# Patient Record
Sex: Male | Born: 1950 | Race: Black or African American | Hispanic: No | Marital: Married | Smoking: Never smoker
Health system: Southern US, Community
[De-identification: ages and names within clinical notes are randomized; demographics above are authoritative.]

## PROBLEM LIST (undated history)

## (undated) DIAGNOSIS — I10 Essential (primary) hypertension: Secondary | ICD-10-CM

## (undated) HISTORY — DX: Essential (primary) hypertension: I10

---

## 2000-07-05 ENCOUNTER — Emergency Department (HOSPITAL_COMMUNITY): Admission: EM | Admit: 2000-07-05 | Discharge: 2000-07-05 | Payer: Self-pay | Admitting: Emergency Medicine

## 2002-09-17 ENCOUNTER — Emergency Department (HOSPITAL_COMMUNITY): Admission: EM | Admit: 2002-09-17 | Discharge: 2002-09-17 | Payer: Self-pay | Admitting: Emergency Medicine

## 2002-09-26 ENCOUNTER — Emergency Department (HOSPITAL_COMMUNITY): Admission: EM | Admit: 2002-09-26 | Discharge: 2002-09-26 | Payer: Self-pay | Admitting: Emergency Medicine

## 2003-11-24 ENCOUNTER — Emergency Department (HOSPITAL_COMMUNITY): Admission: EM | Admit: 2003-11-24 | Discharge: 2003-11-24 | Payer: Self-pay | Admitting: Emergency Medicine

## 2004-09-20 ENCOUNTER — Emergency Department (HOSPITAL_COMMUNITY): Admission: EM | Admit: 2004-09-20 | Discharge: 2004-09-20 | Payer: Self-pay | Admitting: Emergency Medicine

## 2005-05-11 ENCOUNTER — Emergency Department (HOSPITAL_COMMUNITY): Admission: EM | Admit: 2005-05-11 | Discharge: 2005-05-11 | Payer: Self-pay | Admitting: Emergency Medicine

## 2005-07-30 ENCOUNTER — Emergency Department (HOSPITAL_COMMUNITY): Admission: EM | Admit: 2005-07-30 | Discharge: 2005-07-30 | Payer: Self-pay | Admitting: *Deleted

## 2014-07-17 ENCOUNTER — Ambulatory Visit (INDEPENDENT_AMBULATORY_CARE_PROVIDER_SITE_OTHER): Payer: Medicare PPO

## 2014-07-17 ENCOUNTER — Ambulatory Visit (INDEPENDENT_AMBULATORY_CARE_PROVIDER_SITE_OTHER): Payer: Medicare PPO | Admitting: Family Medicine

## 2014-07-17 DIAGNOSIS — S39012A Strain of muscle, fascia and tendon of lower back, initial encounter: Secondary | ICD-10-CM

## 2014-07-17 MED ORDER — METHOCARBAMOL 500 MG PO TABS: 500.0000 mg | ORAL_TABLET | Freq: Three times a day (TID) | ORAL | Status: DC | PRN
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
## 2014-07-17 NOTE — Patient Instructions (Signed)
I hope that you feel better soon!  Use the robaxin as needed for your back pain Get gentle exercise, and try some easy stretches Remember the robaxin may make you feel sleepy- avoid driving if you feel sleepy Let me know if you are not feeling better in the next 2-3 days- Sooner if worse.

## 2014-07-19 ENCOUNTER — Telehealth: Payer: Self-pay

## 2014-07-19 DIAGNOSIS — M62838 Other muscle spasm: Secondary | ICD-10-CM

## 2014-07-19 MED ORDER — TIZANIDINE HCL 2 MG PO CAPS: 2.0000 mg | ORAL_CAPSULE | Freq: Three times a day (TID) | ORAL | Status: DC | PRN
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
## 2014-07-19 NOTE — Telephone Encounter (Signed)
Called pt- counseled that his insurance does not cover robaxin.  The cash price may be quite inexpensive, so he could consider just paying for the drug.  Alternatively he can try zanaflex which is covered- whichever is cheaper.  Will send in the zanaflex as well and he can compare prices

## 2014-07-20 NOTE — Telephone Encounter (Signed)
PA for methocarbamol ended up getting approved by [facility]. Notified pharm and asked them to fill this instead of zanaflex (which also needed a PA) and let pt know when ready.

## 2018-05-29 ENCOUNTER — Inpatient Hospital Stay (HOSPITAL_COMMUNITY): Payer: Medicare Other

## 2018-05-29 ENCOUNTER — Inpatient Hospital Stay (HOSPITAL_COMMUNITY)
Admission: EM | Admit: 2018-05-29 | Discharge: 2018-06-05 | DRG: 062 | Disposition: A | Discharge status: Rehabilitation Facility | Payer: Medicare Other | Attending: Neurology | Admitting: Neurology

## 2018-05-29 ENCOUNTER — Emergency Department (HOSPITAL_COMMUNITY): Payer: Medicare Other

## 2018-05-29 ENCOUNTER — Other Ambulatory Visit: Payer: Self-pay

## 2018-05-29 ENCOUNTER — Encounter (HOSPITAL_COMMUNITY): Payer: Self-pay | Admitting: Emergency Medicine

## 2018-05-29 DIAGNOSIS — I63512 Cerebral infarction due to unspecified occlusion or stenosis of left middle cerebral artery: Secondary | ICD-10-CM | POA: Diagnosis present

## 2018-05-29 DIAGNOSIS — E1165 Type 2 diabetes mellitus with hyperglycemia: Secondary | ICD-10-CM | POA: Diagnosis present

## 2018-05-29 DIAGNOSIS — I161 Hypertensive emergency: Secondary | ICD-10-CM | POA: Diagnosis present

## 2018-05-29 DIAGNOSIS — Z79899 Other long term (current) drug therapy: Secondary | ICD-10-CM | POA: Diagnosis not present

## 2018-05-29 DIAGNOSIS — R471 Dysarthria and anarthria: Secondary | ICD-10-CM | POA: Diagnosis present

## 2018-05-29 DIAGNOSIS — I63413 Cerebral infarction due to embolism of bilateral middle cerebral arteries: Secondary | ICD-10-CM | POA: Diagnosis not present

## 2018-05-29 DIAGNOSIS — I651 Occlusion and stenosis of basilar artery: Secondary | ICD-10-CM | POA: Diagnosis present

## 2018-05-29 DIAGNOSIS — R1310 Dysphagia, unspecified: Secondary | ICD-10-CM | POA: Diagnosis present

## 2018-05-29 DIAGNOSIS — I679 Cerebrovascular disease, unspecified: Secondary | ICD-10-CM

## 2018-05-29 DIAGNOSIS — Z23 Encounter for immunization: Secondary | ICD-10-CM | POA: Diagnosis present

## 2018-05-29 DIAGNOSIS — G479 Sleep disorder, unspecified: Secondary | ICD-10-CM | POA: Diagnosis not present

## 2018-05-29 DIAGNOSIS — I63313 Cerebral infarction due to thrombosis of bilateral middle cerebral arteries: Secondary | ICD-10-CM | POA: Diagnosis not present

## 2018-05-29 DIAGNOSIS — R4702 Dysphasia: Secondary | ICD-10-CM | POA: Diagnosis present

## 2018-05-29 DIAGNOSIS — R269 Unspecified abnormalities of gait and mobility: Secondary | ICD-10-CM | POA: Diagnosis not present

## 2018-05-29 DIAGNOSIS — R9082 White matter disease, unspecified: Secondary | ICD-10-CM | POA: Diagnosis present

## 2018-05-29 DIAGNOSIS — Z1159 Encounter for screening for other viral diseases: Secondary | ICD-10-CM | POA: Diagnosis not present

## 2018-05-29 DIAGNOSIS — I10 Essential (primary) hypertension: Secondary | ICD-10-CM | POA: Diagnosis present

## 2018-05-29 DIAGNOSIS — E871 Hypo-osmolality and hyponatremia: Secondary | ICD-10-CM | POA: Diagnosis not present

## 2018-05-29 DIAGNOSIS — I6381 Other cerebral infarction due to occlusion or stenosis of small artery: Secondary | ICD-10-CM

## 2018-05-29 DIAGNOSIS — I69351 Hemiplegia and hemiparesis following cerebral infarction affecting right dominant side: Secondary | ICD-10-CM | POA: Diagnosis not present

## 2018-05-29 DIAGNOSIS — F1010 Alcohol abuse, uncomplicated: Secondary | ICD-10-CM | POA: Diagnosis present

## 2018-05-29 DIAGNOSIS — I674 Hypertensive encephalopathy: Secondary | ICD-10-CM | POA: Diagnosis present

## 2018-05-29 DIAGNOSIS — I959 Hypotension, unspecified: Secondary | ICD-10-CM | POA: Diagnosis present

## 2018-05-29 DIAGNOSIS — R739 Hyperglycemia, unspecified: Secondary | ICD-10-CM | POA: Diagnosis not present

## 2018-05-29 DIAGNOSIS — F1011 Alcohol abuse, in remission: Secondary | ICD-10-CM | POA: Diagnosis not present

## 2018-05-29 DIAGNOSIS — K5901 Slow transit constipation: Secondary | ICD-10-CM | POA: Diagnosis not present

## 2018-05-29 DIAGNOSIS — R29810 Facial weakness: Secondary | ICD-10-CM | POA: Diagnosis present

## 2018-05-29 DIAGNOSIS — I672 Cerebral atherosclerosis: Secondary | ICD-10-CM | POA: Diagnosis present

## 2018-05-29 DIAGNOSIS — I639 Cerebral infarction, unspecified: Secondary | ICD-10-CM | POA: Diagnosis present

## 2018-05-29 DIAGNOSIS — E119 Type 2 diabetes mellitus without complications: Secondary | ICD-10-CM

## 2018-05-29 DIAGNOSIS — I69398 Other sequelae of cerebral infarction: Secondary | ICD-10-CM | POA: Diagnosis not present

## 2018-05-29 DIAGNOSIS — R0989 Other specified symptoms and signs involving the circulatory and respiratory systems: Secondary | ICD-10-CM | POA: Diagnosis not present

## 2018-05-29 DIAGNOSIS — E785 Hyperlipidemia, unspecified: Secondary | ICD-10-CM | POA: Diagnosis not present

## 2018-05-29 DIAGNOSIS — G8191 Hemiplegia, unspecified affecting right dominant side: Secondary | ICD-10-CM | POA: Diagnosis present

## 2018-05-29 DIAGNOSIS — R7309 Other abnormal glucose: Secondary | ICD-10-CM | POA: Diagnosis not present

## 2018-05-29 DIAGNOSIS — E1159 Type 2 diabetes mellitus with other circulatory complications: Secondary | ICD-10-CM | POA: Diagnosis not present

## 2018-05-29 DIAGNOSIS — Z7982 Long term (current) use of aspirin: Secondary | ICD-10-CM | POA: Diagnosis not present

## 2018-05-29 LAB — URINALYSIS, ROUTINE W REFLEX MICROSCOPIC
Bilirubin Urine: NEGATIVE
Glucose, UA: 50 mg/dL — AB
Ketones, ur: NEGATIVE mg/dL
Leukocytes,Ua: NEGATIVE
Nitrite: NEGATIVE
Protein, ur: 100 mg/dL — AB
Specific Gravity, Urine: 1.015 (ref 1.005–1.030)
pH: 5 (ref 5.0–8.0)

## 2018-05-29 LAB — CBG MONITORING, ED: Glucose-Capillary: 172 mg/dL — ABNORMAL HIGH (ref 70–99)

## 2018-05-29 LAB — COMPREHENSIVE METABOLIC PANEL
ALT: 23 U/L (ref 0–44)
AST: 20 U/L (ref 15–41)
Albumin: 4.2 g/dL (ref 3.5–5.0)
Alkaline Phosphatase: 61 U/L (ref 38–126)
Anion gap: 14 (ref 5–15)
BUN: 12 mg/dL (ref 8–23)
CO2: 25 mmol/L (ref 22–32)
Calcium: 10.1 mg/dL (ref 8.9–10.3)
Chloride: 96 mmol/L — ABNORMAL LOW (ref 98–111)
Creatinine, Ser: 1.36 mg/dL — ABNORMAL HIGH (ref 0.61–1.24)
GFR calc Af Amer: >60 mL/min (ref >60)
GFR calc non Af Amer: 53 mL/min — ABNORMAL LOW (ref >60)
Glucose, Bld: 190 mg/dL — ABNORMAL HIGH (ref 70–99)
Potassium: 3.5 mmol/L (ref 3.5–5.1)
Sodium: 135 mmol/L (ref 135–145)
Total Bilirubin: 0.9 mg/dL (ref 0.3–1.2)
Total Protein: 7.2 g/dL (ref 6.5–8.1)

## 2018-05-29 LAB — RAPID URINE DRUG SCREEN, HOSP PERFORMED
Amphetamines: NOT DETECTED
Barbiturates: NOT DETECTED
Benzodiazepines: NOT DETECTED
Cocaine: NOT DETECTED
Opiates: NOT DETECTED
Tetrahydrocannabinol: NOT DETECTED

## 2018-05-29 LAB — HIV ANTIBODY (ROUTINE TESTING W REFLEX): HIV Screen 4th Generation wRfx: NONREACTIVE

## 2018-05-29 LAB — CBC
HCT: 43.8 % (ref 39.0–52.0)
Hemoglobin: 14.9 g/dL (ref 13.0–17.0)
MCH: 28.2 pg (ref 26.0–34.0)
MCHC: 34 g/dL (ref 30.0–36.0)
MCV: 83 fL (ref 80.0–100.0)
Platelets: 278 10*3/uL (ref 150–400)
RBC: 5.28 MIL/uL (ref 4.22–5.81)
RDW: 12.8 % (ref 11.5–15.5)
WBC: 5.9 10*3/uL (ref 4.0–10.5)
nRBC: 0 % (ref 0.0–0.2)

## 2018-05-29 LAB — DIFFERENTIAL
Abs Immature Granulocytes: 0.02 10*3/uL (ref 0.00–0.07)
Basophils Absolute: 0 10*3/uL (ref 0.0–0.1)
Basophils Relative: 1 %
Eosinophils Absolute: 0.2 10*3/uL (ref 0.0–0.5)
Eosinophils Relative: 4 %
Immature Granulocytes: 0 %
Lymphocytes Relative: 40 %
Lymphs Abs: 2.4 10*3/uL (ref 0.7–4.0)
Monocytes Absolute: 0.6 10*3/uL (ref 0.1–1.0)
Monocytes Relative: 10 %
Neutro Abs: 2.7 10*3/uL (ref 1.7–7.7)
Neutrophils Relative %: 45 %

## 2018-05-29 LAB — GLUCOSE, CAPILLARY
Glucose-Capillary: 161 mg/dL — ABNORMAL HIGH (ref 70–99)
Glucose-Capillary: 147 mg/dL — ABNORMAL HIGH (ref 70–99)

## 2018-05-29 LAB — I-STAT TROPONIN, ED: Troponin i, poc: 0 ng/mL (ref 0.00–0.08)

## 2018-05-29 LAB — ETHANOL: Alcohol, Ethyl (B): <10 mg/dL (ref <10)

## 2018-05-29 LAB — I-STAT CREATININE, ED: Creatinine, Ser: 1.2 mg/dL (ref 0.61–1.24)

## 2018-05-29 LAB — PROTIME-INR
INR: 1 (ref 0.8–1.2)
Prothrombin Time: 13.5 seconds (ref 11.4–15.2)

## 2018-05-29 LAB — APTT: aPTT: 32 seconds (ref 24–36)

## 2018-05-29 LAB — SARS CORONAVIRUS 2 BY RT PCR (HOSPITAL ORDER, PERFORMED IN ~~LOC~~ HOSPITAL LAB): SARS Coronavirus 2: NEGATIVE

## 2018-05-29 MED ORDER — LABETALOL HCL 5 MG/ML IV SOLN
20.0000 mg | Freq: Once | INTRAVENOUS | Status: AC
  Administered 2018-05-29: 20 mg via INTRAVENOUS

## 2018-05-29 MED ORDER — ACETAMINOPHEN 160 MG/5ML PO SOLN: 650.0000 mg | ORAL | Status: DC | PRN

## 2018-05-29 MED ORDER — METOPROLOL SUCCINATE ER 50 MG PO TB24: 100.0000 mg | ORAL_TABLET | Freq: Every day | ORAL | Status: DC

## 2018-05-29 MED ORDER — CLEVIDIPINE BUTYRATE 0.5 MG/ML IV EMUL
0.0000 mg/h | INTRAVENOUS | Status: DC
  Administered 2018-05-29 (×4): 6 mg/h via INTRAVENOUS
  Administered 2018-05-29: 2 mg/h via INTRAVENOUS
  Administered 2018-05-30 (×4): 7 mg/h via INTRAVENOUS
  Filled 2018-05-29 (×2): qty 50
  Filled 2018-05-29 (×3): qty 100

## 2018-05-29 MED ORDER — ALTEPLASE (STROKE) FULL DOSE INFUSION
0.9000 mg/kg | Freq: Once | INTRAVENOUS | Status: AC
  Administered 2018-05-29: 76.9 mg via INTRAVENOUS
  Filled 2018-05-29: qty 100

## 2018-05-29 MED ORDER — ACETAMINOPHEN 325 MG PO TABS: 650.0000 mg | ORAL_TABLET | ORAL | Status: DC | PRN

## 2018-05-29 MED ORDER — SENNOSIDES-DOCUSATE SODIUM 8.6-50 MG PO TABS: 1.0000 | ORAL_TABLET | Freq: Every evening | ORAL | Status: DC | PRN

## 2018-05-29 MED ORDER — PANTOPRAZOLE SODIUM 40 MG IV SOLR
40.0000 mg | Freq: Every day | INTRAVENOUS | Status: DC
  Administered 2018-05-29: 40 mg via INTRAVENOUS
  Filled 2018-05-29: qty 40

## 2018-05-29 MED ORDER — ACETAMINOPHEN 650 MG RE SUPP: 650.0000 mg | RECTAL | Status: DC | PRN

## 2018-05-29 MED ORDER — INSULIN ASPART 100 UNIT/ML ~~LOC~~ SOLN
0.0000 [IU] | Freq: Three times a day (TID) | SUBCUTANEOUS | Status: DC
  Administered 2018-05-29 – 2018-05-30 (×2): 2 [IU] via SUBCUTANEOUS
  Administered 2018-05-30: 3 [IU] via SUBCUTANEOUS
  Administered 2018-05-30 – 2018-05-31 (×2): 2 [IU] via SUBCUTANEOUS
  Administered 2018-05-31: 1 [IU] via SUBCUTANEOUS
  Administered 2018-05-31 – 2018-06-01 (×3): 2 [IU] via SUBCUTANEOUS
  Administered 2018-06-01 – 2018-06-02 (×2): 1 [IU] via SUBCUTANEOUS
  Administered 2018-06-02 (×2): 2 [IU] via SUBCUTANEOUS

## 2018-05-29 MED ORDER — IOHEXOL 350 MG/ML SOLN
75.0000 mL | Freq: Once | INTRAVENOUS | Status: AC | PRN
  Administered 2018-05-29: 75 mL via INTRAVENOUS

## 2018-05-29 MED ORDER — STROKE: EARLY STAGES OF RECOVERY BOOK
Freq: Once | Status: DC
  Filled 2018-05-29: qty 1

## 2018-05-29 MED ORDER — SODIUM CHLORIDE 0.9 % IV SOLN
INTRAVENOUS | Status: DC
  Administered 2018-05-29 – 2018-05-30 (×2): via INTRAVENOUS

## 2018-05-29 NOTE — ED Notes (Signed)
ED TO INPATIENT HANDOFF REPORT  ED Nurse Name and Phone #: [phone], Tray [place]  S Name/Age/Gender Joe Wood 68 y.o. male Room/Bed: 025C/025C  Code Status   Code Status: Full Code  Home/SNF/Other Home Patient oriented to: self, place, time and situation Is this baseline? Yes   Triage Complete: Triage complete  Chief Complaint code stroke   Triage Note Pt arrived via [facility]; pt from [facility]; Pt woke up between 5-510a and attempted to walk to bathroom, wife heard collapse. Pt presented with slurred speech, R sided wkness, and facial droop. Pt only takes ASA 81mg ; 240/140 initially and currently 232/142; 97% on RA; CBG 115, 106 HR   Allergies No Known Allergies  Level of Care/Admitting Diagnosis ED Disposition    ED Disposition Condition Comment   Admit  Hospital Area: [facility] [[id number]]  Level of Care: ICU [6]  Covid Evaluation: Screening Protocol (No Symptoms)  Diagnosis: Acute ischemic stroke [facility]) [[id number]]  Admitting Physician: Milon Dikes [[id number]]  Attending Physician: Milon Dikes [[id number]]  Estimated length of stay: 3 - 4 days  Certification:: I certify this patient will need inpatient services for at least 2 midnights  PT Class (Do Not Modify): Inpatient [101]  PT Acc Code (Do Not Modify): Private [1]       B Medical/Surgery History Past Medical History:  Diagnosis Date  . Hypertension    History reviewed. No pertinent surgical history.   A IV Location/Drains/Wounds Patient Lines/Drains/Airways Status   Active Line/Drains/Airways    Name:   Placement date:   Placement time:   Site:   Days:   Peripheral IV 05/29/18 Right Forearm   05/29/18    0617    Forearm   less than 1   Peripheral IV 05/29/18 Right Hand   05/29/18    0624    Hand   less than 1   Peripheral IV 05/29/18 Left Antecubital   05/29/18    3428    Antecubital   less than 1          Intake/Output Last 24 hours No intake or output data in the 24 hours ending  05/29/18 7681  Labs/Imaging Results for orders placed or performed during the hospital encounter of 05/29/18 (from the past 48 hour(s))  CBG monitoring, ED     Status: Abnormal   Collection Time: 05/29/18  6:02 AM  Result Value Ref Range   Glucose-Capillary 172 (H) 70 - 99 mg/dL  Protime-INR     Status: None   Collection Time: 05/29/18  6:06 AM  Result Value Ref Range   Prothrombin Time 13.5 11.4 - 15.2 seconds   INR 1.0 0.8 - 1.2    Comment: (NOTE) INR goal varies based on device and disease states. Performed at [facility], [address], [place], [place] [address]   APTT     Status: None   Collection Time: 05/29/18  6:06 AM  Result Value Ref Range   aPTT 32 24 - 36 seconds    Comment: Performed at [facility], [address], [place], [place] [address]  CBC     Status: None   Collection Time: 05/29/18  6:06 AM  Result Value Ref Range   WBC 5.9 4.0 - 10.5 K/uL   RBC 5.28 4.22 - 5.81 MIL/uL   Hemoglobin 14.9 13.0 - 17.0 g/dL   HCT 97.4 16.3 - 84.5 %   MCV 83.0 80.0 - 100.0 fL   MCH 28.2 26.0 - 34.0  pg   MCHC 34.0 30.0 - 36.0 g/dL   RDW 16.112.8 09.611.5 - 04.515.5 %   Platelets 278 150 - 400 K/uL   nRBC 0.0 0.0 - 0.2 %    Comment: Performed at [facility], [address] [place], [place] [address]  Differential     Status: None   Collection Time: 05/29/18  6:06 AM  Result Value Ref Range   Neutrophils Relative % 45 %   Neutro Abs 2.7 1.7 - 7.7 K/uL   Lymphocytes Relative 40 %   Lymphs Abs 2.4 0.7 - 4.0 K/uL   Monocytes Relative 10 %   Monocytes Absolute 0.6 0.1 - 1.0 K/uL   Eosinophils Relative 4 %   Eosinophils Absolute 0.2 0.0 - 0.5 K/uL   Basophils Relative 1 %   Basophils Absolute 0.0 0.0 - 0.1 K/uL   Immature Granulocytes 0 %   Abs Immature Granulocytes 0.02 0.00 - 0.07 K/uL    Comment: Performed at [facility], [address] [place], [place] [address]  I-Stat Troponin, ED (not at [facility])     Status: None   Collection Time: 05/29/18  6:15  AM  Result Value Ref Range   Troponin i, poc 0.00 0.00 - 0.08 ng/mL   Comment 3            Comment: Due to the release kinetics of cTnI, a negative result within the first hours of the onset of symptoms does not rule out myocardial infarction with certainty. If myocardial infarction is still suspected, repeat the test at appropriate intervals.   I-stat Creatinine, ED     Status: None   Collection Time: 05/29/18  6:17 AM  Result Value Ref Range   Creatinine, Ser 1.20 0.61 - 1.24 mg/dL   Ct Head Code Stroke Wo Contrast  Result Date: 05/29/2018 CLINICAL DATA:  Code stroke. Right-sided deficits. Elevated blood pressure EXAM: CT HEAD WITHOUT CONTRAST TECHNIQUE: Contiguous axial images were obtained from the base of the skull through the vertex without intravenous contrast. COMPARISON:  None. FINDINGS: Brain: Lacunar infarct in the left thalamus, age-indeterminate based on density. Small remote right cerebellar infarct. Chronic small vessel ischemic type change in the cerebral white matter. Symmetric atrophy. No hydrocephalus or masslike finding. Vascular: No hyperdense vessel. Skull: Negative Sinuses/Orbits: Negative Other: Motion degraded. These results were communicated to Dr. Wilford CornerArora at [facility]6:13 amon 5/7/2020by text page via the [facility] messaging system. ASPECTS (Alberta Stroke Program Early CT Score) - Ganglionic level infarction (caudate, lentiform nuclei, internal capsule, insula, M1-M3 cortex): 7 - Supraganglionic infarction (M4-M6 cortex): 3 Total score (0-10 with 10 being normal): 10 IMPRESSION: 1. Age-indeterminate lacunar infarct in the left thalamus. 2. Negative for acute hemorrhage. 3. Motion degraded. Electronically Signed   By: Marnee SpringJonathon  Watts M.D.   On: 05/29/2018 06:15    Pending Labs Unresulted Labs (From admission, onward)    Start     Ordered   05/30/18 0500  Hemoglobin A1c  Tomorrow morning,   R     05/29/18 0616   05/30/18 0500  Lipid panel  Tomorrow morning,   R    Comments:   Fasting    05/29/18 0616   05/29/18 0631  SARS Coronavirus 2 (CEPHEID - Performed in [facility] lab), Hosp Order  (Asymptomatic Patients Labs)  Once,   R    Question:  Rule Out  Answer:  Yes   05/29/18 0630   05/29/18 0615  HIV antibody (Routine Testing)  Once,  R     05/29/18 0616   05/29/18 0602  Ethanol  ONCE - STAT,   STAT     05/29/18 0602   05/29/18 0602  Comprehensive metabolic panel  (Stroke Panel (PNL))  ONCE - STAT,   STAT     05/29/18 0602   05/29/18 0602  Urine rapid drug screen (hosp performed)  ONCE - STAT,   STAT     05/29/18 0602   05/29/18 0602  Urinalysis, Routine w reflex microscopic  ONCE - STAT,   STAT     05/29/18 0602          Vitals/Pain Today's Vitals   05/29/18 0626 05/29/18 0626 05/29/18 0633 05/29/18 0634  BP:  (!) 169/93  (!) 153/84  Pulse:      Resp:      Temp: 99.8 F (37.7 C)     TempSrc:      SpO2:      Weight:      PainSc:   0-No pain     Isolation Precautions No active isolations  Medications Medications  alteplase (ACTIVASE) 1 mg/mL infusion 76.9 mg (76.9 mg Intravenous New Bag/Given 05/29/18 1610)   stroke: mapping our early stages of recovery book (has no administration in time range)  0.9 %  sodium chloride infusion (has no administration in time range)  acetaminophen (TYLENOL) tablet 650 mg (has no administration in time range)    Or  acetaminophen (TYLENOL) solution 650 mg (has no administration in time range)    Or  acetaminophen (TYLENOL) suppository 650 mg (has no administration in time range)  senna-docusate (Senokot-S) tablet 1 tablet (has no administration in time range)  pantoprazole (PROTONIX) injection 40 mg (has no administration in time range)  labetalol (NORMODYNE) injection 20 mg (20 mg Intravenous Given 05/29/18 0613)    And  clevidipine (CLEVIPREX) infusion 0.5 mg/mL (16 mg/hr Intravenous Rate/Dose Change 05/29/18 0634)  iohexol (OMNIPAQUE) 350 MG/ML injection 75 mL (75 mLs Intravenous Contrast Given  05/29/18 0617)    Mobility walks Low fall risk   Focused Assessments Neuro Assessment Handoff:  Swallow screen pass? No          Neuro Assessment:   Neuro Checks:      Last Documented NIHSS Modified Score:   Has TPA been given? Yes Temp: 99.8 F (37.7 C) (05/07 0626) Temp Source: Oral (05/07 0617) BP: 153/84 (05/07 9604) Pulse Rate: 90 (05/07 0619) If patient is a Neuro Trauma and patient is going to OR before floor call report to 4N Charge nurse: [phone] or [phone]     R Recommendations: See Admitting Provider Note  Report given to:   Additional Notes:

## 2018-05-29 NOTE — ED Provider Notes (Signed)
[facility] NEURO/TRAUMA/SURGICAL ICU Provider Note  CSN: [id number] Arrival date & time: 05/29/18 0600  Chief Complaint(s) Code Stroke  HPI Joe Wood is a 68 y.o. male with a history of hypertension who presents to the emergency department with sudden onset right-sided weakness that began at approximately 5:25 AM.  Patient reports waking around 5:10 this morning in his normal state of health.  Denies any recent fevers or infections.  No chest pain or shortness of breath.  No abdominal pain.  No other reported symptoms.  EMS called who initiated code stroke in route.  Patient noted to be hypertensive with systolics of 240.    HPI  Past Medical History Past Medical History:  Diagnosis Date   Hypertension    Patient Active Problem List   Diagnosis Date Noted   Acute ischemic stroke (HCC) 05/29/2018   Home Medication(s) Prior to Admission medications   Medication Sig Start Date End Date Taking? Authorizing Provider  aspirin 81 MG tablet Take 81 mg by mouth daily.    [provider]  atorvastatin (LIPITOR) 40 MG tablet Take 40 mg by mouth daily.    [provider]  methocarbamol (ROBAXIN) 500 MG tablet Take 1 tablet (500 mg total) by mouth every 8 (eight) hours as needed. 07/17/14   Copland, Gwenlyn Found, MD  Olmesartan-Amlodipine-HCTZ (TRIBENZOR PO) Take by mouth.    [provider]  tizanidine (ZANAFLEX) 2 MG capsule Take 1 capsule (2 mg total) by mouth 3 (three) times daily as needed for muscle spasms. 07/19/14   Copland, Gwenlyn Found, MD                                                                                                                                    Past Surgical History ** The histories are not reviewed yet. Please review them in the "History" navigator section and refresh this SmartLink. Family History Family History  Problem Relation Age of Onset   Cancer Father     Social History Social History   Tobacco Use   Smoking  status: Never Smoker  Substance Use Topics   Alcohol use: Yes    Alcohol/week: 0.0 standard drinks   Drug use: No   Allergies Patient has no known allergies.  Review of Systems Review of Systems All other systems are reviewed and are negative for acute change except as noted in the HPI  Physical Exam Vital Signs  I have reviewed the triage vital signs BP (!) 244/123 (BP Location: Right Arm)    Pulse (!) 105    Temp 99.8 F (37.7 C) (Oral)    Resp 20    Wt 85.4 kg    SpO2 97%    BMI 32.32 kg/m   Physical Exam Vitals signs reviewed.  Constitutional:      General: He is not in acute distress.    Appearance: He is well-developed. He is not diaphoretic.  HENT:  Head: Normocephalic and atraumatic.     Nose: Nose normal.  Eyes:     General: No scleral icterus.       Right eye: No discharge.        Left eye: No discharge.     Conjunctiva/sclera: Conjunctivae normal.     Pupils: Pupils are equal, round, and reactive to light.  Neck:     Musculoskeletal: Normal range of motion and neck supple.  Cardiovascular:     Rate and Rhythm: Normal rate and regular rhythm.     Heart sounds: No murmur. No friction rub. No gallop.   Pulmonary:     Effort: Pulmonary effort is normal. No respiratory distress.     Breath sounds: Normal breath sounds. No stridor. No rales.  Abdominal:     General: There is no distension.     Palpations: Abdomen is soft.     Tenderness: There is no abdominal tenderness.  Musculoskeletal:        General: No tenderness.  Skin:    General: Skin is warm and dry.     Findings: No erythema or rash.  Neurological:     Mental Status: He is alert and oriented to person, place, and time.     Comments: Right-sided deficits.  Detailed neuro exam by neurology.     ED Results and Treatments Labs (all labs ordered are listed, but only abnormal results are displayed) Labs Reviewed  COMPREHENSIVE METABOLIC PANEL - Abnormal; Notable for the following components:        Result Value   Chloride 96 (*)    Glucose, Bld 190 (*)    Creatinine, Ser 1.36 (*)    GFR calc non Af Amer 53 (*)    All other components within normal limits  CBG MONITORING, ED - Abnormal; Notable for the following components:   Glucose-Capillary 172 (*)    All other components within normal limits  SARS CORONAVIRUS 2 (HOSPITAL ORDER, PERFORMED IN [facility])  ETHANOL  PROTIME-INR  APTT  CBC  DIFFERENTIAL  RAPID URINE DRUG SCREEN, HOSP PERFORMED  URINALYSIS, ROUTINE W REFLEX MICROSCOPIC  HIV ANTIBODY (ROUTINE TESTING W REFLEX)  I-STAT CREATININE, ED  I-STAT TROPONIN, ED                                                                                                                         EKG  EKG Interpretation  Date/Time:    Ventricular Rate:    PR Interval:    QRS Duration:   QT Interval:    QTC Calculation:   R Axis:     Text Interpretation:        Radiology Ct Angio Head W Or Wo Contrast  Result Date: 05/29/2018 CLINICAL DATA:  Right-sided weakness EXAM: CT ANGIOGRAPHY HEAD AND NECK TECHNIQUE: Multidetector CT imaging of the head and neck was performed using the standard protocol during bolus administration of intravenous contrast. Multiplanar CT image reconstructions and MIPs were obtained to evaluate the  vascular anatomy. Carotid stenosis measurements (when applicable) are obtained utilizing NASCET criteria, using the distal internal carotid diameter as the denominator. CONTRAST:  75mL OMNIPAQUE IOHEXOL 350 MG/ML SOLN COMPARISON:  Head CT earlier today FINDINGS: CTA NECK FINDINGS Aortic arch: Atheromatous plaque.  No acute finding Right carotid system: Moderate calcified plaque at the bifurcation without flow limiting stenosis or ulceration. Left carotid system: Calcified plaque at the bifurcation without flow limiting stenosis or ulceration. Vertebral arteries: No proximal subclavian stenosis. No flow is seen throughout the left vertebral artery. The  right vertebral artery is smooth and widely patent to the dura. Skeleton: No acute or aggressive finding Other neck: No acute finding Upper chest: Negative Review of the MIP images confirms the above findings CTA HEAD FINDINGS Anterior circulation: Due to motion requiring repeat imaging there is suboptimal bolus density. Atherosclerotic plaque on the carotid siphons. No proximal flow limiting stenosis or major branch occlusion is seen. Probable medium vessel irregularity from atherosclerosis, but limited by bolus quality. Posterior circulation: No flow is seen within the left vertebral artery until the intradural segment where faint flow is seen. Small basilar in the setting of bilateral fetal type PCA. There is a mid basilar severe stenosis with apparent flow gap. Advanced narrowing of the right PCA at the P2 segment. Atheromatous irregularity of the left P2/3 segment. Venous sinuses: Patent when accounting for arachnoid granulations. Anatomic variants: As above Delayed phase: Not obtained Review of the MIP images confirms the above findings IMPRESSION: 1. No emergent large vessel occlusion. 2. Small basilar in the setting of bilateral fetal type PCA with severe stenosis of the mid basilar giving an apparent flow gap. No flow is seen within the left vertebral artery until the distal V4 segment. 3. Cervical and intracranial carotid atherosclerosis without flow limiting stenosis. Electronically Signed   By: Marnee Spring M.D.   On: 05/29/2018 06:51   Ct Angio Neck W Or Wo Contrast  Result Date: 05/29/2018 CLINICAL DATA:  Right-sided weakness EXAM: CT ANGIOGRAPHY HEAD AND NECK TECHNIQUE: Multidetector CT imaging of the head and neck was performed using the standard protocol during bolus administration of intravenous contrast. Multiplanar CT image reconstructions and MIPs were obtained to evaluate the vascular anatomy. Carotid stenosis measurements (when applicable) are obtained utilizing NASCET criteria, using  the distal internal carotid diameter as the denominator. CONTRAST:  75mL OMNIPAQUE IOHEXOL 350 MG/ML SOLN COMPARISON:  Head CT earlier today FINDINGS: CTA NECK FINDINGS Aortic arch: Atheromatous plaque.  No acute finding Right carotid system: Moderate calcified plaque at the bifurcation without flow limiting stenosis or ulceration. Left carotid system: Calcified plaque at the bifurcation without flow limiting stenosis or ulceration. Vertebral arteries: No proximal subclavian stenosis. No flow is seen throughout the left vertebral artery. The right vertebral artery is smooth and widely patent to the dura. Skeleton: No acute or aggressive finding Other neck: No acute finding Upper chest: Negative Review of the MIP images confirms the above findings CTA HEAD FINDINGS Anterior circulation: Due to motion requiring repeat imaging there is suboptimal bolus density. Atherosclerotic plaque on the carotid siphons. No proximal flow limiting stenosis or major branch occlusion is seen. Probable medium vessel irregularity from atherosclerosis, but limited by bolus quality. Posterior circulation: No flow is seen within the left vertebral artery until the intradural segment where faint flow is seen. Small basilar in the setting of bilateral fetal type PCA. There is a mid basilar severe stenosis with apparent flow gap. Advanced narrowing of the right PCA at the P2 segment.  Atheromatous irregularity of the left P2/3 segment. Venous sinuses: Patent when accounting for arachnoid granulations. Anatomic variants: As above Delayed phase: Not obtained Review of the MIP images confirms the above findings IMPRESSION: 1. No emergent large vessel occlusion. 2. Small basilar in the setting of bilateral fetal type PCA with severe stenosis of the mid basilar giving an apparent flow gap. No flow is seen within the left vertebral artery until the distal V4 segment. 3. Cervical and intracranial carotid atherosclerosis without flow limiting stenosis.  Electronically Signed   By: Marnee Spring M.D.   On: 05/29/2018 06:51   Ct Head Code Stroke Wo Contrast  Result Date: 05/29/2018 CLINICAL DATA:  Code stroke. Right-sided deficits. Elevated blood pressure EXAM: CT HEAD WITHOUT CONTRAST TECHNIQUE: Contiguous axial images were obtained from the base of the skull through the vertex without intravenous contrast. COMPARISON:  None. FINDINGS: Brain: Lacunar infarct in the left thalamus, age-indeterminate based on density. Small remote right cerebellar infarct. Chronic small vessel ischemic type change in the cerebral white matter. Symmetric atrophy. No hydrocephalus or masslike finding. Vascular: No hyperdense vessel. Skull: Negative Sinuses/Orbits: Negative Other: Motion degraded. These results were communicated to Dr. Wilford Corner at [facility] amon 5/7/2020by text page via the [facility] messaging system. ASPECTS ([place] Stroke Program Early CT Score) - Ganglionic level infarction (caudate, lentiform nuclei, internal capsule, insula, M1-M3 cortex): 7 - Supraganglionic infarction (M4-M6 cortex): 3 Total score (0-10 with 10 being normal): 10 IMPRESSION: 1. Age-indeterminate lacunar infarct in the left thalamus. 2. Negative for acute hemorrhage. 3. Motion degraded. Electronically Signed   By: Marnee Spring M.D.   On: 05/29/2018 06:15   Pertinent labs & imaging results that were available during my care of the patient were reviewed by me and considered in my medical decision making (see chart for details).  Medications Ordered in ED Medications  alteplase (ACTIVASE) 1 mg/mL infusion 76.9 mg (76.9 mg Intravenous New Bag/Given 05/29/18 1308)   stroke: mapping our early stages of recovery book (has no administration in time range)  0.9 %  sodium chloride infusion (has no administration in time range)  acetaminophen (TYLENOL) tablet 650 mg (has no administration in time range)    Or  acetaminophen (TYLENOL) solution 650 mg (has no administration in time range)    Or    acetaminophen (TYLENOL) suppository 650 mg (has no administration in time range)  senna-docusate (Senokot-S) tablet 1 tablet (has no administration in time range)  pantoprazole (PROTONIX) injection 40 mg (has no administration in time range)  labetalol (NORMODYNE) injection 20 mg (20 mg Intravenous Given 05/29/18 0613)    And  clevidipine (CLEVIPREX) infusion 0.5 mg/mL (6 mg/hr Intravenous Rate/Dose Change 05/29/18 0656)  iohexol (OMNIPAQUE) 350 MG/ML injection 75 mL (75 mLs Intravenous Contrast Given 05/29/18 0617)  Procedures Procedures CRITICAL CARE Performed by: Amadeo Garnet Oran Dillenburg Total critical care time: 30 minutes Critical care time was exclusive of separately billable procedures and treating other patients. Critical care was necessary to treat or prevent imminent or life-threatening deterioration. Critical care was time spent personally by me on the following activities: development of treatment plan with patient and/or surrogate as well as nursing, discussions with consultants, evaluation of patient's response to treatment, examination of patient, obtaining history from patient or surrogate, ordering and performing treatments and interventions, ordering and review of laboratory studies, ordering and review of radiographic studies, pulse oximetry and re-evaluation of patient's condition.   (including critical care time)  Medical Decision Making / ED Course I have reviewed the nursing notes for this encounter and the patient's prior records (if available in EHR or on provided paperwork).    Work-up concerning for acute ischemic stroke.  Patient is significantly hypertensive requiring antihypertensives.  Candidate for TPA.  Hypertension controlled and patient given TPA.  Admitted to neuro ICU for continued management.  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  Despite best efforts to proofread,  errors can occur which can change the documentation meaning.   Nira Conn, MD 05/29/18 [id number]

## 2018-05-29 NOTE — Progress Notes (Signed)
STROKE TEAM PROGRESS NOTE   INTERVAL HISTORY Patient lying in bed, awake alert, orientated, just finished 2D echo.  Still has right facial droop and right hemiparesis.  Vitals:   05/29/18 0800 05/29/18 0815 05/29/18 0830 05/29/18 0845  BP: (!) 172/104 (!) 163/97 (!) 151/93 (!) 154/96  Pulse: (!) 101 99 (!) 101 (!) 103  Resp: 12 17 15 11   Temp:      TempSrc:      SpO2: 95% 95% 96% 96%  Weight: 85.4 kg     Height: 5\' 8"  (1.727 m)       CBC:  Recent Labs  Lab 05/29/18 0606  WBC 5.9  NEUTROABS 2.7  HGB 14.9  HCT 43.8  MCV 83.0  PLT 278    Basic Metabolic Panel:  Recent Labs  Lab 05/29/18 0606 05/29/18 0617  NA 135  --   K 3.5  --   CL 96*  --   CO2 25  --   GLUCOSE 190*  --   BUN 12  --   CREATININE 1.36* 1.20  CALCIUM 10.1  --    Lipid Panel: No results found for: CHOL, TRIG, HDL, CHOLHDL, VLDL, LDLCALC HgbA1c: No results found for: HGBA1C Urine Drug Screen: No results found for: LABOPIA, COCAINSCRNUR, LABBENZ, AMPHETMU, THCU, LABBARB  Alcohol Level     Component Value Date/Time   ETH <10 05/29/2018 0606    IMAGING Ct Angio Head W Or Wo Contrast  Result Date: 05/29/2018 CLINICAL DATA:  Right-sided weakness EXAM: CT ANGIOGRAPHY HEAD AND NECK TECHNIQUE: Multidetector CT imaging of the head and neck was performed using the standard protocol during bolus administration of intravenous contrast. Multiplanar CT image reconstructions and MIPs were obtained to evaluate the vascular anatomy. Carotid stenosis measurements (when applicable) are obtained utilizing NASCET criteria, using the distal internal carotid diameter as the denominator. CONTRAST:  75mL OMNIPAQUE IOHEXOL 350 MG/ML SOLN COMPARISON:  Head CT earlier today FINDINGS: CTA NECK FINDINGS Aortic arch: Atheromatous plaque.  No acute finding Right carotid system: Moderate calcified plaque at the bifurcation without flow limiting stenosis or ulceration. Left carotid system: Calcified plaque at the bifurcation without  flow limiting stenosis or ulceration. Vertebral arteries: No proximal subclavian stenosis. No flow is seen throughout the left vertebral artery. The right vertebral artery is smooth and widely patent to the dura. Skeleton: No acute or aggressive finding Other neck: No acute finding Upper chest: Negative Review of the MIP images confirms the above findings CTA HEAD FINDINGS Anterior circulation: Due to motion requiring repeat imaging there is suboptimal bolus density. Atherosclerotic plaque on the carotid siphons. No proximal flow limiting stenosis or major branch occlusion is seen. Probable medium vessel irregularity from atherosclerosis, but limited by bolus quality. Posterior circulation: No flow is seen within the left vertebral artery until the intradural segment where faint flow is seen. Small basilar in the setting of bilateral fetal type PCA. There is a mid basilar severe stenosis with apparent flow gap. Advanced narrowing of the right PCA at the P2 segment. Atheromatous irregularity of the left P2/3 segment. Venous sinuses: Patent when accounting for arachnoid granulations. Anatomic variants: As above Delayed phase: Not obtained Review of the MIP images confirms the above findings IMPRESSION: 1. No emergent large vessel occlusion. 2. Small basilar in the setting of bilateral fetal type PCA with severe stenosis of the mid basilar giving an apparent flow gap. No flow is seen within the left vertebral artery until the distal V4 segment. 3. Cervical and intracranial carotid atherosclerosis  without flow limiting stenosis. Electronically Signed   By: Marnee Spring M.D.   On: 05/29/2018 06:51   Ct Angio Neck W Or Wo Contrast  Result Date: 05/29/2018 CLINICAL DATA:  Right-sided weakness EXAM: CT ANGIOGRAPHY HEAD AND NECK TECHNIQUE: Multidetector CT imaging of the head and neck was performed using the standard protocol during bolus administration of intravenous contrast. Multiplanar CT image reconstructions and  MIPs were obtained to evaluate the vascular anatomy. Carotid stenosis measurements (when applicable) are obtained utilizing NASCET criteria, using the distal internal carotid diameter as the denominator. CONTRAST:  75mL OMNIPAQUE IOHEXOL 350 MG/ML SOLN COMPARISON:  Head CT earlier today FINDINGS: CTA NECK FINDINGS Aortic arch: Atheromatous plaque.  No acute finding Right carotid system: Moderate calcified plaque at the bifurcation without flow limiting stenosis or ulceration. Left carotid system: Calcified plaque at the bifurcation without flow limiting stenosis or ulceration. Vertebral arteries: No proximal subclavian stenosis. No flow is seen throughout the left vertebral artery. The right vertebral artery is smooth and widely patent to the dura. Skeleton: No acute or aggressive finding Other neck: No acute finding Upper chest: Negative Review of the MIP images confirms the above findings CTA HEAD FINDINGS Anterior circulation: Due to motion requiring repeat imaging there is suboptimal bolus density. Atherosclerotic plaque on the carotid siphons. No proximal flow limiting stenosis or major branch occlusion is seen. Probable medium vessel irregularity from atherosclerosis, but limited by bolus quality. Posterior circulation: No flow is seen within the left vertebral artery until the intradural segment where faint flow is seen. Small basilar in the setting of bilateral fetal type PCA. There is a mid basilar severe stenosis with apparent flow gap. Advanced narrowing of the right PCA at the P2 segment. Atheromatous irregularity of the left P2/3 segment. Venous sinuses: Patent when accounting for arachnoid granulations. Anatomic variants: As above Delayed phase: Not obtained Review of the MIP images confirms the above findings IMPRESSION: 1. No emergent large vessel occlusion. 2. Small basilar in the setting of bilateral fetal type PCA with severe stenosis of the mid basilar giving an apparent flow gap. No flow is seen  within the left vertebral artery until the distal V4 segment. 3. Cervical and intracranial carotid atherosclerosis without flow limiting stenosis. Electronically Signed   By: Marnee Spring M.D.   On: 05/29/2018 06:51   Ct Head Code Stroke Wo Contrast  Result Date: 05/29/2018 CLINICAL DATA:  Code stroke. Right-sided deficits. Elevated blood pressure EXAM: CT HEAD WITHOUT CONTRAST TECHNIQUE: Contiguous axial images were obtained from the base of the skull through the vertex without intravenous contrast. COMPARISON:  None. FINDINGS: Brain: Lacunar infarct in the left thalamus, age-indeterminate based on density. Small remote right cerebellar infarct. Chronic small vessel ischemic type change in the cerebral white matter. Symmetric atrophy. No hydrocephalus or masslike finding. Vascular: No hyperdense vessel. Skull: Negative Sinuses/Orbits: Negative Other: Motion degraded. These results were communicated to Dr. Wilford Corner at [facility] amon 5/7/2020by text page via the [facility] messaging system. ASPECTS (Alberta Stroke Program Early CT Score) - Ganglionic level infarction (caudate, lentiform nuclei, internal capsule, insula, M1-M3 cortex): 7 - Supraganglionic infarction (M4-M6 cortex): 3 Total score (0-10 with 10 being normal): 10 IMPRESSION: 1. Age-indeterminate lacunar infarct in the left thalamus. 2. Negative for acute hemorrhage. 3. Motion degraded. Electronically Signed   By: Marnee Spring M.D.   On: 05/29/2018 06:15    PHYSICAL EXAM  Temp:  [98 F (36.7 C)-99.8 F (37.7 C)] 98 F (36.7 C) (05/07 1158) Pulse Rate:  [90-111] 104 (  05/07 1300) Resp:  [11-21] 15 (05/07 1300) BP: (136-244)/(84-138) 166/104 (05/07 1300) SpO2:  [92 %-99 %] 95 % (05/07 1300) Weight:  [85.4 kg] 85.4 kg (05/07 0800)  General - Well nourished, well developed, in no apparent distress.  Ophthalmologic - fundi not visualized due to noncooperation.  Cardiovascular - Regular rate and rhythm.  Mental Status -  Level of arousal and  orientation to time, place, and person were intact. Language including expression, naming, repetition, comprehension was assessed and found intact.  Mild dysarthria Fund of Knowledge was assessed and was intact.  Cranial Nerves II - XII - II - Visual field intact OU. III, IV, VI - Extraocular movements intact. V - Facial sensation intact bilaterally. VII - right facial droop. VIII - Hearing & vestibular intact bilaterally. X - Palate elevates symmetrically, mild dysarthria. XI - Chin turning & shoulder shrug intact bilaterally. XII - Tongue protrusion intact.  Motor Strength - The patient's strength was normal in left upper and left lower extremities, however right upper extremity 0/5 deltoid and tricep, 2/5 bicep and finger movement, right lower extremity 2/5 proximal and 0/5 distally.  Bulk was normal and fasciculations were absent.   Motor Tone - Muscle tone was assessed at the neck and appendages and was normal.  Reflexes - The patient's reflexes were symmetrical in all extremities and he had no pathological reflexes.  Sensory - Light touch, temperature/pinprick were assessed and were symmetrical.    Coordination - The patient had normal movements in left hand with no ataxia or dysmetria.  Tremor was absent.  Gait and Station - deferred.   ASSESSMENT/PLAN [name] is a 68 y.o. male with history of Htn with possible noncompliance presenting with R sided weakness and slurred speech w/ SBP 240s. Received IV tPA 05/29/2018 at 0627.  Stroke: Brainstem versus left brain infarct s/p tPA. Workup underway  Code Stroke CT head age indeterminate L thalamic infarct. ny hmg. Motion degraded.  ASPECTS 10.     CTA head & neck no ELVO. Small basilar, B fetal PCAs w/ severe stenosis mid BA. No flow versus hypoplastic L VA.  MRI  pending   MRA pending  2D Echo pending   LDL pending   HgbA1c pending  HIV pending   SCDs for VTE prophylaxis  NPO  aspirin 81 mg daily prior  to admission, now on No antithrombotic as within 24h of tPA administration  Therapy recommendations:  pending   Disposition:  pending   Hypoplastic versus atherosclerotic posterior circulation  CTA head and neck showed hypoplastic versus occlusion of left VA  Also irregular small basilar artery with severe signal drop mid BA  Bilateral fetal PCAs  MRI pending  May consider cerebral angiogram  Hypertensive Emergency  SBP > 240s on arrival  Home meds:  olmesartan-amlodipine-HCTZ   BP control required prior to starting tPA  On cleviprex drip   Now Stable BP goal per post tPA protocol x 24h following tPA administration  Long-term BP goal normotensive  Hyperlipidemia  Home meds:  lipitor 40  Resume lipitor once able to swallow  LDL pending , goal < 70  Continue statin at discharge  Other Stroke Risk Factors  Advanced age  ETOH use, advised to drink no more than 2 drink(s) a day  Other Active Problems  Hyyperglycemia, A1c pending   Renal insufficiency, Cr 1.36->1.20. repeat in am  Hospital day # 0  This patient is critically ill due to acute stroke, status post TPA, posterior circulation atherosclerosis  versus hypoplastic and at significant risk of neurological worsening, death form recurrent stroke, hemorrhage conversion, seizure, renal failure. This patient's care requires constant monitoring of vital signs, hemodynamics, respiratory and cardiac monitoring, review of multiple databases, neurological assessment, discussion with family, other specialists and medical decision making of high complexity. I spent 35 minutes of neurocritical care time in the care of this patient.  Marvel Plan, MD PhD Stroke Neurology 05/29/2018 3:44 PM     To contact Stroke Continuity provider, please refer to [email or web address]. After hours, contact General Neurology

## 2018-05-29 NOTE — ED Triage Notes (Signed)
Pt arrived via [facility]; pt from [facility]; Pt woke up between 5-510a and attempted to walk to bathroom, wife heard collapse. Pt presented with slurred speech, R sided wkness, and facial droop. Pt only takes ASA 81mg ; 240/140 initially and currently 232/142; 97% on RA; CBG 115, 106 HR

## 2018-05-29 NOTE — Progress Notes (Signed)
PT Cancellation Note  Patient Details Name: Joe Wood MRN: [id number] DOB: 1950/04/30   Cancelled Treatment:    Reason Eval/Treat Not Completed: (P) Medical issues which prohibited therapy Pt is on strict bedrest orders. PT will follow back this afternoon to see if orders have been lifted.  Letecia Arps B. Beverely Risen PT, DPT Acute Rehabilitation Services Pager [phone] Office [phone]    [name] 05/29/2018, 8:19 AM

## 2018-05-29 NOTE — Code Documentation (Signed)
Responded to Code Stroke called at [phone]. Pt arrived at 0600 with R sided weakness. [id number]. NIH-10, CBG-190. CT negative for acute changes. SBP-240s. 20mg  labetolol and cleviprex gtt started and titrated for TPA administration.  TPA started at 0627. CTA done. Pt to be admitted to Neuro ICU.

## 2018-05-29 NOTE — Progress Notes (Signed)
  Echocardiogram 2D Echocardiogram has been performed.  Joe Wood 05/29/2018, 10:17 AM

## 2018-05-29 NOTE — Progress Notes (Signed)
PT Cancellation Note  Patient Details Name: Joe Wood MRN: [id number] DOB: [birth year]   Cancelled Treatment:    Reason Eval/Treat Not Completed: (P) Medical issues which prohibited therapy Pt still has strict bedrest orders and has not yet had MRI. PT will follow back tomorrow for Evaluation.  [name] B. Beverely Risen PT, DPT Acute Rehabilitation Services Pager [phone] Office [phone]    [name] 05/29/2018, 2:04 PM

## 2018-05-29 NOTE — Progress Notes (Signed)
SLP Cancellation Note  Patient Details Name: Joe Wood MRN: [id number] DOB: Sep 24, 1950   Cancelled treatment:       Reason Eval/Treat Not Completed: Patient at procedure or test/unavailable(Pt currently having echo. SLP will follow up. )  Norfleet Capers I. Vear Clock, MS, CCC-SLP Acute Rehabilitation Services Office number [phone] Pager [phone]  Scheryl Marten 05/29/2018, 9:51 AM

## 2018-05-29 NOTE — Evaluation (Signed)
Speech Language Pathology Evaluation Patient Details Name: Joe DavidsonDarryl E Tangonan MRN: [id number] DOB: 09/07/1950 Today's Date: 05/29/2018 Time: [id number] SLP Time Calculation (min) (ACUTE ONLY): 28 min  Problem List:  Patient Active Problem List   Diagnosis Date Noted  . Acute ischemic stroke (HCC) 05/29/2018   Past Medical History:  Past Medical History:  Diagnosis Date  . Hypertension    Past Surgical History: History reviewed. No pertinent surgical history. HPI:  Pt is a 68 y.o. male past medical history of hypertension with questionable compliance to antihypertensives, who woke up on 05/29/18 and suddently collapsed when attempting to reach his phone. He was not able to move his right arm or leg and also had right facial weakness and slurred speech. IV tPA was administered. CT of the head revealed age-indeterminate lacunar infarct in the left thalamus but was negative for acute hemorrhage. MRI has not been completed as yet.    Assessment / Plan / Recommendation Clinical Impression  Pt reported that he was living with his wife prior to admission and that she typically managed his medications. He denied any baseline speech or language deficits but stated that his memory had "not been too good" for the past few months. Per the pt his language skills still appear to be at baseline but his speech is now "slurred", and is less than 10% back to baseline. His language skills are currently within normal limits and his cognition appears to be at baseline based on his performance. However, he presents with moderate to severe dysarthria characterized by reduced articulatory precision, reduce vocal intensity and a reduced ability to adequately coordinate respiration with speech. Further skilled SLP services are clinically indicated at this time to improve dysarthria. Pt, and nursing were educated regarding results and recommendations; both parties verbalized understanding as well as agreement with plan of  care.    SLP Assessment  SLP Recommendation/Assessment: Patient needs continued Speech Lanaguage Pathology Services SLP Visit Diagnosis: Dysarthria and anarthria (R47.1)    Follow Up Recommendations  Other (comment)(Continued SLP services at level of care recommended by PT/OT)    Frequency and Duration min 2x/week  2 weeks      SLP Evaluation Cognition  Overall Cognitive Status: History of cognitive impairments - at baseline Arousal/Alertness: Awake/alert Orientation Level: Oriented to person;Oriented to place;Disoriented to time;Oriented to situation(Oriented to month and day; date: 9th; Year: 1921) Attention: Focused;Sustained Focused Attention: Appears intact Sustained Attention: Appears intact Memory: Impaired Memory Impairment: Retrieval deficit;Decreased recall of new information(Immediate: 3/3; Delayed 0/3 with cues: 1/3) Awareness: Appears intact Problem Solving: Appears intact(5/5)       Comprehension  Auditory Comprehension Overall Auditory Comprehension: Appears within functional limits for tasks assessed Yes/No Questions: Within Functional Limits Basic Biographical Questions: (5/5) Complex Questions: (4/.5) Paragraph Comprehension (via yes/no questions): (3/4) Commands: Within Functional Limits Two Step Basic Commands: (4/4) Multistep Basic Commands: (4/4) Conversation: Complex Reading Comprehension Reading Status: Not tested    Expression Expression Primary Mode of Expression: Verbal Verbal Expression Overall Verbal Expression: Appears within functional limits for tasks assessed Initiation: No impairment Automatic Speech: Counting;Day of week;Month of year(WNL) Level of Generative/Spontaneous Verbalization: Sentence;Conversation Repetition: No impairment(5/5) Naming: No impairment Responsive: (4/5) Confrontation: (10/10) Convergent: (5/5) Divergent: Not tested Verbal Errors: (None) Pragmatics: No impairment   Oral / Motor  Oral Motor/Sensory  Function Overall Oral Motor/Sensory Function: Moderate impairment Facial ROM: Reduced right;Suspected CN VII (facial) dysfunction Facial Symmetry: Abnormal symmetry left;Suspected CN VII (facial) dysfunction Facial Strength: Reduced left;Suspected CN VII (facial) dysfunction Facial Sensation: Within  Functional Limits Lingual ROM: Reduced left;Suspected CN XII (hypoglossal) dysfunction Lingual Symmetry: Within Functional Limits Lingual Strength: Reduced;Suspected CN XII (hypoglossal) dysfunction Velum: Within Functional Limits Motor Speech Overall Motor Speech: Impaired Respiration: Impaired Level of Impairment: Conversation Phonation: Low vocal intensity;Hoarse Resonance: Within functional limits Articulation: Impaired Level of Impairment: Sentence Intelligibility: Intelligibility reduced Word: 75-100% accurate Phrase: 75-100% accurate Sentence: 50-74% accurate Conversation: 50-74% accurate Motor Planning: Witnin functional limits Motor Speech Errors: Aware   Leslie Jester I. Vear Clock, MS, CCC-SLP Acute Rehabilitation Services Office number [phone] Pager [phone]                    Scheryl Marten 05/29/2018, 11:22 AM

## 2018-05-29 NOTE — H&P (Addendum)
STROKE ADMISSION H&P  CC: Rt sided weakness - code stroke  History is obtained from:   HPI: Joe Wood is a 68 y.o. male past medical history of hypertension with questionable compliance to antihypertensives, woke up this morning around 5 AM and went to the bathroom.  Came back to his bed and on attempting to reach his phone suddenly collapsed and when family checked on him he was not able to move the right side-right arm and leg.  He also had right facial weakness and slurred speech.  EMS was called.  They evaluated him and brought him in as an acute code stroke. He was evaluated in the emergency room and exhibited right-sided hemiplegia, right facial droop and dysarthria with no cortical signs suggestive of subcortical strokes. His blood pressures on arrival were systolic 240s. It took a dose of labetalol 20 mg and Cleviprex drip to get his pressures under control which delayed IV TPA. CT head was negative for bleed and IV TPA was started after bringing blood pressure under the parameters. Denied any chest pain shortness of breath nausea vomiting or sickness or illness preceding the symptoms.   LKW: 5:20 AM on 05/29/2018 tpa given?:  Yes Premorbid modified Rankin scale (mRS): 0  ROS:  ROS was performed and is negative except as noted in the HPI.   Past Medical History:  Diagnosis Date  . Hypertension     Family History  Problem Relation Age of Onset  . Cancer Father    Social History:   reports that he has never smoked. He does not have any smokeless tobacco history on file. He reports current alcohol use. He reports that he does not use drugs.  Medications  Current Facility-Administered Medications:  .   stroke: mapping our early stages of recovery book, , Does not apply, Once, Milon Dikes, MD .  0.9 %  sodium chloride infusion, , Intravenous, Continuous, Milon Dikes, MD .  acetaminophen (TYLENOL) tablet 650 mg, 650 mg, Oral, Q4H PRN **OR** acetaminophen (TYLENOL)  solution 650 mg, 650 mg, Per Tube, Q4H PRN **OR** acetaminophen (TYLENOL) suppository 650 mg, 650 mg, Rectal, Q4H PRN, Milon Dikes, MD .  alteplase (ACTIVASE) 1 mg/mL infusion 76.9 mg, 0.9 mg/kg, Intravenous, Once, Milon Dikes, MD .  [address] labetalol (NORMODYNE) injection 20 mg, 20 mg, Intravenous, Once, 20 mg at 05/29/18 0613 **AND** clevidipine (CLEVIPREX) infusion 0.5 mg/mL, 0-21 mg/hr, Intravenous, Continuous, Milon Dikes, MD, Last Rate: 32 mL/hr at 05/29/18 0625, 16 mg/hr at 05/29/18 0625 .  pantoprazole (PROTONIX) injection 40 mg, 40 mg, Intravenous, QHS, Milon Dikes, MD .  senna-docusate (Senokot-S) tablet 1 tablet, 1 tablet, Oral, QHS PRN, Milon Dikes, MD  Current Outpatient Medications:  .  aspirin 81 MG tablet, Take 81 mg by mouth daily., Disp: , Rfl:  .  atorvastatin (LIPITOR) 40 MG tablet, Take 40 mg by mouth daily., Disp: , Rfl:  .  methocarbamol (ROBAXIN) 500 MG tablet, Take 1 tablet (500 mg total) by mouth every 8 (eight) hours as needed., Disp: 30 tablet, Rfl: 0 .  Olmesartan-Amlodipine-HCTZ (TRIBENZOR PO), Take by mouth., Disp: , Rfl:  .  tizanidine (ZANAFLEX) 2 MG capsule, Take 1 capsule (2 mg total) by mouth 3 (three) times daily as needed for muscle spasms., Disp: 30 capsule, Rfl: 0  Exam: Current vital signs: BP (!) 208/109   Pulse 90   Temp 99.8 F (37.7 C) (Oral)   Resp 18   Wt 85.4 kg   SpO2 98%   BMI 32.32 kg/m  Vital signs in last 24 hours: Temp:  [99.8 F (37.7 C)] 99.8 F (37.7 C) (05/07 0617) Pulse Rate:  [90-105] 90 (05/07 0619) Resp:  [18-20] 18 (05/07 0619) BP: (201-244)/(109-138) 208/109 (05/07 0624) SpO2:  [97 %-98 %] 98 % (05/07 0619) Weight:  [85.4 kg] 85.4 kg (05/07 0607) GENERAL: Awake, alert in NAD HEENT: - Normocephalic and atraumatic, dry mm, no LN++, no Thyromegally LUNGS - Clear to auscultation bilaterally with no wheezes CV - S1S2 RRR, no m/r/g, equal pulses bilaterally. ABDOMEN - Soft, nontender, nondistended with  normoactive BS Ext: warm, well perfused, intact peripheral pulses, no edema  NEURO:  Mental Status: AA&Ox3  Language: speech is extremely dysarthric.  Naming, repetition, fluency, and comprehension intact. Cranial Nerves: PERRL EOMI, visual fields full, right lower facial weakness, facial sensation intact, hearing intact, tongue/uvula/soft palate midline, normal sternocleidomastoid and trapezius muscle strength. No evidence of tongue atrophy or fibrillations Motor: Right upper and lower extremity 1/5.  Left upper and lower extremity 5/5 Tone: is normal and bulk is normal Sensation-decreased sensation on the entire right hemibody. Coordination: No dysmetria on the left.  Unable to perform on the right Gait- deferred NIHSS- 10  Labs I have reviewed labs in epic and the results pertinent to this consultation are:  CBC    Component Value Date/Time   WBC 5.9 05/29/2018 0606   RBC 5.28 05/29/2018 0606   HGB 14.9 05/29/2018 0606   HCT 43.8 05/29/2018 0606   PLT 278 05/29/2018 0606   MCV 83.0 05/29/2018 0606   MCH 28.2 05/29/2018 0606   MCHC 34.0 05/29/2018 0606   RDW 12.8 05/29/2018 0606   LYMPHSABS 2.4 05/29/2018 0606   MONOABS 0.6 05/29/2018 0606   EOSABS 0.2 05/29/2018 0606   BASOSABS 0.0 05/29/2018 0606  basic metabolic panel-pending creatinine, 1.2 Normal PT/INR  Imaging I have reviewed the images obtained: CT-scan of the brain-no bleed, ASPECTS 10. Age indeterminate left thalamic lacune. Scattered chronic WM disease CTA head and neck -no emergent LVO.  Multiple critical stenosis in the posterior circulation-bilateral fetal PCAs, severe stenosis in both right and left PCAs as well as intracranial atherosclerosis in the MCAs.  Assessment: 68 year old man with uncontrolled hypertension with possible noncompliance to medication presenting with sudden onset of right-sided hemiplegia, right facial weakness and dysarthria. Noncontrast CT with chronic white matter disease, no bleed,  age-indeterminate left thalamic lacunar infarct Clinical exam consistent with a small vessel acute ischemic stroke. No contraindications for IV TPA, risks and benefits discussed with the patient and he agreed and IV TPA given after getting blood pressure down to the parameters. Delay in IV TPA due to severe hypotension and requirement of multiple medications including drips to control blood pressure. Will be admitted to the neurological ICU for post TPA care and stroke work-up.  Per hospital protocol, is being tested for COVID-19 although all screening questions were negative.  Impression: Acute ischemic stroke-likely small vessel etiology given multiple intracranial stenoses as well as intracerebral atherosclerosis  Acuity: Acute Current Suspected Etiology: Small vessel disease/iCAD Continue Evaluation:  -Admit to: Neurological ICU -Hold Aspirin until 24 hour post tPA neuroimaging is stable and without evidence of bleeding -Blood pressure control, goal of SYS <180/100 -MRI/ECHO/A1C/Lipid panel. -Hyperglycemia management per SSI to maintain glucose 140-180mg /dL. -PT/OT/ST therapies and recommendations when able -Urinary toxicology screen  CNS -Close neuro monitoring  Dysarthria Dysphagia following cerebral infarction  -NPO until cleared by speech -ST -Advance diet as tolerated  Hemiplegia and hemiparesis following cerebral infarction affecting right dominant side -PT/OT -  PM&R consult  Hypertensive encephalopathy -Blood pressure management as above  RESP No acute issues at this time Obtain chest x-ray due to concern for possible aspiration pneumonia because of dysarthria/dysphasia  CV Hypertensive Emergency Hypertensive Urgency -Aggressive BP control per TPA protocol as above -Given 20 mg of labetalol IV.  Started on Cleviprex drip.  Titrate drip to keep under post TPA goal. -Transthoracic echo -EKG  Hyperlipidemia, unspecified  - Statin for goal LDL <  70  HEME -Monitor -transfuse for hgb < 7  ENDO -goal HgbA1c < 7  GI/GU Gentle hydration  Fluid/Electrolyte Disorders -Repeat labs and replete electrolytes as necessary  ID Possible Aspiration PNA -CXR -NPO -Monitor   Prophylaxis DVT: SCD GI: Pantoprazole Bowel: Doc senna  Diet: NPO until cleared by speech/bedside swallow  Code Status: Full Code   Delays in IV TPA due to severe hypertension requiring multiple medications to control   -- Milon DikesAshish Shaneque Merkle, MD [facility] Pager: [phone] If 7pm to 7am, please call on call as listed on [facility]ION.   CRITICAL CARE ATTESTATION Performed by: Milon DikesAshish Caedyn Tassinari, MD Total critical care time: 45 minutes Critical care time was exclusive of separately billable procedures and treating other patients and/or supervising APPs/Residents/Students Critical care was necessary to treat or prevent imminent or life-threatening deterioration due to acute ischemic stroke. This patient is critically ill and at significant risk for neurological worsening and/or death and care requires constant monitoring. Critical care was time spent personally by me on the following activities: development of treatment plan with patient and/or surrogate as well as nursing, discussions with consultants, evaluation of patient's response to treatment, examination of patient, obtaining history from patient or surrogate, ordering and performing treatments and interventions, ordering and review of laboratory studies, ordering and review of radiographic studies, pulse oximetry, re-evaluation of patient's condition, participation in multidisciplinary rounds and medical decision making of high complexity in the care of this patient.

## 2018-05-29 NOTE — Progress Notes (Signed)
Pharmacist Code Stroke Response  Notified to mix tPA at 0609 by Dr. Wilford Corner Delivered tPA to RN at [phone]  tPA dose = 7.7mg  bolus over 1 minute followed by 69.2mg  for a total dose of 76.9mg  over 1 hour  Issues/delays encountered (if applicable): Delay in starting tPA due to persistently elevated SBP >200  Abran Duke 05/29/18 6:21 AM

## 2018-05-30 ENCOUNTER — Inpatient Hospital Stay (HOSPITAL_COMMUNITY): Payer: Medicare Other

## 2018-05-30 DIAGNOSIS — E785 Hyperlipidemia, unspecified: Secondary | ICD-10-CM

## 2018-05-30 DIAGNOSIS — I63313 Cerebral infarction due to thrombosis of bilateral middle cerebral arteries: Secondary | ICD-10-CM

## 2018-05-30 DIAGNOSIS — I161 Hypertensive emergency: Secondary | ICD-10-CM

## 2018-05-30 DIAGNOSIS — R739 Hyperglycemia, unspecified: Secondary | ICD-10-CM

## 2018-05-30 DIAGNOSIS — E1159 Type 2 diabetes mellitus with other circulatory complications: Secondary | ICD-10-CM

## 2018-05-30 LAB — BASIC METABOLIC PANEL
Anion gap: 15 (ref 5–15)
BUN: 10 mg/dL (ref 8–23)
CO2: 25 mmol/L (ref 22–32)
Calcium: 9.9 mg/dL (ref 8.9–10.3)
Chloride: 97 mmol/L — ABNORMAL LOW (ref 98–111)
Creatinine, Ser: 0.9 mg/dL (ref 0.61–1.24)
GFR calc Af Amer: >60 mL/min (ref >60)
GFR calc non Af Amer: >60 mL/min (ref >60)
Glucose, Bld: 209 mg/dL — ABNORMAL HIGH (ref 70–99)
Potassium: 3.6 mmol/L (ref 3.5–5.1)
Sodium: 137 mmol/L (ref 135–145)

## 2018-05-30 LAB — CBC
HCT: 47.6 % (ref 39.0–52.0)
Hemoglobin: 16.3 g/dL (ref 13.0–17.0)
MCH: 27.9 pg (ref 26.0–34.0)
MCHC: 34.2 g/dL (ref 30.0–36.0)
MCV: 81.4 fL (ref 80.0–100.0)
Platelets: 283 10*3/uL (ref 150–400)
RBC: 5.85 MIL/uL — ABNORMAL HIGH (ref 4.22–5.81)
RDW: 12.9 % (ref 11.5–15.5)
WBC: 7.8 10*3/uL (ref 4.0–10.5)
nRBC: 0 % (ref 0.0–0.2)

## 2018-05-30 LAB — GLUCOSE, CAPILLARY
Glucose-Capillary: 223 mg/dL — ABNORMAL HIGH (ref 70–99)
Glucose-Capillary: 200 mg/dL — ABNORMAL HIGH (ref 70–99)
Glucose-Capillary: 186 mg/dL — ABNORMAL HIGH (ref 70–99)
Glucose-Capillary: 196 mg/dL — ABNORMAL HIGH (ref 70–99)

## 2018-05-30 LAB — LIPID PANEL
Cholesterol: 233 mg/dL — ABNORMAL HIGH (ref 0–200)
HDL: 62 mg/dL (ref >40)
LDL Cholesterol: 119 mg/dL — ABNORMAL HIGH (ref 0–99)
Total CHOL/HDL Ratio: 3.8 RATIO
Triglycerides: 259 mg/dL — ABNORMAL HIGH (ref <150)
VLDL: 52 mg/dL — ABNORMAL HIGH (ref 0–40)

## 2018-05-30 LAB — TRIGLYCERIDES: Triglycerides: 251 mg/dL — ABNORMAL HIGH (ref <150)

## 2018-05-30 LAB — VITAMIN B12: Vitamin B-12: 236 pg/mL (ref 180–914)

## 2018-05-30 LAB — TSH: TSH: 0.653 u[IU]/mL (ref 0.350–4.500)

## 2018-05-30 LAB — HEMOGLOBIN A1C
Hgb A1c MFr Bld: 6.5 % — ABNORMAL HIGH (ref 4.8–5.6)
Mean Plasma Glucose: 139.85 mg/dL

## 2018-05-30 MED ORDER — OLMESARTAN-AMLODIPINE-HCTZ 40-10-12.5 MG PO TABS: 1.0000 | ORAL_TABLET | Freq: Every day | ORAL | Status: DC

## 2018-05-30 MED ORDER — METOPROLOL TARTRATE 50 MG PO TABS
50.0000 mg | ORAL_TABLET | Freq: Two times a day (BID) | ORAL | Status: DC
  Administered 2018-05-30: 50 mg via ORAL
  Filled 2018-05-30: qty 1

## 2018-05-30 MED ORDER — IRBESARTAN 150 MG PO TABS: 300.0000 mg | ORAL_TABLET | Freq: Every day | ORAL | Status: DC

## 2018-05-30 MED ORDER — ADULT MULTIVITAMIN W/MINERALS CH
1.0000 | ORAL_TABLET | Freq: Every day | ORAL | Status: DC
  Administered 2018-05-30 – 2018-06-05 (×7): 1 via ORAL
  Filled 2018-05-30 (×7): qty 1

## 2018-05-30 MED ORDER — ENOXAPARIN SODIUM 40 MG/0.4ML ~~LOC~~ SOLN
40.0000 mg | SUBCUTANEOUS | Status: AC
  Administered 2018-05-30 – 2018-06-01 (×3): 40 mg via SUBCUTANEOUS
  Filled 2018-05-30 (×3): qty 0.4

## 2018-05-30 MED ORDER — THIAMINE HCL 100 MG/ML IJ SOLN
100.0000 mg | Freq: Every day | INTRAMUSCULAR | Status: DC
  Filled 2018-05-30 (×2): qty 2

## 2018-05-30 MED ORDER — FOLIC ACID 1 MG PO TABS
1.0000 mg | ORAL_TABLET | Freq: Every day | ORAL | Status: DC
  Administered 2018-05-30 – 2018-06-05 (×7): 1 mg via ORAL
  Filled 2018-05-30 (×7): qty 1

## 2018-05-30 MED ORDER — VITAMIN B-1 100 MG PO TABS
100.0000 mg | ORAL_TABLET | Freq: Every day | ORAL | Status: DC
  Administered 2018-05-30 – 2018-06-05 (×7): 100 mg via ORAL
  Filled 2018-05-30 (×7): qty 1

## 2018-05-30 MED ORDER — HYDROCHLOROTHIAZIDE 12.5 MG PO CAPS: 12.5000 mg | ORAL_CAPSULE | Freq: Every day | ORAL | Status: DC

## 2018-05-30 MED ORDER — HYDROCHLOROTHIAZIDE 12.5 MG PO CAPS
12.5000 mg | ORAL_CAPSULE | Freq: Every day | ORAL | Status: DC
  Filled 2018-05-30: qty 1

## 2018-05-30 MED ORDER — CLEVIDIPINE BUTYRATE 0.5 MG/ML IV EMUL
0.0000 mg/h | INTRAVENOUS | Status: DC
  Administered 2018-05-30 – 2018-05-31 (×3): 4 mg/h via INTRAVENOUS
  Administered 2018-05-31 – 2018-06-01 (×2): 2 mg/h via INTRAVENOUS
  Administered 2018-06-01: 3 mg/h via INTRAVENOUS
  Administered 2018-06-01: 2 mg/h via INTRAVENOUS
  Filled 2018-05-30 (×2): qty 50
  Filled 2018-05-30: qty 100
  Filled 2018-05-30: qty 50

## 2018-05-30 MED ORDER — PANTOPRAZOLE SODIUM 40 MG PO TBEC
40.0000 mg | DELAYED_RELEASE_TABLET | Freq: Every day | ORAL | Status: DC
  Administered 2018-05-30 – 2018-06-05 (×7): 40 mg via ORAL
  Filled 2018-05-30 (×7): qty 1

## 2018-05-30 MED ORDER — IRBESARTAN 150 MG PO TABS
300.0000 mg | ORAL_TABLET | Freq: Every day | ORAL | Status: DC
  Filled 2018-05-30: qty 2

## 2018-05-30 MED ORDER — LORAZEPAM 2 MG/ML IJ SOLN
1.0000 mg | Freq: Four times a day (QID) | INTRAMUSCULAR | Status: AC | PRN
  Filled 2018-05-30 (×2): qty 1

## 2018-05-30 MED ORDER — LORAZEPAM 1 MG PO TABS: 1.0000 mg | ORAL_TABLET | Freq: Four times a day (QID) | ORAL | Status: AC | PRN

## 2018-05-30 MED ORDER — ASPIRIN EC 81 MG PO TBEC
81.0000 mg | DELAYED_RELEASE_TABLET | Freq: Every day | ORAL | Status: DC
  Administered 2018-05-30 – 2018-06-05 (×7): 81 mg via ORAL
  Filled 2018-05-30 (×7): qty 1

## 2018-05-30 MED ORDER — CLOPIDOGREL BISULFATE 75 MG PO TABS
75.0000 mg | ORAL_TABLET | Freq: Every day | ORAL | Status: DC
  Administered 2018-05-30 – 2018-06-05 (×7): 75 mg via ORAL
  Filled 2018-05-30 (×7): qty 1

## 2018-05-30 MED ORDER — AMLODIPINE BESYLATE 10 MG PO TABS
10.0000 mg | ORAL_TABLET | Freq: Every day | ORAL | Status: DC
  Administered 2018-05-31 – 2018-06-05 (×6): 10 mg via ORAL
  Filled 2018-05-30 (×6): qty 1

## 2018-05-30 MED ORDER — LABETALOL HCL 5 MG/ML IV SOLN
10.0000 mg | INTRAVENOUS | Status: DC | PRN
  Administered 2018-05-30 – 2018-06-02 (×9): 20 mg via INTRAVENOUS
  Administered 2018-06-04: 11:00:00 10 mg via INTRAVENOUS
  Filled 2018-05-30 (×11): qty 4

## 2018-05-30 MED ORDER — LOSARTAN POTASSIUM 50 MG PO TABS
50.0000 mg | ORAL_TABLET | Freq: Every day | ORAL | Status: DC
  Administered 2018-05-30: 50 mg via ORAL
  Filled 2018-05-30: qty 1

## 2018-05-30 MED ORDER — ATORVASTATIN CALCIUM 40 MG PO TABS
40.0000 mg | ORAL_TABLET | Freq: Every day | ORAL | Status: DC
  Administered 2018-05-30 – 2018-06-04 (×5): 40 mg via ORAL
  Filled 2018-05-30 (×4): qty 1

## 2018-05-30 MED ORDER — AMLODIPINE BESYLATE 10 MG PO TABS: 10.0000 mg | ORAL_TABLET | Freq: Every day | ORAL | Status: DC

## 2018-05-30 MED ORDER — METOPROLOL TARTRATE 25 MG PO TABS
25.0000 mg | ORAL_TABLET | Freq: Two times a day (BID) | ORAL | Status: DC
  Administered 2018-05-30 – 2018-06-05 (×10): 25 mg via ORAL
  Filled 2018-05-30 (×11): qty 1

## 2018-05-30 NOTE — Progress Notes (Signed)
  Speech Language Pathology Treatment: Cognitive-Linquistic(Dysarthria)  Patient Details Name: Joe Wood MRN: [id number] DOB: 1950/07/06 Today's Date: 05/30/2018 Time: 9191-6606 SLP Time Calculation (min) (ACUTE ONLY): 17 min  Assessment / Plan / Recommendation Clinical Impression  Pt was alert and cooperative throughout session with no complaint of pain. He was educated regarding his goals, compensatory strategies for speech intelligibility, and the plan for the session and verbalized understanding. He completed labial and lingual ROM and strengthening exercises with mod cues. He was able to use compensatory strategies at the word level with 50% accuracy increasing to 100% accuracy with mod cues for overarticulation. At the phrase level he demonstrated 60% accuracy increasing to 100% accuracy with mod cues for rate and overarticulation. SLP will continue to follow.    HPI HPI: Pt is a 68 y.o. male past medical history of hypertension with questionable compliance to antihypertensives, who woke up on 05/29/18 and suddently collapsed when attempting to reach his phone. He was not able to move his right arm or leg and also had right facial weakness and slurred speech. IV tPA was administered. CT of the head revealed age-indeterminate lacunar infarct in the left thalamus but was negative for acute hemorrhage. 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      SLP Plan  Continue with current plan of care       Recommendations                   Follow up Recommendations: Other (comment)(Continued SLP services at level of care recommended by PT/OT) SLP Visit Diagnosis: Dysarthria and anarthria (R47.1) Plan: Continue with current plan of care       Joe Wood I. Vear Clock, MS, CCC-SLP Acute Rehabilitation Services Office number [phone] Pager [phone]                 Joe Wood 05/30/2018,  12:28 PM

## 2018-05-30 NOTE — Progress Notes (Signed)
Rehab Admissions Coordinator Note:  Patient was screened by Clois Dupes for appropriateness for an Inpatient Acute Rehab Consult per PT and OT recs.  At this time, we are recommending Inpatient Rehab consult.  Clois Dupes 05/30/2018, 4:06 PM  I can be reached at [phone].

## 2018-05-30 NOTE — Progress Notes (Addendum)
STROKE TEAM PROGRESS NOTE   INTERVAL HISTORY Patient lying in bed, awake alert, orientated, right UE strength improved.  Still has right lower extremity weakness.  MRI showed left BG/CR and punctate right BG infarcts.  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  Discussed with Dr. Corliss Skains, will do cerebral angiogram on Monday.  Vitals:   05/30/18 1030 05/30/18 1100 05/30/18 1130 05/30/18 1200  BP: (!) 150/80 (!) 144/86 126/79 139/86  Pulse: 91 79 69 74  Resp: Temp:    98.1 F (36.7 C)  TempSrc:    Axillary  SpO2: 97% 95% (!) 88% 91%  Weight:      Height:        CBC:  Recent Labs  Lab 05/29/18 0606 05/30/18 0250  WBC 5.9 7.8  NEUTROABS 2.7  --   HGB 14.9 16.3  HCT 43.8 47.6  MCV 83.0 81.4  PLT 278 283    Basic Metabolic Panel:  Recent Labs  Lab 05/29/18 0606 05/29/18 0617 05/30/18 0250  NA 135  --  137  K 3.5  --  3.6  CL 96*  --  97*  CO2 25  --  25  GLUCOSE 190*  --  209*  BUN 12  --  10  CREATININE 1.36* 1.20 0.90  CALCIUM 10.1  --  9.9   Lipid Panel:     Component Value Date/Time   CHOL 233 (H) 05/30/2018 0250   TRIG 259 (H) 05/30/2018 0250   TRIG 251 (H) 05/30/2018 0250   HDL 62 05/30/2018 0250   CHOLHDL 3.8 05/30/2018 0250   VLDL 52 (H) 05/30/2018 0250   LDLCALC 119 (H) 05/30/2018 0250   HgbA1c:  Lab Results  Component Value Date   HGBA1C 6.5 (H) 05/30/2018   Urine Drug Screen:     Component Value Date/Time   LABOPIA NONE DETECTED 05/29/2018 1650   COCAINSCRNUR NONE DETECTED 05/29/2018 1650   LABBENZ NONE DETECTED 05/29/2018 1650   AMPHETMU NONE DETECTED 05/29/2018 1650   THCU NONE DETECTED 05/29/2018 1650   LABBARB NONE DETECTED 05/29/2018 1650    Alcohol Level     Component Value Date/Time   ETH <10 05/29/2018 0606    IMAGING Ct Angio Head W Or Wo Contrast  Result Date: 05/29/2018 CLINICAL DATA:  Right-sided weakness EXAM: CT ANGIOGRAPHY HEAD AND NECK TECHNIQUE: Multidetector CT imaging  of the head and neck was performed using the standard protocol during bolus administration of intravenous contrast. Multiplanar CT image reconstructions and MIPs were obtained to evaluate the vascular anatomy. Carotid stenosis measurements (when applicable) are obtained utilizing NASCET criteria, using the distal internal carotid diameter as the denominator. CONTRAST:  75mL OMNIPAQUE IOHEXOL 350 MG/ML SOLN COMPARISON:  Head CT earlier today FINDINGS: CTA NECK FINDINGS Aortic arch: Atheromatous plaque.  No acute finding Right carotid system: Moderate calcified plaque at the bifurcation without flow limiting stenosis or ulceration. Left carotid system: Calcified plaque at the bifurcation without flow limiting stenosis or ulceration. Vertebral arteries: No proximal subclavian stenosis. No flow is seen throughout the left vertebral artery. The right vertebral artery is smooth and widely patent to the dura. Skeleton: No acute or aggressive finding Other neck: No acute finding Upper chest: Negative Review of the MIP images confirms the above findings CTA HEAD FINDINGS Anterior circulation: Due to motion requiring repeat imaging there is suboptimal bolus density. Atherosclerotic plaque on the carotid siphons. No proximal flow limiting stenosis or major branch occlusion is seen. Probable medium  vessel irregularity from atherosclerosis, but limited by bolus quality. Posterior circulation: No flow is seen within the left vertebral artery until the intradural segment where faint flow is seen. Small basilar in the setting of bilateral fetal type PCA. There is a mid basilar severe stenosis with apparent flow gap. Advanced narrowing of the right PCA at the P2 segment. Atheromatous irregularity of the left P2/3 segment. Venous sinuses: Patent when accounting for arachnoid granulations. Anatomic variants: As above Delayed phase: Not obtained Review of the MIP images confirms the above findings IMPRESSION: 1. No emergent large  vessel occlusion. 2. Small basilar in the setting of bilateral fetal type PCA with severe stenosis of the mid basilar giving an apparent flow gap. No flow is seen within the left vertebral artery until the distal V4 segment. 3. Cervical and intracranial carotid atherosclerosis without flow limiting stenosis. Electronically Signed   By: Marnee Spring M.D.   On: 05/29/2018 06:51   Ct Angio Neck W Or Wo Contrast  Result Date: 05/29/2018 CLINICAL DATA:  Right-sided weakness EXAM: CT ANGIOGRAPHY HEAD AND NECK TECHNIQUE: Multidetector CT imaging of the head and neck was performed using the standard protocol during bolus administration of intravenous contrast. Multiplanar CT image reconstructions and MIPs were obtained to evaluate the vascular anatomy. Carotid stenosis measurements (when applicable) are obtained utilizing NASCET criteria, using the distal internal carotid diameter as the denominator. CONTRAST:  19mL OMNIPAQUE IOHEXOL 350 MG/ML SOLN COMPARISON:  Head CT earlier today FINDINGS: CTA NECK FINDINGS Aortic arch: Atheromatous plaque.  No acute finding Right carotid system: Moderate calcified plaque at the bifurcation without flow limiting stenosis or ulceration. Left carotid system: Calcified plaque at the bifurcation without flow limiting stenosis or ulceration. Vertebral arteries: No proximal subclavian stenosis. No flow is seen throughout the left vertebral artery. The right vertebral artery is smooth and widely patent to the dura. Skeleton: No acute or aggressive finding Other neck: No acute finding Upper chest: Negative Review of the MIP images confirms the above findings CTA HEAD FINDINGS Anterior circulation: Due to motion requiring repeat imaging there is suboptimal bolus density. Atherosclerotic plaque on the carotid siphons. No proximal flow limiting stenosis or major branch occlusion is seen. Probable medium vessel irregularity from atherosclerosis, but limited by bolus quality. Posterior  circulation: No flow is seen within the left vertebral artery until the intradural segment where faint flow is seen. Small basilar in the setting of bilateral fetal type PCA. There is a mid basilar severe stenosis with apparent flow gap. Advanced narrowing of the right PCA at the P2 segment. Atheromatous irregularity of the left P2/3 segment. Venous sinuses: Patent when accounting for arachnoid granulations. Anatomic variants: As above Delayed phase: Not obtained Review of the MIP images confirms the above findings IMPRESSION: 1. No emergent large vessel occlusion. 2. Small basilar in the setting of bilateral fetal type PCA with severe stenosis of the mid basilar giving an apparent flow gap. No flow is seen within the left vertebral artery until the distal V4 segment. 3. Cervical and intracranial carotid atherosclerosis without flow limiting stenosis. Electronically Signed   By: Marnee Spring M.D.   On: 05/29/2018 06:51   [name] Head Wo Contrast  Result Date: 05/30/2018 CLINICAL DATA:  Stroke follow-up EXAM: MRI HEAD WITHOUT CONTRAST MRA HEAD WITHOUT CONTRAST TECHNIQUE: Multiplanar, multiecho pulse sequences of the brain and surrounding structures were obtained without intravenous contrast. Angiographic images of the head were obtained using MRA technique without contrast. COMPARISON:  CTA head neck from yesterday FINDINGS:  MRI HEAD FINDINGS Brain: Wedge of restricted diffusion in the left corona radiata. Small acute infarct at the right putamen. Background of chronic small vessel ischemia with gliosis in the cerebral white matter and lacune at the frontal horn of the left lateral ventricle. Small remote right pontine and right cerebellar infarcts. Remote left thalamic infarct. No acute hemorrhage, hydrocephalus, or masslike finding. Asymmetric prominent CSF in the left parietal region but no clear vessel displacement to imply hygroma. Vascular: Arterial findings below. Skull and upper cervical spine: Negative  for marrow lesion Sinuses/Orbits: Negative MRA HEAD FINDINGS Dominant right vertebral artery. Minimal flow present in the left vertebral artery. Small basilar in the setting of fetal type bilateral PCA. There is a superimposed severe basilar stenosis with apparent flow gap. Bilateral PCA atherosclerosis with moderate P2/3 narrowings. Atherosclerotic irregularity of anterior circulation medium size vessels. No branch occlusion or aneurysm. IMPRESSION: 1. Acute perforator infarct in the left corona radiata. 2. 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 3. 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 4. Intracranial MRA is stable from CTA yesterday. 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 Electronically Signed   By: Marnee SpringJonathon  Watts M.D.   On: 05/30/2018 06:06   Mr Brain Wo Contrast  Result Date: 05/30/2018 CLINICAL DATA:  Stroke follow-up EXAM: MRI HEAD WITHOUT CONTRAST MRA HEAD WITHOUT CONTRAST TECHNIQUE: Multiplanar, multiecho pulse sequences of the brain and surrounding structures were obtained without intravenous contrast. Angiographic images of the head were obtained using MRA technique without contrast. COMPARISON:  CTA head neck from yesterday FINDINGS: MRI HEAD FINDINGS Brain: Wedge of restricted diffusion in the left corona radiata. Small acute infarct at the right putamen. Background of chronic small vessel ischemia with gliosis in the cerebral white matter and lacune at the frontal horn of the left lateral ventricle. Small remote right pontine and right cerebellar infarcts. Remote left thalamic infarct. No acute hemorrhage, hydrocephalus, or masslike finding. Asymmetric prominent CSF in the left parietal region but no clear vessel displacement to imply hygroma. Vascular: Arterial findings below. Skull and upper cervical spine: Negative for marrow lesion Sinuses/Orbits: Negative MRA HEAD FINDINGS Dominant right vertebral artery. Minimal flow present in  the left vertebral artery. Small basilar in the setting of fetal type bilateral PCA. There is a superimposed severe basilar stenosis with apparent flow gap. Bilateral PCA atherosclerosis with moderate P2/3 narrowings. Atherosclerotic irregularity of anterior circulation medium size vessels. No branch occlusion or aneurysm. IMPRESSION: 1. Acute perforator infarct in the left corona radiata. 2. 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 3. 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 4. Intracranial MRA is stable from CTA yesterday. 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 Electronically Signed   By: Marnee SpringJonathon  Watts M.D.   On: 05/30/2018 06:06   Ct Head Code Stroke Wo Contrast  Result Date: 05/29/2018 CLINICAL DATA:  Code stroke. Right-sided deficits. Elevated blood pressure EXAM: CT HEAD WITHOUT CONTRAST TECHNIQUE: Contiguous axial images were obtained from the base of the skull through the vertex without intravenous contrast. COMPARISON:  None. FINDINGS: Brain: Lacunar infarct in the left thalamus, age-indeterminate based on density. Small remote right cerebellar infarct. Chronic small vessel ischemic type change in the cerebral white matter. Symmetric atrophy. No hydrocephalus or masslike finding. Vascular: No hyperdense vessel. Skull: Negative Sinuses/Orbits: Negative Other: Motion degraded. These results were communicated to Dr. Wilford CornerArora at [facility]6:13 amon 5/7/2020by text page via the [facility] messaging system. ASPECTS [facility]([place]  Stroke Program Early CT Score) - Ganglionic level infarction (caudate, lentiform nuclei, internal capsule, insula, M1-M3 cortex): 7 - Supraganglionic infarction (M4-M6 cortex): 3 Total score (0-10 with 10 being normal): 10 IMPRESSION: 1. Age-indeterminate lacunar infarct in the left thalamus. 2. Negative for acute hemorrhage. 3. Motion degraded. Electronically Signed   By: Marnee Spring M.D.   On: 05/29/2018 06:15    PHYSICAL EXAM  Temp:   [98 F (36.7 C)-98.8 F (37.1 C)] 98.1 F (36.7 C) (05/08 1200) Pulse Rate:  [69-114] 74 (05/08 1200) Resp:  [11-20] 14 (05/08 1200) BP: (126-183)/(79-111) 139/86 (05/08 1200) SpO2:  [88 %-98 %] 91 % (05/08 1200)  General - Well nourished, well developed, in no apparent distress.  Ophthalmologic - fundi not visualized due to noncooperation.  Cardiovascular - Regular rate and rhythm.  Mental Status -  Level of arousal and orientation to time, place, and person were intact. Language including expression, naming, repetition, comprehension was assessed and found intact.  Mild to moderate dysarthria Fund of Knowledge was assessed and was intact.  Cranial Nerves II - XII - II - Visual field intact OU. III, IV, VI - Extraocular movements intact. V - Facial sensation intact bilaterally. VII - right facial droop. VIII - Hearing & vestibular intact bilaterally. X - Palate elevates symmetrically, mild to moderate dysarthria. XI - Chin turning & shoulder shrug intact bilaterally. XII - Tongue protrusion intact.  Motor Strength - The patient's strength was normal in left upper and left lower extremities, however right upper extremity 0/5 deltoid and tricep, 3/5 bicep and finger movement, right lower extremity 2/5 proximal and 0/5 distally.  Bulk was normal and fasciculations were absent.   Motor Tone - Muscle tone was assessed at the neck and appendages and was normal.  Reflexes - The patient's reflexes were symmetrical in all extremities and he had no pathological reflexes.  Sensory - Light touch, temperature/pinprick were assessed and were symmetrical.    Coordination - The patient had normal movements in left hand with no ataxia or dysmetria.  Tremor was absent.  Gait and Station - deferred.   ASSESSMENT/PLAN [name] is a 68 y.o. male with history of Htn with possible noncompliance presenting with R sided weakness and slurred speech w/ SBP 240s. Received IV tPA 05/29/2018  at 0627.  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  Code Stroke CT head age indeterminate L thalamic infarct. ny hmg. Motion degraded.  ASPECTS 10.     CTA head & neck no ELVO. Small basilar, B fetal PCAs w/ severe stenosis mid BA. No flow versus hypoplastic L VA.  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 fib given separate infarcts  LDL 119   HgbA1c 6.5  UDS negative  Lovenox for VTE prophylaxis  aspirin 81 mg daily prior to admission, now on aspirin and Plavix DAPT for 3 weeks and then Plavix alone.  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 Corliss Skains  N.p.o. Monday morning  Hypertensive Emergency  SBP > 240s on arrival  Home meds:  olmesartan-amlodipine-HCTZ and metoprolol  BP control required prior to starting tPA  Off cleviprex drip   Now Stable  On metoprolol 25 twice daily and losartan 50  BP goal130-150 given hypoplastic versus chronic atherosclerotic posterior circulation  Hyperlipidemia  Home meds:  lipitor 40, not compliant  Resume lipitor 40  LDL 119, goal < 70  Continue statin at discharge  Diabetes, new diagnosis  A1c 6.5  DM coordinator on board  Hyperglycemia  SSI  CBG monitoring  Close PCP follow-up and diabetes control  Alcohol abuse  Patient reported 3-4 beers per day  advised to drink no  more than 2 drink(s) a day  Will start CIWA protocol  FA/B1/MVI  Other Stroke Risk Factors  Advanced age  Other Active Problems  AKI, Cr 1.36->1.20->0.90  Hospital day # 1  This patient is critically ill due to acute stroke, status post TPA, posterior circulation atherosclerosis versus hypoplastic, hypertensive emergency, new diagnosed diabetes and at significant risk of neurological worsening, death form recurrent stroke, hemorrhage conversion, seizure, renal failure. This patient's care requires constant monitoring of vital signs, hemodynamics, respiratory and cardiac monitoring, review of multiple databases, neurological assessment, discussion with family, other specialists and medical decision making of high complexity. I spent 35 minutes of neurocritical care time in the care of this patient.  Marvel Plan, MD PhD Stroke Neurology 05/30/2018 2:25 PM     To contact Stroke Continuity provider, please refer to [email or web address]. After hours, contact General Neurology

## 2018-05-30 NOTE — Progress Notes (Signed)
Patient ID: Joe Wood, male   DOB: [birth year], 68 y.o.   MRN: [id number]   Scheduled for cerebral arteriogram 5/11 in IR  We will consent pt in IR See orders

## 2018-05-30 NOTE — Progress Notes (Signed)
Inpatient Diabetes Program Recommendations  AACE/ADA: New Consensus Statement on Inpatient Glycemic Control (2015)  Target Ranges:  Prepandial:   less than 140 mg/dL      Peak postprandial:   less than 180 mg/dL (1-2 hours)      Critically ill patients:  140 - 180 mg/dL   Lab Results  Component Value Date   GLUCAP 223 (H) 05/30/2018   HGBA1C 6.5 (H) 05/30/2018    Review of Glycemic Control  Diabetes history: none noted Outpatient Diabetes medications: none Current orders for Inpatient glycemic control: Novolog SENSITIVE correction scale TID  Inpatient Diabetes Program Recommendations:    Noted that patient's HgbA1C is 6.5% which is a diagnosis of diabetes according to the American Diabetes Association. Will need close follow up with a PCP to monitor blood glucose control after discharge.  Will continue to monitor blood sugars while in the hospital.  Smith Mince RN BSN CDE Diabetes Coordinator Pager: [phone]  8am-5pm

## 2018-05-30 NOTE — Evaluation (Signed)
Physical Therapy Evaluation Patient Details Name: Joe DavidsonDarryl E Leclere MRN: [id number] DOB: 08/27/1950 Today's Date: 05/30/2018   History of Present Illness  Pt is a 68 y.o. M with significant PMH of hypertension with questionable complaince to antihypertensives. Presents with right arm leg weakness, facial weakness and slurred speech. IV tPA was administered. 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  Clinical Impression  Pt admitted with above diagnosis. Pt currently with functional limitations due to the deficits listed below (see PT Problem List). Prior to admission, pt was independent and enjoyed doing yard work. On PT evaluation, pt demonstrating decreased functional mobility secondary to right hemiplegia, balance impairments, and cognitive executive functioning deficits. Pt requiring two person mod-maximal assistance for transferring from bed to chair. Displays right knee buckling but able to achieve glute activation with tactile cueing. Highly recommend CIR to maximize functional independence. Suspect excellent progress based on motivation, PLOF, and family support.      Follow Up Recommendations CIR    Equipment Recommendations  Other (comment)(tbd)    Recommendations for Other Services Rehab consult     Precautions / Restrictions Precautions Precautions: Fall Restrictions Weight Bearing Restrictions: No      Mobility  Bed Mobility Overal bed mobility: Needs Assistance Bed Mobility: Supine to Sit     Supine to sit: Mod assist     General bed mobility comments: Pt requires assist to initiate movement of Rt LE off bed and to lift shoulders.     Transfers Overall transfer level: Needs assistance Equipment used: 2 person hand held assist Transfers: Sit to/from [employer] CorporationStand;Stand Pivot Transfers Sit to Stand: Mod assist;Max assist;+2 physical assistance;+2 safety/equipment Stand pivot transfers: Mod assist;+2 physical  assistance;+2 safety/equipment       General transfer comment: Pt required max A +2 for initial standing due to Rt knee buckling.  He tends to overuse Lt side in attempts to activate the Rt.   On second attempt at standing, he demonstrated improved abillity to with mod  A +2 with less knee buckling and facilitation of Lt gluts.  he was able to pivot to the Lt with faciliation to maintain trunk and hip extension as well as assist to advance and pivot with Rt foot   Ambulation/Gait                Stairs            Wheelchair Mobility    Modified Rankin (Stroke Patients Only)       Balance Overall balance assessment: Needs assistance Sitting-balance support: Feet supported Sitting balance-Leahy Scale: Poor Sitting balance - Comments: Pt requires min A, progressing to close min guard assist  for static sitting  Postural control: Right lateral lean Standing balance support: Single extremity supported Standing balance-Leahy Scale: Poor Standing balance comment: requires max A, progressing to mod A +2 for static standing                              Pertinent Vitals/Pain Pain Assessment: No/denies pain    Home Living Family/patient expects to be discharged to:: Private residence Living Arrangements: Spouse/significant other Available Help at Discharge: Family;Available 24 hours/day Type of Home: House Home Access: Stairs to enter   Entergy CorporationEntrance Stairs-Number of Steps: 2 Home Layout: One level Home Equipment: None      Prior Function Level of Independence: Independent         Comments: 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 Enjoys yard work     Higher education careers adviser   Dominant Hand: Right    Extremity/Trunk Assessment   Upper Extremity Assessment Upper Extremity Assessment: RUE deficits/detail RUE Deficits / Details: Pt with Rt hemiparesis.  He initially did not attempt to move Rt UE, however, as session progressed, he demonstrated some spontaneous  movement.  He was able to perform mass grasp and mass extension of Rt hand as well as small excursion of shoulder flexion and elbow flexion/extension   RUE Coordination: decreased fine motor;decreased gross motor    Lower Extremity Assessment Lower Extremity Assessment: RLE deficits/detail RLE Deficits / Details: 1+/5 quad strength, 0/5 distally RLE Coordination: decreased gross motor    Cervical / Trunk Assessment Cervical / Trunk Assessment: Other exceptions Cervical / Trunk Exceptions: Rt passive lateral flexion of trunk with decreased isolated movement of lower and upper trunk   Communication   Communication: Expressive difficulties(slightly dysarthric)  Cognition Arousal/Alertness: Awake/alert Behavior During Therapy: WFL for tasks assessed/performed Overall Cognitive Status: No family/caregiver present to determine baseline cognitive functioning Area of Impairment: Attention;Safety/judgement;Awareness;Problem solving                   Current Attention Level: Selective     Safety/Judgement: Decreased awareness of safety;Decreased awareness of deficits Awareness: Intellectual;Emergent Problem Solving: Slow processing;Requires verbal cues;Requires tactile cues General Comments: Pt is a bit slow to respond.  He is aware of Rt sided weakness, but does not have good awareness of impact on function.  He requires min cues for sequencing and problem solving       General Comments General comments (skin integrity, edema, etc.): VSS     Exercises Other Exercises Other Exercises: Pt instructed to work on finger extension as well as lap slides    Assessment/Plan    PT Assessment Patient needs continued PT services  PT Problem List Decreased strength;Decreased activity tolerance;Decreased balance;Decreased mobility;Decreased coordination;Decreased cognition;Decreased safety awareness       PT Treatment Interventions Gait training;DME instruction;Functional mobility  training;Therapeutic activities;Therapeutic exercise;Balance training;Neuromuscular re-education;Cognitive remediation;Patient/family education    PT Goals (Current goals can be found in the Care Plan section)  Acute Rehab PT Goals Patient Stated Goal: to be able to mow lawns again  PT Goal Formulation: With patient Time For Goal Achievement: 06/13/18 Potential to Achieve Goals: Good    Frequency Min 4X/week   Barriers to discharge        Co-evaluation PT/OT/SLP Co-Evaluation/Treatment: Yes Reason for Co-Treatment: For patient/therapist safety;To address functional/ADL transfers PT goals addressed during session: Mobility/safety with mobility;Balance         AM-PAC PT "6 Clicks" Mobility  Outcome Measure Help needed turning from your back to your side while in a flat bed without using bedrails?: A Little Help needed moving from lying on your back to sitting on the side of a flat bed without using bedrails?: A Lot Help needed moving to and from a bed to a chair (including a wheelchair)?: A Lot Help needed standing up from a chair using your arms (e.g., wheelchair or bedside chair)?: Total Help needed to walk in hospital room?: Total Help needed climbing 3-5 steps with a railing? : Total 6 Click Score: 10    End of Session Equipment Utilized During Treatment: Gait belt Activity Tolerance: Patient tolerated treatment well Patient left: in chair;with call bell/phone within reach;with chair alarm set Nurse Communication: Mobility status PT Visit Diagnosis: Other symptoms and signs involving the nervous system (R29.898);Other abnormalities of gait and mobility (  R26.89);Hemiplegia and hemiparesis;Unsteadiness on feet (R26.81) Hemiplegia - Right/Left: Right Hemiplegia - dominant/non-dominant: Dominant Hemiplegia - caused by: Cerebral infarction    Time: 1235-1309 PT Time Calculation (min) (ACUTE ONLY): 34 min   Charges:   PT Evaluation $PT Eval Moderate Complexity: 1 Mod           Laurina Bustle, [place], DPT Acute Rehabilitation Services Pager [phone] Office [phone]   Vanetta Mulders 05/30/2018, 2:53 PM

## 2018-05-30 NOTE — Evaluation (Signed)
Occupational Therapy Evaluation Patient Details Name: Joe Wood MRN: [id number] DOB: [birth year] Today's Date: 05/30/2018    History of Present Illness Pt is a 68 y.o. M with significant PMH of hypertension with questionable complaince to antihypertensives. Presents with right arm leg weakness, facial weakness and slurred speech. IV tPA was administered. 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   Clinical Impression   Pt admitted with above. He demonstrates the below listed deficits and will benefit from continued OT to maximize safety and independence with BADLs.  Pt presents to OT with Rt hemiparesis, impaired balance, questionable higher level cognitive deficits.  He currently requires min - max A for ADLs, and mod A - max A +2 for functional transfers.  He is very motivated and demonstrates improved movement and function of Rt side during eval.   PTA, he lived with wife and was very active including mowing lawns.  He is a retired Administrator, [employer] at [employer], and is very motivated.  Recommend CIR as I anticipate he will make good progress with the intensity, structure of CIR which will reduce his risk of injury, falls, and allow him to safely return home with wife.        Follow Up Recommendations  CIR    Equipment Recommendations  None recommended by OT    Recommendations for Other Services Rehab consult     Precautions / Restrictions Precautions Precautions: Fall Restrictions Weight Bearing Restrictions: No      Mobility Bed Mobility Overal bed mobility: Needs Assistance Bed Mobility: Supine to Sit     Supine to sit: Mod assist     General bed mobility comments: Pt requires assist to initiate movement of Rt LE off bed and to lift shoulders.     Transfers Overall transfer level: Needs assistance Equipment used: 2 person hand held assist Transfers: Sit to/from [employer] Sit to Stand:  Mod assist;Max assist;+2 physical assistance;+2 safety/equipment Stand pivot transfers: Mod assist;+2 physical assistance;+2 safety/equipment       General transfer comment: Pt required max A +2 for initial standing due to Rt knee buckling.  He tends to overuse Lt side in attempts to activate the Rt.   On second attempt at standing, he demonstrated improved abillity to with mod  A +2 with less knee buckling and facilitation of Lt gluts.  he was able to pivot to the Lt with faciliation to maintain trunk and hip extension as well as assist to advance and pivot with Rt foot     Balance Overall balance assessment: Needs assistance Sitting-balance support: Feet supported Sitting balance-Leahy Scale: Poor Sitting balance - Comments: Pt requires min A, progressing to close min guard assist  for static sitting  Postural control: Right lateral lean Standing balance support: Single extremity supported Standing balance-Leahy Scale: Poor Standing balance comment: requires max A, progressing to mod A +2 for static standing                            ADL either performed or assessed with clinical judgement   ADL Overall ADL's : Needs assistance/impaired Eating/Feeding: Set up;Sitting   Grooming: Wash/dry hands;Wash/dry face;Oral care;Brushing hair;Minimal assistance;Sitting   Upper Body Bathing: Moderate assistance;Sitting   Lower Body Bathing: Sit to/from stand;Maximal assistance   Upper Body Dressing : Moderate assistance;Sitting   Lower Body Dressing: Maximal assistance;Sit to/from stand   Toilet Transfer: Maximal assistance;+2 for physical  assistance;+2 for safety/equipment;Stand-pivot;BSC   Toileting- Clothing Manipulation and Hygiene: Total assistance;Sit to/from stand       Functional mobility during ADLs: Moderate assistance;Maximal assistance;+2 for physical assistance;+2 for safety/equipment       Vision Baseline Vision/History: Wears glasses Wears Glasses: Reading  only Patient Visual Report: No change from baseline(reports he feels vision is brighter ) Vision Assessment?: Yes Eye Alignment: Within Functional Limits Ocular Range of Motion: Within Functional Limits Alignment/Gaze Preference: Within Defined Limits Additional Comments: Will require further assessment      Perception Perception Perception Tested?: Yes   Praxis Praxis Praxis tested?: Within functional limits    Pertinent Vitals/Pain Pain Assessment: No/denies pain     Hand Dominance Right   Extremity/Trunk Assessment Upper Extremity Assessment Upper Extremity Assessment: RUE deficits/detail RUE Deficits / Details: Pt with Rt hemiparesis.  He initially did not attempt to move Rt UE, however, as session progressed, he demonstrated some spontaneous movement.  He was able to perform mass grasp and mass extension of Rt hand as well as small excursion of shoulder flexion and elbow flexion/extension   RUE Coordination: decreased fine motor;decreased gross motor   Lower Extremity Assessment Lower Extremity Assessment: Defer to PT evaluation   Cervical / Trunk Assessment Cervical / Trunk Assessment: Other exceptions Cervical / Trunk Exceptions: Rt passive lateral flexion of trunk with decreased isolated movement of lower and upper trunk    Communication Communication Communication: Expressive difficulties(slightly dysarthric)   Cognition Arousal/Alertness: Awake/alert Behavior During Therapy: WFL for tasks assessed/performed Overall Cognitive Status: No family/caregiver present to determine baseline cognitive functioning Area of Impairment: Attention;Safety/judgement;Awareness;Problem solving                   Current Attention Level: Selective     Safety/Judgement: Decreased awareness of safety;Decreased awareness of deficits Awareness: Intellectual;Emergent Problem Solving: Slow processing;Requires verbal cues;Requires tactile cues General Comments: Pt is a bit slow  to respond.  He is aware of Rt sided weakness, but does not have good awareness of impact on function.  He requires min cues for sequencing and problem solving    General Comments  VSS     Exercises Exercises: Other exercises Other Exercises Other Exercises: Pt instructed to work on finger extension as well as lap slides    Shoulder Instructions      Home Living Family/patient expects to be discharged to:: Private residence Living Arrangements: Spouse/significant other Available Help at Discharge: Family;Available 24 hours/day Type of Home: House Home Access: Stairs to enter [employer] of Steps: 2   Home Layout: One level     Bathroom Shower/Tub: Chief Strategy Officer: Standard     Home Equipment: None          Prior Functioning/Environment Level of Independence: Independent        Comments: Retired Administrator, [employer] at [employer]. Enjoys yard work        OT Problem List: Decreased strength;Decreased range of motion;Decreased activity tolerance;Impaired balance (sitting and/or standing);Impaired vision/perception;Decreased coordination;Decreased safety awareness;Decreased cognition;Impaired tone;Impaired UE functional use      OT Treatment/Interventions: Self-care/ADL training;Neuromuscular education;DME and/or AE instruction;Therapeutic activities;Cognitive remediation/compensation;Visual/perceptual remediation/compensation;Patient/family education;Balance training;Manual therapy    OT Goals(Current goals can be found in the care plan section) Acute Rehab OT Goals Patient Stated Goal: to be able to mow lawns again  OT Goal Formulation: With patient Time For Goal Achievement: 06/13/18 Potential to Achieve Goals: Good ADL Goals Pt Will Perform Grooming: with min assist;standing Pt Will Perform Upper Body Bathing: with min  assist;sitting Pt Will Perform Lower Body Bathing: with min assist;sit to/from stand Pt Will Perform Upper Body Dressing:  with min assist;sitting Pt Will Perform Lower Body Dressing: with mod assist;sit to/from stand Pt Will Transfer to Toilet: stand pivot transfer;with min assist;bedside commode Pt Will Perform Toileting - Clothing Manipulation and hygiene: with min assist;sit to/from stand Pt/caregiver will Perform Home Exercise Program: Increased ROM;Increased strength;Right Upper extremity;With written HEP provided;With Supervision Additional ADL Goal #1: Pt will use Rt UE as a gross assist consistently during ADLs  OT Frequency: Min 2X/week   Barriers to D/C:            Co-evaluation              AM-PAC OT "6 Clicks" Daily Activity     Outcome Measure Help from another person eating meals?: A Little Help from another person taking care of personal grooming?: A Little Help from another person toileting, which includes using toliet, bedpan, or urinal?: A Lot Help from another person bathing (including washing, rinsing, drying)?: A Lot Help from another person to put on and taking off regular upper body clothing?: A Lot Help from another person to put on and taking off regular lower body clothing?: Total 6 Click Score: 13   End of Session Equipment Utilized During Treatment: Gait belt Nurse Communication: Mobility status  Activity Tolerance: Patient tolerated treatment well Patient left: in chair;with call bell/phone within reach;with chair alarm set  OT Visit Diagnosis: Hemiplegia and hemiparesis Hemiplegia - Right/Left: Right Hemiplegia - dominant/non-dominant: Dominant Hemiplegia - caused by: Cerebral infarction                Time: [id number] OT Time Calculation (min): 31 min Charges:  OT General Charges $OT Visit: 1 Visit OT Evaluation $OT Eval Moderate Complexity: 1 Mod  Jeani HawkingWendi Quantavia Frith, OTR/L Acute Rehabilitation Services Pager [phone] Office [phone]   Jeani HawkingConarpe, Ethel Veronica M 05/30/2018, 2:27 PM

## 2018-05-31 DIAGNOSIS — I63413 Cerebral infarction due to embolism of bilateral middle cerebral arteries: Secondary | ICD-10-CM

## 2018-05-31 LAB — BASIC METABOLIC PANEL
Anion gap: 10 (ref 5–15)
BUN: 12 mg/dL (ref 8–23)
CO2: 23 mmol/L (ref 22–32)
Calcium: 9.7 mg/dL (ref 8.9–10.3)
Chloride: 102 mmol/L (ref 98–111)
Creatinine, Ser: 0.92 mg/dL (ref 0.61–1.24)
GFR calc Af Amer: >60 mL/min (ref >60)
GFR calc non Af Amer: >60 mL/min (ref >60)
Glucose, Bld: 179 mg/dL — ABNORMAL HIGH (ref 70–99)
Potassium: 3.7 mmol/L (ref 3.5–5.1)
Sodium: 135 mmol/L (ref 135–145)

## 2018-05-31 LAB — CBC
HCT: 44.9 % (ref 39.0–52.0)
Hemoglobin: 15.4 g/dL (ref 13.0–17.0)
MCH: 28.2 pg (ref 26.0–34.0)
MCHC: 34.3 g/dL (ref 30.0–36.0)
MCV: 82.2 fL (ref 80.0–100.0)
Platelets: 303 10*3/uL (ref 150–400)
RBC: 5.46 MIL/uL (ref 4.22–5.81)
RDW: 13 % (ref 11.5–15.5)
WBC: 7.1 10*3/uL (ref 4.0–10.5)
nRBC: 0 % (ref 0.0–0.2)

## 2018-05-31 LAB — GLUCOSE, CAPILLARY
Glucose-Capillary: 129 mg/dL — ABNORMAL HIGH (ref 70–99)
Glucose-Capillary: 143 mg/dL — ABNORMAL HIGH (ref 70–99)
Glucose-Capillary: 161 mg/dL — ABNORMAL HIGH (ref 70–99)
Glucose-Capillary: 199 mg/dL — ABNORMAL HIGH (ref 70–99)

## 2018-05-31 NOTE — Progress Notes (Signed)
STROKE TEAM PROGRESS NOTE   INTERVAL HISTORY Patient lying in bed, awake alert.   MRI showed left BG/CR and punctate right BG infarcts.  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  Cerebral angiogram on Monday.  Vitals:   05/31/18 0645 05/31/18 0700 05/31/18 0715 05/31/18 0800  BP: 137/82 (!) 160/93 121/85 135/90  Pulse: 76 77 73 76  Resp: 20 15 17 18   Temp:    98.2 F (36.8 C)  TempSrc:    Oral  SpO2: 90% 91% 93% 93%  Weight:      Height:        CBC:  Recent Labs  Lab 05/29/18 0606 05/30/18 0250 05/31/18 0443  WBC 5.9 7.8 7.1  NEUTROABS 2.7  --   --   HGB 14.9 16.3 15.4  HCT 43.8 47.6 44.9  MCV 83.0 81.4 82.2  PLT 278 283 303    Basic Metabolic Panel:  Recent Labs  Lab 05/30/18 0250 05/31/18 0443  NA 137 135  K 3.6 3.7  CL 97* 102  CO2 25 23  GLUCOSE 209* 179*  BUN 10 12  CREATININE 0.90 0.92  CALCIUM 9.9 9.7   Lipid Panel:     Component Value Date/Time   CHOL 233 (H) 05/30/2018 0250   TRIG 259 (H) 05/30/2018 0250   TRIG 251 (H) 05/30/2018 0250   HDL 62 05/30/2018 0250   CHOLHDL 3.8 05/30/2018 0250   VLDL 52 (H) 05/30/2018 0250   LDLCALC 119 (H) 05/30/2018 0250   HgbA1c:  Lab Results  Component Value Date   HGBA1C 6.5 (H) 05/30/2018   Urine Drug Screen:     Component Value Date/Time   LABOPIA NONE DETECTED 05/29/2018 1650   COCAINSCRNUR NONE DETECTED 05/29/2018 1650   LABBENZ NONE DETECTED 05/29/2018 1650   AMPHETMU NONE DETECTED 05/29/2018 1650   THCU NONE DETECTED 05/29/2018 1650   LABBARB NONE DETECTED 05/29/2018 1650    Alcohol Level     Component Value Date/Time   ETH <10 05/29/2018 0606    IMAGING  Ct Angio Head W Or Wo Contrast Ct Angio Neck W Or Wo Contrast 05/29/2018 IMPRESSION:  1. No emergent large vessel occlusion.  2. Small basilar in the setting of bilateral fetal type PCA with severe stenosis of the mid basilar giving an apparent flow gap. No flow is seen within the left vertebral artery  until the distal V4 segment.  3. Cervical and intracranial carotid atherosclerosis without flow limiting stenosis.   Mr Maxine GlennMra Head Wo Contrast 05/30/2018 IMPRESSION:  1. Acute perforator infarct in the left corona radiata.  2. 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  3. 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  4. Intracranial MRA is stable from CTA yesterday. 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   Ct Head Code Stroke Wo Contrast 05/29/2018 IMPRESSION:  1. Age-indeterminate lacunar infarct in the left thalamus.  2. Negative for acute hemorrhage.  3. Motion degraded.    PHYSICAL EXAM  Temp:  [98.1 F (36.7 C)-99 F (37.2 C)] 98.2 F (36.8 C) (05/09 0800) Pulse Rate:  [62-96] 76 (05/09 0800) Resp:  [11-24] 18 (05/09 0800) BP: (112-193)/(69-124) 135/90 (05/09 0800) SpO2:  [88 %-99 %] 93 % (05/09 0800)  General - Well nourished, well developed, in no apparent distress.  Ophthalmologic - fundi not visualized due to noncooperation.  Cardiovascular - Regular rate and rhythm.  Mental Status -  Level of arousal and orientation to  time, place, and person were intact. Language including expression, naming, repetition, comprehension was assessed and found intact.  Mild to moderate dysarthria Fund of Knowledge was assessed and was intact.  Cranial Nerves II - XII - II - Visual field intact OU. III, IV, VI - Extraocular movements intact. V - Facial sensation intact bilaterally. VII - right facial droop. VIII - Hearing & vestibular intact bilaterally. X - Palate elevates symmetrically, mild to moderate dysarthria. XI - Chin turning & shoulder shrug intact bilaterally. XII - Tongue protrusion intact.  Motor Strength - RUE able to wiggle some in fingers and weak grip.  No movement in biceps, triceps, deltoid.  RLE 1/5.  Right babinksi.  Left side 5/5.     Reflexes - The patient's reflexes were symmetrical in all  extremities and he had no pathological reflexes.  Sensory - Light touch, temperature/pinprick were assessed and were symmetrical.    Coordination - The patient had normal movements in left hand with no ataxia or dysmetria.  Tremor was absent.  Gait and Station - deferred.   ASSESSMENT/PLAN Joe Wood is a 68 y.o. male with history of Htn with possible noncompliance presenting with R sided weakness and slurred speech w/ SBP 240s.  Received IV tPA 05/29/2018 at 0627.   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  Code Stroke CT head age indeterminate L thalamic infarct. ny hmg. Motion degraded.  ASPECTS 10.     CTA head & neck no ELVO. Small basilar, B fetal PCAs w/ severe stenosis mid BA. No flow versus hypoplastic L VA.  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 fib given separate infarcts  LDL 119   HgbA1c 6.5  UDS negative  Lovenox for VTE prophylaxis  aspirin 81 mg daily prior to admission, now on aspirin and Plavix DAPT for 3 weeks and then Plavix alone.  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 Corliss Skains  N.p.o. 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  Hyperlipidemia  Home meds:  lipitor 40, not compliant  Resume lipitor 40  LDL 119, goal < 70  Continue statin at discharge  Diabetes, new diagnosis  A1c 6.5  DM coordinator on board  Hyperglycemia  SSI  CBG monitoring  Close PCP follow-up and diabetes control  Alcohol abuse  Patient reported 3-4 beers per day  advised to drink no more than 2 drink(s) a day  Will start CIWA protocol  FA/B1/MVI  Other Stroke Risk Factors  Advanced age  Other Active Problems  AKI, Cr 1.36->1.20->0.90->0.92   PLAN  Cerebral angiogram Monday Dr Corliss Skains to evaluate basilar artery  BP control via Metoprolol and Norvasc orally and Cleviprex gtt prn.  Will try to wean off gtt over the weekend.   Weston Settle, MS, MD  Hospital day # 2  This patient is critically ill due to acute stroke, status post TPA, posterior circulation atherosclerosis versus hypoplastic, hypertensive emergency, new diagnosed diabetes and at significant risk of neurological worsening, death form recurrent stroke, hemorrhage conversion, seizure, renal failure. This patient's care requires constant monitoring of vital signs, hemodynamics, respiratory and cardiac monitoring, review of multiple databases, neurological assessment, discussion with family, other specialists and medical decision making of high complexity. I spent 35 minutes of neurocritical care time in the care of this patient.     To contact Stroke Continuity provider, please refer to [email or web address]. After hours, contact General Neurology

## 2018-05-31 NOTE — Progress Notes (Signed)
Assisted tele visit to patient with wife.  Kiet Geer P, RN  

## 2018-06-01 LAB — BASIC METABOLIC PANEL
Anion gap: 14 (ref 5–15)
BUN: 14 mg/dL (ref 8–23)
CO2: 25 mmol/L (ref 22–32)
Calcium: 9.8 mg/dL (ref 8.9–10.3)
Chloride: 97 mmol/L — ABNORMAL LOW (ref 98–111)
Creatinine, Ser: 1.03 mg/dL (ref 0.61–1.24)
GFR calc Af Amer: >60 mL/min (ref >60)
GFR calc non Af Amer: >60 mL/min (ref >60)
Glucose, Bld: 154 mg/dL — ABNORMAL HIGH (ref 70–99)
Potassium: 3.7 mmol/L (ref 3.5–5.1)
Sodium: 136 mmol/L (ref 135–145)

## 2018-06-01 LAB — CBC
HCT: 45.1 % (ref 39.0–52.0)
Hemoglobin: 15.3 g/dL (ref 13.0–17.0)
MCH: 28.1 pg (ref 26.0–34.0)
MCHC: 33.9 g/dL (ref 30.0–36.0)
MCV: 82.8 fL (ref 80.0–100.0)
Platelets: 282 10*3/uL (ref 150–400)
RBC: 5.45 MIL/uL (ref 4.22–5.81)
RDW: 13 % (ref 11.5–15.5)
WBC: 8.3 10*3/uL (ref 4.0–10.5)
nRBC: 0 % (ref 0.0–0.2)

## 2018-06-01 LAB — GLUCOSE, CAPILLARY
Glucose-Capillary: 165 mg/dL — ABNORMAL HIGH (ref 70–99)
Glucose-Capillary: 195 mg/dL — ABNORMAL HIGH (ref 70–99)
Glucose-Capillary: 148 mg/dL — ABNORMAL HIGH (ref 70–99)

## 2018-06-01 NOTE — Progress Notes (Signed)
Assisted tele visit to patient with wife.  Etherine Mackowiak P, RN  

## 2018-06-01 NOTE — Progress Notes (Signed)
STROKE TEAM PROGRESS NOTE   INTERVAL HISTORY Patient lying in bed, awake alert.   MRI showed left BG/CR and punctate right BG infarcts.  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  Cerebral angiogram on Monday.  Cleviprex at 2 mg/hour.  Vitals:   06/01/18 0400 06/01/18 0500 06/01/18 0600 06/01/18 0700  BP: 118/83 (!) 141/99 (!) 142/87 (!) 148/99  Pulse: 66 79 69 80  Resp: 14 20 17 14   Temp:      TempSrc:      SpO2: (!) 89% 92% 93% 96%  Weight:      Height:        CBC:  Recent Labs  Lab 05/29/18 0606  05/31/18 0443 06/01/18 0253  WBC 5.9   < > 7.1 8.3  NEUTROABS 2.7  --   --   --   HGB 14.9   < > 15.4 15.3  HCT 43.8   < > 44.9 45.1  MCV 83.0   < > 82.2 82.8  PLT 278   < > 303 282   < > = values in this interval not displayed.    Basic Metabolic Panel:  Recent Labs  Lab 05/31/18 0443 06/01/18 0253  NA 135 136  K 3.7 3.7  CL 102 97*  CO2 23 25  GLUCOSE 179* 154*  BUN 12 14  CREATININE 0.92 1.03  CALCIUM 9.7 9.8   Lipid Panel:     Component Value Date/Time   CHOL 233 (H) 05/30/2018 0250   TRIG 259 (H) 05/30/2018 0250   TRIG 251 (H) 05/30/2018 0250   HDL 62 05/30/2018 0250   CHOLHDL 3.8 05/30/2018 0250   VLDL 52 (H) 05/30/2018 0250   LDLCALC 119 (H) 05/30/2018 0250   HgbA1c:  Lab Results  Component Value Date   HGBA1C 6.5 (H) 05/30/2018   Urine Drug Screen:     Component Value Date/Time   LABOPIA NONE DETECTED 05/29/2018 1650   COCAINSCRNUR NONE DETECTED 05/29/2018 1650   LABBENZ NONE DETECTED 05/29/2018 1650   AMPHETMU NONE DETECTED 05/29/2018 1650   THCU NONE DETECTED 05/29/2018 1650   LABBARB NONE DETECTED 05/29/2018 1650    Alcohol Level     Component Value Date/Time   ETH <10 05/29/2018 0606    IMAGING  Ct Angio Head W Or Wo Contrast Ct Angio Neck W Or Wo Contrast 05/29/2018 IMPRESSION:  1. No emergent large vessel occlusion.  2. Small basilar in the setting of bilateral fetal type PCA with severe  stenosis of the mid basilar giving an apparent flow gap. No flow is seen within the left vertebral artery until the distal V4 segment.  3. Cervical and intracranial carotid atherosclerosis without flow limiting stenosis.   Mr Joe Wood Head Wo Contrast 05/30/2018 IMPRESSION:  1. Acute perforator infarct in the left corona radiata.  2. 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  3. 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  4. Intracranial MRA is stable from CTA yesterday. 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   Ct Head Code Stroke Wo Contrast 05/29/2018 IMPRESSION:  1. Age-indeterminate lacunar infarct in the left thalamus.  2. Negative for acute hemorrhage.  3. Motion degraded.    PHYSICAL EXAM  Temp:  [98.2 F (36.8 C)-98.5 F (36.9 C)] 98.5 F (36.9 C) (05/10 0353) Pulse Rate:  [61-97] 80 (05/10 0700) Resp:  [10-24] 14 (05/10 0700) BP: (118-187)/(76-130) 148/99 (05/10 0700) SpO2:  [89 %-100 %] 96 % (  05/10 0700)  General - Well nourished, well developed, in no apparent distress.  Ophthalmologic - fundi not visualized due to noncooperation.  Cardiovascular - Regular rate and rhythm.  Mental Status -  Level of arousal and orientation to time, place, and person were intact. Language including expression, naming, repetition, comprehension was assessed and found intact.  Mild to moderate dysarthria Fund of Knowledge was assessed and was intact.  Cranial Nerves II - XII - II - Visual field intact OU. III, IV, VI - Extraocular movements intact. V - Facial sensation intact bilaterally. VII - right facial droop. VIII - Hearing & vestibular intact bilaterally. X - Palate elevates symmetrically, mild to moderate dysarthria. XI - Chin turning & shoulder shrug intact bilaterally. XII - Tongue protrusion intact.  Motor Strength - RUE able to wiggle some in fingers and weak grip.  No movement in biceps, triceps, deltoid.  RLE  1/5.  Right babinksi.  Left side 5/5.     Reflexes - The patient's reflexes were symmetrical in all extremities and he had no pathological reflexes.  Sensory - Light touch, temperature/pinprick were assessed and were symmetrical.    Coordination - The patient had normal movements in left hand with no ataxia or dysmetria.  Tremor was absent.  Gait and Station - deferred.   ASSESSMENT/PLAN [name] is a 68 y.o. male with history of Htn with possible noncompliance presenting with R sided weakness and slurred speech w/ SBP 240s.  Received IV tPA 05/29/2018 at 0627.   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  Code Stroke CT head age indeterminate L thalamic infarct. ny hmg. Motion degraded.  ASPECTS 10.     CTA head & neck no ELVO. Small basilar, B fetal PCAs w/ severe stenosis mid BA. No flow versus hypoplastic L VA.  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 fib given separate infarcts  LDL 119   HgbA1c 6.5  UDS negative  Lovenox for VTE prophylaxis  aspirin 81 mg daily prior to admission, now on aspirin and Plavix DAPT for 3 weeks and then Plavix alone.  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 Corliss Skains  N.p.o. 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  Hyperlipidemia  Home meds:  lipitor 40, not compliant  Resume lipitor 40  LDL 119, goal < 70  Continue statin at discharge  Diabetes, new diagnosis  A1c 6.5  DM coordinator on board  Hyperglycemia  SSI  CBG monitoring  Close PCP follow-up and diabetes control  Alcohol abuse  Patient reported 3-4 beers per day  advised to drink no more than 2  drink(s) a day  Will start CIWA protocol  FA/B1/MVI  Other Stroke Risk Factors  Advanced age  Other Active Problems  AKI, Cr 1.36->1.20->0.90->0.92   PLAN  Cerebral angiogram Monday Dr Corliss Skainseveshwar to evaluate basilar artery   BP control via Metoprolol and Norvasc orally and Cleviprex gtt prn.  Will try to wean off gtt over the weekend.   Weston SettleShervin Mishon Blubaugh, MS, MD  Hospital day # 3  This patient is critically ill due to acute stroke, status post TPA, posterior circulation atherosclerosis versus hypoplastic, hypertensive emergency, new diagnosed diabetes and at significant risk of neurological worsening, death form recurrent stroke, hemorrhage conversion, seizure, renal failure. This patient's care requires constant monitoring of vital signs, hemodynamics, respiratory and cardiac monitoring, review of multiple databases, neurological assessment, discussion with family, other specialists and medical decision making of high complexity. I spent 35 minutes of neurocritical care time in the care of this patient.     To contact Stroke Continuity provider, please refer to [email or web address]. After hours, contact General Neurology

## 2018-06-02 ENCOUNTER — Inpatient Hospital Stay (HOSPITAL_COMMUNITY): Payer: Medicare Other

## 2018-06-02 ENCOUNTER — Other Ambulatory Visit: Payer: Self-pay | Admitting: Interventional Radiology

## 2018-06-02 HISTORY — PX: IR ANGIOGRAM EXTREMITY LEFT: IMG651

## 2018-06-02 HISTORY — PX: IR ANGIO INTRA EXTRACRAN SEL COM CAROTID INNOMINATE BILAT MOD SED: IMG5360

## 2018-06-02 HISTORY — PX: IR ANGIO VERTEBRAL SEL SUBCLAVIAN INNOMINATE UNI R MOD SED: IMG5365

## 2018-06-02 LAB — GLUCOSE, CAPILLARY
Glucose-Capillary: 134 mg/dL — ABNORMAL HIGH (ref 70–99)
Glucose-Capillary: 134 mg/dL — ABNORMAL HIGH (ref 70–99)
Glucose-Capillary: 149 mg/dL — ABNORMAL HIGH (ref 70–99)
Glucose-Capillary: 157 mg/dL — ABNORMAL HIGH (ref 70–99)
Glucose-Capillary: 158 mg/dL — ABNORMAL HIGH (ref 70–99)
Glucose-Capillary: 169 mg/dL — ABNORMAL HIGH (ref 70–99)

## 2018-06-02 LAB — BASIC METABOLIC PANEL
Anion gap: 14 (ref 5–15)
BUN: 18 mg/dL (ref 8–23)
CO2: 25 mmol/L (ref 22–32)
Calcium: 9.6 mg/dL (ref 8.9–10.3)
Chloride: 97 mmol/L — ABNORMAL LOW (ref 98–111)
Creatinine, Ser: 1.04 mg/dL (ref 0.61–1.24)
GFR calc Af Amer: >60 mL/min (ref >60)
GFR calc non Af Amer: >60 mL/min (ref >60)
Glucose, Bld: 177 mg/dL — ABNORMAL HIGH (ref 70–99)
Potassium: 4.2 mmol/L (ref 3.5–5.1)
Sodium: 136 mmol/L (ref 135–145)

## 2018-06-02 LAB — CBC
HCT: 43.8 % (ref 39.0–52.0)
Hemoglobin: 14.7 g/dL (ref 13.0–17.0)
MCH: 27.9 pg (ref 26.0–34.0)
MCHC: 33.6 g/dL (ref 30.0–36.0)
MCV: 83.1 fL (ref 80.0–100.0)
Platelets: 283 10*3/uL (ref 150–400)
RBC: 5.27 MIL/uL (ref 4.22–5.81)
RDW: 13 % (ref 11.5–15.5)
WBC: 7.3 10*3/uL (ref 4.0–10.5)
nRBC: 0 % (ref 0.0–0.2)

## 2018-06-02 MED ORDER — HYDRALAZINE HCL 20 MG/ML IJ SOLN
INTRAMUSCULAR | Status: AC
  Filled 2018-06-02: qty 1

## 2018-06-02 MED ORDER — FENTANYL CITRATE (PF) 100 MCG/2ML IJ SOLN
INTRAMUSCULAR | Status: AC | PRN
  Administered 2018-06-02: 25 ug via INTRAVENOUS

## 2018-06-02 MED ORDER — LIDOCAINE HCL 1 % IJ SOLN
INTRAMUSCULAR | Status: AC | PRN
  Administered 2018-06-02: 10 mL

## 2018-06-02 MED ORDER — MIDAZOLAM HCL 2 MG/2ML IJ SOLN
INTRAMUSCULAR | Status: AC | PRN
  Administered 2018-06-02: 1 mg via INTRAVENOUS

## 2018-06-02 MED ORDER — HEPARIN SODIUM (PORCINE) 1000 UNIT/ML IJ SOLN
INTRAMUSCULAR | Status: AC
  Filled 2018-06-02: qty 2

## 2018-06-02 MED ORDER — SODIUM CHLORIDE 0.9 % IV BOLUS
500.0000 mL | Freq: Once | INTRAVENOUS | Status: AC
  Administered 2018-06-02: 500 mL via INTRAVENOUS

## 2018-06-02 MED ORDER — IOHEXOL 300 MG/ML  SOLN
150.0000 mL | Freq: Once | INTRAMUSCULAR | Status: AC | PRN
  Administered 2018-06-02: 50 mL via INTRA_ARTERIAL

## 2018-06-02 MED ORDER — LIDOCAINE HCL 1 % IJ SOLN
INTRAMUSCULAR | Status: AC
  Filled 2018-06-02: qty 20

## 2018-06-02 MED ORDER — SODIUM CHLORIDE 0.9 % IV SOLN
INTRAVENOUS | Status: AC | PRN
  Administered 2018-06-02: 75 mL/h via INTRAVENOUS

## 2018-06-02 MED ORDER — FENTANYL CITRATE (PF) 100 MCG/2ML IJ SOLN
INTRAMUSCULAR | Status: AC
  Filled 2018-06-02: qty 2

## 2018-06-02 MED ORDER — INSULIN ASPART 100 UNIT/ML ~~LOC~~ SOLN
0.0000 [IU] | SUBCUTANEOUS | Status: DC
  Administered 2018-06-02 – 2018-06-03 (×5): 1 [IU] via SUBCUTANEOUS
  Administered 2018-06-03: 12:00:00 3 [IU] via SUBCUTANEOUS
  Administered 2018-06-03 – 2018-06-04 (×3): 1 [IU] via SUBCUTANEOUS
  Administered 2018-06-04: 22:00:00 2 [IU] via SUBCUTANEOUS
  Administered 2018-06-04: 1 [IU] via SUBCUTANEOUS
  Administered 2018-06-04: 3 [IU] via SUBCUTANEOUS
  Administered 2018-06-04: 17:00:00 2 [IU] via SUBCUTANEOUS
  Administered 2018-06-05 (×4): 1 [IU] via SUBCUTANEOUS

## 2018-06-02 MED ORDER — HEPARIN SODIUM (PORCINE) 1000 UNIT/ML IJ SOLN
INTRAMUSCULAR | Status: AC | PRN
  Administered 2018-06-02: 1000 [IU] via INTRAVENOUS

## 2018-06-02 MED ORDER — LORAZEPAM 2 MG/ML IJ SOLN
1.0000 mg | Freq: Once | INTRAMUSCULAR | Status: AC
  Administered 2018-06-02: 1 mg via INTRAVENOUS

## 2018-06-02 MED ORDER — SODIUM CHLORIDE 0.9 % IV SOLN
INTRAVENOUS | Status: DC
  Administered 2018-06-02 – 2018-06-04 (×3): via INTRAVENOUS

## 2018-06-02 MED ORDER — SODIUM CHLORIDE 0.9 % IV SOLN
INTRAVENOUS | Status: AC
  Administered 2018-06-02: 13:00:00 via INTRAVENOUS

## 2018-06-02 MED ORDER — MIDAZOLAM HCL 2 MG/2ML IJ SOLN
INTRAMUSCULAR | Status: AC
  Filled 2018-06-02: qty 2

## 2018-06-02 MED ORDER — HYDRALAZINE HCL 20 MG/ML IJ SOLN
INTRAMUSCULAR | Status: AC | PRN
  Administered 2018-06-02: 5 mg via INTRAVENOUS

## 2018-06-02 NOTE — Procedures (Signed)
S/P bilateral CCA ,RT VA and Lt subclavian arteriograms. RT CFA approach. Findings. 1.Severe pre occlusive midbasilar artery stenosis. 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 3.Approx 50 % stenosis of LT MCA M 1

## 2018-06-02 NOTE — Progress Notes (Signed)
STROKE TEAM PROGRESS NOTE   INTERVAL HISTORY Patient was examined earlier today during rounds and was sitting up in a bedside chair.  He was awake and alert with dysarthric speech with right hemiplegia with arm greater than leg weakness.  Subsequently went for diagnostic cerebral catheter angiogram performed by Dr.Deveshwar which showed near occlusive basilar artery stenosis.  However after returning to the neuro ICU patient was noted to have neurological change was found to have increased dysarthria, right hemiparesis as well as some involuntary movements of the left hand face and lower extremities.  I was called back to examine this patient and found him to have increased slurred speech and right hemiplegia as well as having minor choreiform movements involving his lips left hand and left toes which he could stop voluntarily to some extent.  Stat CT scan of the head was obtained which showed no acute abnormality hemorrhage and stable findings.  Patient was given bolus of normal saline and started on IV fluids and limited MRI of the brain was ordered after discussion with Dr. Corliss Skains  Vitals:   06/02/18 0600 06/02/18 0700 06/02/18 0800 06/02/18 0900  BP: (!) 155/99 (!) 156/82 (!) 145/97 (!) 168/110  Pulse: 65 63 72 76  Resp: Temp:   98.3 F (36.8 C)   TempSrc:   Oral   SpO2: 97% 98% 98% 97%  Weight:      Height:        CBC:  Recent Labs  Lab 05/29/18 0606  06/01/18 0253 06/02/18 0521  WBC 5.9   < > 8.3 7.3  NEUTROABS 2.7  --   --   --   HGB 14.9   < > 15.3 14.7  HCT 43.8   < > 45.1 43.8  MCV 83.0   < > 82.8 83.1  PLT 278   < > 282 283   < > = values in this interval not displayed.    Basic Metabolic Panel:  Recent Labs  Lab 06/01/18 0253 06/02/18 0521  NA 136 136  K 3.7 4.2  CL 97* 97*  CO2 25 25  GLUCOSE 154* 177*  BUN 14 18  CREATININE 1.03 1.04  CALCIUM 9.8 9.6    IMAGING past 24h No results found.    PHYSICAL EXAM : General - Well nourished,  well developed elderly african [place] male, in no apparent distress.  Ophthalmologic - fundi not visualized due to noncooperation.  Cardiovascular - Regular rate and rhythm.   Neurological Exam :  Awake alert oriented to time and place.  Moderate dysarthria but can be understood with some difficulty.  Follows commands well.  Extraocular movements are full range without nystagmus.  Blinks to threat bilaterally.  Moderate right lower facial weakness.  Tongue midline.  Right hemiplegia with right upper extremity 1/5 strength with only finger flexion movements present.  Right lower extremity 2/5 strength.  Normal anti-gravity purposeful movements on the left side.  Tone is diminished on the right compared to the left.  Sensation is diminished on the right hemibody compared to the left.  Gait and Station - deferred.   ASSESSMENT/PLAN Mr. YOBANY VROOM is a 68 y.o. male with history of HTN with possible noncompliance presenting with R sided weakness and slurred speech w/ SBP 240s.  Received IV tPA 05/29/2018 at 0627.  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 ny hmg. Motion degraded.  ASPECTS 10.     CTA head & neck no ELVO. Small basilar, B fetal PCAs w/ severe stenosis mid BA. No flow versus hypoplastic L VA.  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 Corliss Skains pending   Hypertensive Emergency  SBP > 240s on arrival  Home meds:  olmesartan-amlodipine-HCTZ and metoprolol  BP control required prior to starting tPA  Off cleviprex drip   Now Stable  On metoprolol 25 twice daily and amlodpine 10  BP goal130-150 given hypoplastic versus chronic atherosclerotic posterior circulation  Hyperlipidemia  Home meds:  lipitor 40, not compliant  Resume lipitor 40  LDL 119, goal < 70  Continue statin at discharge  Diabetes, new diagnosis  A1c 6.5  DM coordinator on board  Hyperglycemia  SSI  CBG monitoring  Close PCP follow-up and diabetes control  Alcohol abuse  Patient reported 3-4 beers per day  advised to drink no more than 2 drink(s) a day  Will start CIWA protocol  FA/B1/MVI  ETOH on adm < 10  Other Stroke Risk Factors  Advanced age  Other Active Problems  AKI, resolved   Hospital day # 4 I have personally obtained history,examined this patient, reviewed notes, independently viewed imaging studies, participated in medical decision making and plan of care.ROS completed by me personally and pertinent positives fully documented  I have made any additions or clarifications directly to the above note.  He presented with large left brain subcortical infarct due to likely symptomatic left MCA stenosis and was treated with IV TPA with only modest recovery.  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  He has had neurological decline following his diagnostic  cerebral angiogram today.  Recommend keep flat in bed today and IV hydration.  Repeat limited MRI scan of the brain.  Maintain strict blood pressure control as per post TPA protocol.  Discussed with Dr. Corliss Skains  from interventional radiology. This patient is critically ill and at significant risk of neurological worsening, death and care requires constant monitoring of vital signs, hemodynamics,respiratory and cardiac monitoring, extensive review of multiple databases, frequent neurological assessment, discussion with family, other specialists and medical decision making of high complexity.I have made any additions or clarifications directly to the above note.This critical care time does not reflect procedure time, or teaching time or supervisory time of PA/NP/Med Resident etc but could involve care discussion time.  I  Spent 60 minutes of neurocritical care time  in the care of  this patient.  Delia HeadyPramod Ever Halberg, MD Medical Director [facility] Pager: [phone] 06/02/2018 2:30 PM    To contact Stroke Continuity provider, please refer to [email or web address]. After hours, contact General Neurology

## 2018-06-02 NOTE — Consult Note (Signed)
Chief Complaint: Patient was seen in consultation today for CVA  Referring Physician(s): Dr. Roda Shutters  Supervising Physician: Julieanne Cotton  Patient Status: [facility] - In-pt  History of Present Illness: Joe Wood is a 68 y.o. male with past medical history of HTN and questionable compliance to medication at home who presented to [facility] ED with stroke-like symptoms. Patient found to have acute ischemic stroke with multiple intracranial stenoses and intracerebral atherosclerosis. NIR consulted for diagnostic angiogram at the request of Dr. Roda Shutters.  Case reviewed and approved by Dr. Corliss Skains.   Patient presents to [facility] for procedure today.  He is assessed at bedside.  Despite mild-moderate dysarthria, he is able to explain goals of procedure today in his own words.  He continues with right facial droop and right hemiparesis.    Past Medical History:  Diagnosis Date   Hypertension     History reviewed. No pertinent surgical history.  Allergies: Patient has no known allergies.  Medications: Prior to Admission medications   Medication Sig Start Date End Date Taking? Authorizing Provider  aspirin 81 MG tablet Take 81 mg by mouth daily.   Yes [provider]  atorvastatin (LIPITOR) 40 MG tablet Take 10 mg by mouth daily.    Yes [provider]  metoprolol succinate (TOPROL-XL) 100 MG 24 hr tablet Take 100 mg by mouth daily. Take with or immediately following a meal.   Yes [provider]  Olmesartan-amLODIPine-HCTZ (TRIBENZOR) 40-10-12.5 MG TABS Take 1 tablet by mouth daily.    Yes [provider]     Family History  Problem Relation Age of Onset   Cancer Father     Social History   Socioeconomic History   Marital status: Married    Spouse name: Not on file   Number of children: Not on file   Years of education: Not on file   Highest education level: Not on file  Occupational History   Not on file  Social Needs   Financial resource  strain: Not on file   Food insecurity:    Worry: Not on file    Inability: Not on file   Transportation needs:    Medical: Not on file    Non-medical: Not on file  Tobacco Use   Smoking status: Never Smoker  Substance and Sexual Activity   Alcohol use: Yes    Alcohol/week: 0.0 standard drinks   Drug use: No   Sexual activity: Not on file  Lifestyle   Physical activity:    Days per week: Not on file    Minutes per session: Not on file   Stress: Not on file  Relationships   Social connections:    Talks on phone: Not on file    Gets together: Not on file    Attends religious service: Not on file    Active member of club or organization: Not on file    Attends meetings of clubs or organizations: Not on file    Relationship status: Not on file  Other Topics Concern   Not on file  Social History Narrative   Not on file     Review of Systems: A 12 point ROS discussed and pertinent positives are indicated in the HPI above.  All other systems are negative.  Review of Systems  Constitutional: Negative for fatigue and fever.  Respiratory: Negative for cough and shortness of breath.   Gastrointestinal: Negative for abdominal pain, nausea and vomiting.  Neurological: Positive for facial asymmetry and weakness.  Psychiatric/Behavioral: Negative for behavioral problems and confusion.    Vital Signs: BP (!) 168/110    Pulse 76    Temp 98.3 F (36.8 C) (Oral)    Resp 13    Ht 5\' 8"  (1.727 m)    Wt 188 lb 4.4 oz (85.4 kg)    SpO2 97%    BMI 28.63 kg/m   Physical Exam Vitals signs and nursing note reviewed.  Constitutional:      Appearance: Normal appearance.  Cardiovascular:     Rate and Rhythm: Normal rate and regular rhythm.     Heart sounds: No murmur. No friction rub. No gallop.   Pulmonary:     Effort: Pulmonary effort is normal. No respiratory distress.     Breath sounds: Normal breath sounds.  Skin:    General: Skin is warm and dry.  Neurological:      Mental Status: He is alert and oriented to person, place, and time.     Comments: Dysarthria, facial droop, right-sided weakness. Follows commands.  Psychiatric:        Mood and Affect: Mood normal.        Behavior: Behavior normal.        Thought Content: Thought content normal.        Judgment: Judgment normal.          Imaging: Ct Angio Head W Or Wo Contrast  Result Date: 05/29/2018 CLINICAL DATA:  Right-sided weakness EXAM: CT ANGIOGRAPHY HEAD AND NECK TECHNIQUE: Multidetector CT imaging of the head and neck was performed using the standard protocol during bolus administration of intravenous contrast. Multiplanar CT image reconstructions and MIPs were obtained to evaluate the vascular anatomy. Carotid stenosis measurements (when applicable) are obtained utilizing NASCET criteria, using the distal internal carotid diameter as the denominator. CONTRAST:  59mL OMNIPAQUE IOHEXOL 350 MG/ML SOLN COMPARISON:  Head CT earlier today FINDINGS: CTA NECK FINDINGS Aortic arch: Atheromatous plaque.  No acute finding Right carotid system: Moderate calcified plaque at the bifurcation without flow limiting stenosis or ulceration. Left carotid system: Calcified plaque at the bifurcation without flow limiting stenosis or ulceration. Vertebral arteries: No proximal subclavian stenosis. No flow is seen throughout the left vertebral artery. The right vertebral artery is smooth and widely patent to the dura. Skeleton: No acute or aggressive finding Other neck: No acute finding Upper chest: Negative Review of the MIP images confirms the above findings CTA HEAD FINDINGS Anterior circulation: Due to motion requiring repeat imaging there is suboptimal bolus density. Atherosclerotic plaque on the carotid siphons. No proximal flow limiting stenosis or major branch occlusion is seen. Probable medium vessel irregularity from atherosclerosis, but limited by bolus quality. Posterior circulation: No flow is seen within the left  vertebral artery until the intradural segment where faint flow is seen. Small basilar in the setting of bilateral fetal type PCA. There is a mid basilar severe stenosis with apparent flow gap. Advanced narrowing of the right PCA at the P2 segment. Atheromatous irregularity of the left P2/3 segment. Venous sinuses: Patent when accounting for arachnoid granulations. Anatomic variants: As above Delayed phase: Not obtained Review of the MIP images confirms the above findings IMPRESSION: 1. No emergent large vessel occlusion. 2. Small basilar in the setting of bilateral fetal type PCA with severe stenosis of the mid basilar giving an apparent flow gap. No flow is seen within the left vertebral artery until the distal V4 segment. 3. Cervical and intracranial carotid atherosclerosis without flow limiting stenosis. Electronically Signed   By: Marja Kays  Watts M.D.   On: 05/29/2018 06:51   Ct Angio Neck W Or Wo Contrast  Result Date: 05/29/2018 CLINICAL DATA:  Right-sided weakness EXAM: CT ANGIOGRAPHY HEAD AND NECK TECHNIQUE: Multidetector CT imaging of the head and neck was performed using the standard protocol during bolus administration of intravenous contrast. Multiplanar CT image reconstructions and MIPs were obtained to evaluate the vascular anatomy. Carotid stenosis measurements (when applicable) are obtained utilizing NASCET criteria, using the distal internal carotid diameter as the denominator. CONTRAST:  75mL OMNIPAQUE IOHEXOL 350 MG/ML SOLN COMPARISON:  Head CT earlier today FINDINGS: CTA NECK FINDINGS Aortic arch: Atheromatous plaque.  No acute finding Right carotid system: Moderate calcified plaque at the bifurcation without flow limiting stenosis or ulceration. Left carotid system: Calcified plaque at the bifurcation without flow limiting stenosis or ulceration. Vertebral arteries: No proximal subclavian stenosis. No flow is seen throughout the left vertebral artery. The right vertebral artery is smooth and  widely patent to the dura. Skeleton: No acute or aggressive finding Other neck: No acute finding Upper chest: Negative Review of the MIP images confirms the above findings CTA HEAD FINDINGS Anterior circulation: Due to motion requiring repeat imaging there is suboptimal bolus density. Atherosclerotic plaque on the carotid siphons. No proximal flow limiting stenosis or major branch occlusion is seen. Probable medium vessel irregularity from atherosclerosis, but limited by bolus quality. Posterior circulation: No flow is seen within the left vertebral artery until the intradural segment where faint flow is seen. Small basilar in the setting of bilateral fetal type PCA. There is a mid basilar severe stenosis with apparent flow gap. Advanced narrowing of the right PCA at the P2 segment. Atheromatous irregularity of the left P2/3 segment. Venous sinuses: Patent when accounting for arachnoid granulations. Anatomic variants: As above Delayed phase: Not obtained Review of the MIP images confirms the above findings IMPRESSION: 1. No emergent large vessel occlusion. 2. Small basilar in the setting of bilateral fetal type PCA with severe stenosis of the mid basilar giving an apparent flow gap. No flow is seen within the left vertebral artery until the distal V4 segment. 3. Cervical and intracranial carotid atherosclerosis without flow limiting stenosis. Electronically Signed   By: Marnee Spring M.D.   On: 05/29/2018 06:51   [name] Head Wo Contrast  Result Date: 05/30/2018 CLINICAL DATA:  Stroke follow-up EXAM: MRI HEAD WITHOUT CONTRAST MRA HEAD WITHOUT CONTRAST TECHNIQUE: Multiplanar, multiecho pulse sequences of the brain and surrounding structures were obtained without intravenous contrast. Angiographic images of the head were obtained using MRA technique without contrast. COMPARISON:  CTA head neck from yesterday FINDINGS: MRI HEAD FINDINGS Brain: Wedge of restricted diffusion in the left corona radiata. Small acute  infarct at the right putamen. Background of chronic small vessel ischemia with gliosis in the cerebral white matter and lacune at the frontal horn of the left lateral ventricle. Small remote right pontine and right cerebellar infarcts. Remote left thalamic infarct. No acute hemorrhage, hydrocephalus, or masslike finding. Asymmetric prominent CSF in the left parietal region but no clear vessel displacement to imply hygroma. Vascular: Arterial findings below. Skull and upper cervical spine: Negative for marrow lesion Sinuses/Orbits: Negative MRA HEAD FINDINGS Dominant right vertebral artery. Minimal flow present in the left vertebral artery. Small basilar in the setting of fetal type bilateral PCA. There is a superimposed severe basilar stenosis with apparent flow gap. Bilateral PCA atherosclerosis with moderate P2/3 narrowings. Atherosclerotic irregularity of anterior circulation medium size vessels. No branch occlusion or aneurysm. IMPRESSION: 1. Acute  perforator infarct in the left corona radiata. 2. 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 3. 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 4. Intracranial MRA is stable from CTA yesterday. 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 Electronically Signed   By: Marnee SpringJonathon  Watts M.D.   On: 05/30/2018 06:06   Mr Brain Wo Contrast  Result Date: 05/30/2018 CLINICAL DATA:  Stroke follow-up EXAM: MRI HEAD WITHOUT CONTRAST MRA HEAD WITHOUT CONTRAST TECHNIQUE: Multiplanar, multiecho pulse sequences of the brain and surrounding structures were obtained without intravenous contrast. Angiographic images of the head were obtained using MRA technique without contrast. COMPARISON:  CTA head neck from yesterday FINDINGS: MRI HEAD FINDINGS Brain: Wedge of restricted diffusion in the left corona radiata. Small acute infarct at the right putamen. Background of chronic small vessel ischemia with gliosis in the cerebral white matter  and lacune at the frontal horn of the left lateral ventricle. Small remote right pontine and right cerebellar infarcts. Remote left thalamic infarct. No acute hemorrhage, hydrocephalus, or masslike finding. Asymmetric prominent CSF in the left parietal region but no clear vessel displacement to imply hygroma. Vascular: Arterial findings below. Skull and upper cervical spine: Negative for marrow lesion Sinuses/Orbits: Negative MRA HEAD FINDINGS Dominant right vertebral artery. Minimal flow present in the left vertebral artery. Small basilar in the setting of fetal type bilateral PCA. There is a superimposed severe basilar stenosis with apparent flow gap. Bilateral PCA atherosclerosis with moderate P2/3 narrowings. Atherosclerotic irregularity of anterior circulation medium size vessels. No branch occlusion or aneurysm. IMPRESSION: 1. Acute perforator infarct in the left corona radiata. 2. 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 3. 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 4. Intracranial MRA is stable from CTA yesterday. 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 Electronically Signed   By: Marnee SpringJonathon  Watts M.D.   On: 05/30/2018 06:06   Ct Head Code Stroke Wo Contrast  Result Date: 05/29/2018 CLINICAL DATA:  Code stroke. Right-sided deficits. Elevated blood pressure EXAM: CT HEAD WITHOUT CONTRAST TECHNIQUE: Contiguous axial images were obtained from the base of the skull through the vertex without intravenous contrast. COMPARISON:  None. FINDINGS: Brain: Lacunar infarct in the left thalamus, age-indeterminate based on density. Small remote right cerebellar infarct. Chronic small vessel ischemic type change in the cerebral white matter. Symmetric atrophy. No hydrocephalus or masslike finding. Vascular: No hyperdense vessel. Skull: Negative Sinuses/Orbits: Negative Other: Motion degraded. These results were communicated to Dr. Wilford CornerArora at [facility]6:13 amon 5/7/2020by  text page via the [facility] messaging system. ASPECTS (Alberta Stroke Program Early CT Score) - Ganglionic level infarction (caudate, lentiform nuclei, internal capsule, insula, M1-M3 cortex): 7 - Supraganglionic infarction (M4-M6 cortex): 3 Total score (0-10 with 10 being normal): 10 IMPRESSION: 1. Age-indeterminate lacunar infarct in the left thalamus. 2. Negative for acute hemorrhage. 3. Motion degraded. Electronically Signed   By: Marnee SpringJonathon  Watts M.D.   On: 05/29/2018 06:15    Labs:  CBC: Recent Labs    05/30/18 0250 05/31/18 0443 06/01/18 0253 06/02/18 0521  WBC 7.8 7.1 8.3 7.3  HGB 16.3 15.4 15.3 14.7  HCT 47.6 44.9 45.1 43.8  PLT 283 303 282 283    COAGS: Recent Labs    05/29/18 0606  INR 1.0  APTT 32    BMP: Recent Labs    05/30/18 0250 05/31/18 0443 06/01/18 0253 06/02/18 0521  NA 137 135 136 136  K 3.6 3.7 3.7 4.2  CL 97* 102 97* 97*  CO2 GLUCOSE 209* 179* 154* 177*  BUN CALCIUM 9.9 9.7 9.8 9.6  CREATININE 0.90 0.92 1.03 1.04  GFRNONAA >60 >60 >60 >60  GFRAA >60 >60 >60 >60    LIVER FUNCTION TESTS: Recent Labs    05/29/18 0606  BILITOT 0.9  AST 20  ALT 23  ALKPHOS 61  PROT 7.2  ALBUMIN 4.2    TUMOR MARKERS: No results for input(s): AFPTM, CEA, CA199, CHROMGRNA in the last 8760 hours.  Assessment and Plan: Ischemic stroke, intracranial stenosis Patient with history of HTN admitted 5/7 with acute right-sided weakness, facial droop, dysarthria.  NIR consulted for diagnostic cerebral angiogram at the request of the stroke team.  Case reviewed and approved by Dr. Corliss Skains.  Discussed with patient who is able to participate in conversation despite dysarthria as well as patient's wife via telephone to inform of procedure goals as well as obtain consent.  Patient has been NPO.  Plan to proceed with angiogram today.   Risks and benefits of cerebal angriogram were discussed with the patient including, but not limited to  bleeding, infection, vascular injury or contrast induced renal failure.  This interventional procedure involves the use of X-rays and because of the nature of the planned procedure, it is possible that we will have prolonged use of X-ray fluoroscopy.  Potential radiation risks to you include (but are not limited to) the following: - A slightly elevated risk for cancer  several years later in life. This risk is typically less than 0.5% percent. This risk is low in comparison to the normal incidence of human cancer, which is 33% for women and 50% for men according to the [employer] Society. - Radiation induced injury can include skin redness, resembling a rash, tissue breakdown / ulcers and hair loss (which can be temporary or permanent).   The likelihood of either of these occurring depends on the difficulty of the procedure and whether you are sensitive to radiation due to previous procedures, disease, or genetic conditions.   IF your procedure requires a prolonged use of radiation, you will be notified and given written instructions for further action.  It is your responsibility to monitor the irradiated area for the 2 weeks following the procedure and to notify your physician if you are concerned that you have suffered a radiation induced injury.    All of the patient's questions were answered, patient is agreeable to proceed.  Consent signed and in chart.  Thank you for this interesting consult.  I greatly enjoyed meeting Jeremaih E Balz and look forward to participating in their care.  A copy of this report was sent to the requesting provider on this date.  Electronically Signed: Hoyt Koch, PA 06/02/2018, 10:26 AM   I spent a total of 40 Minutes    in face to face in clinical consultation, greater than 50% of which was counseling/coordinating care for CVA, intracranial stenosis.

## 2018-06-02 NOTE — Sedation Documentation (Signed)
Right groin dressing and pulses check with pts nurse. While given pts nurse report noted pt to have significant dysathria. Also continuous movement of left arm and hand, and unable to maintain arm up when instructed to. Pt's nurse placed call to Dr. Pearlean Brownie and orders received.

## 2018-06-02 NOTE — Progress Notes (Signed)
OT Cancellation Note  Patient Details Name: Joe Wood MRN: [id number] DOB: 09/29/1950   Cancelled Treatment:    Reason Eval/Treat Not Completed: Patient at procedure or test/ unavailable.  Pt in IR.  Will check back.  Jeani Hawking, OTR/L Acute Rehabilitation Services Pager [phone] Office [phone]   Jeani Hawking M 06/02/2018, 12:15 PM

## 2018-06-02 NOTE — Progress Notes (Signed)
Physical Therapy Treatment Patient Details Name: Joe DavidsonDarryl E Wood MRN: [id number] DOB: 09/13/1950 Today's Date: 06/02/2018    History of Present Illness Pt is a 68 y.o. M with significant PMH of hypertension with questionable complaince to antihypertensives. Presents with right arm leg weakness, facial weakness and slurred speech. IV tPA was administered. 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    PT Comments    Patient seen for activity progression. Assisted patient with ambulation attempt today, but required siginficant assist and facilitation. Tolerated EOB and OOB to chair with assist. Current POC remains appropriate.   Follow Up Recommendations  CIR     Equipment Recommendations  Other (comment)(tbd)    Recommendations for Other Services Rehab consult     Precautions / Restrictions Precautions Precautions: Fall Restrictions Weight Bearing Restrictions: No    Mobility  Bed Mobility Overal bed mobility: Needs Assistance Bed Mobility: Rolling;Supine to Sit Rolling: Min assist   Supine to sit: Mod assist     General bed mobility comments: VCs for hand placement and positioning, increased time and effort. Use of LUE to pull to sidelying with multi modal cues and increased assist to elevate trunk to upright. Some assist for RLE repositioning.  Transfers Overall transfer level: Needs assistance Equipment used: 2 person hand held assist Transfers: Sit to/from [employer]GI CorporationStand;Stand Pivot Transfers Sit to Stand: Mod assist;+2 physical assistance         General transfer comment: Moderate assist to power to standing with right list and flexed posture, Max facilitation to sustain upright posture in static standing with Quad set RLE.  Ambulation/Gait Ambulation/Gait assistance: Max assist;+2 physical assistance Gait Distance (Feet): 4 Feet Assistive device: 2 person hand held assist Gait Pattern/deviations: Step-to  pattern;Decreased step length - right;Decreased stance time - right;Shuffle;Trunk flexed     General Gait Details: Patient with poor ability to off load and weight shift, manual facilitation for weight shift with VCs for RLE stride, facilitation for quad set during loading response with noted genu recurvatum. Heavy assist required.   Stairs             Wheelchair Mobility    Modified Rankin (Stroke Patients Only)       Balance Overall balance assessment: Needs assistance Sitting-balance support: Feet supported Sitting balance-Leahy Scale: Poor Sitting balance - Comments: Pt requires min A, progressing to close min guard assist  for static sitting  Postural control: Right lateral lean Standing balance support: Single extremity supported Standing balance-Leahy Scale: Poor Standing balance comment: Facilitation for posture required for static standing, moderate assist to maintain                            Cognition Arousal/Alertness: Awake/alert Behavior During Therapy: WFL for tasks assessed/performed Overall Cognitive Status: No family/caregiver present to determine baseline cognitive functioning Area of Impairment: Attention;Safety/judgement;Awareness;Problem solving                   Current Attention Level: Selective     Safety/Judgement: Decreased awareness of safety;Decreased awareness of deficits Awareness: Intellectual;Emergent Problem Solving: Slow processing;Requires verbal cues;Requires tactile cues General Comments: FOllowing commands consistently throughout session with ocassional instance of requiring increased time. Cues for sequencing positioning for mobility tasks and EOB. Easily distracted by environment at times.      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  PT Goal Formulation: With patient Time For Goal Achievement: 06/13/18 Potential to Achieve Goals: Good Progress towards PT goals: Progressing toward goals    Frequency    Min 4X/week      PT Plan Current plan remains appropriate    Co-evaluation              AM-PAC PT "6 Clicks" Mobility   Outcome Measure  Help needed turning from your back to your side while in a flat bed without using bedrails?: A Little Help needed moving from lying on your back to sitting on the side of a flat bed without using bedrails?: A Lot Help needed moving to and from a bed to a chair (including a wheelchair)?: A Lot Help needed standing up from a chair using your arms (e.g., wheelchair or bedside chair)?: A Lot Help needed to walk in hospital room?: A Lot Help needed climbing 3-5 steps with a railing? : Total 6 Click Score: 12    End of Session Equipment Utilized During Treatment: Gait belt Activity Tolerance: Patient tolerated treatment well Patient left: in chair;with call bell/phone within reach;with chair alarm set Nurse Communication: Mobility status PT Visit Diagnosis: Other symptoms and signs involving the nervous system (R29.898);Other abnormalities of gait and mobility (R26.89);Hemiplegia and hemiparesis;Unsteadiness on feet (R26.81) Hemiplegia - Right/Left: Right Hemiplegia - dominant/non-dominant: Dominant Hemiplegia - caused by: Cerebral infarction     Time: 2229-7989 PT Time Calculation (min) (ACUTE ONLY): 21 min  Charges:  $Therapeutic Activity: 8-22 mins                     Charlotte Crumb, PT DPT  Board Certified Neurologic Specialist Acute Rehabilitation Services Pager [phone] Office [phone]    Fabio Asa 06/02/2018, 8:22 AM

## 2018-06-02 NOTE — Progress Notes (Signed)
Pt returned from MRI.  Pt inconsistent with symptoms - sometimes still dysarthric, aphasic and confused.  Updated Dr. Pearlean Brownie and pt's wife, Joe Wood.

## 2018-06-02 NOTE — Progress Notes (Signed)
Pt returned from IR with severe dysarthria, aphasia and constant involuntary left arm movement.  Notified Dr. Pearlean Brownie and Dr. Corliss Skains of this.  Pt's BP also elevated but resolved with 20mg  of labetalol.

## 2018-06-02 NOTE — Sedation Documentation (Signed)
5 fr. Exoseal to right groin 

## 2018-06-03 LAB — GLUCOSE, CAPILLARY
Glucose-Capillary: 146 mg/dL — ABNORMAL HIGH (ref 70–99)
Glucose-Capillary: 158 mg/dL — ABNORMAL HIGH (ref 70–99)
Glucose-Capillary: 232 mg/dL — ABNORMAL HIGH (ref 70–99)
Glucose-Capillary: 131 mg/dL — ABNORMAL HIGH (ref 70–99)
Glucose-Capillary: 120 mg/dL — ABNORMAL HIGH (ref 70–99)
Glucose-Capillary: 145 mg/dL — ABNORMAL HIGH (ref 70–99)

## 2018-06-03 MED ORDER — SODIUM CHLORIDE 0.9% FLUSH: 10.0000 mL | INTRAVENOUS | Status: DC | PRN

## 2018-06-03 MED ORDER — SODIUM CHLORIDE 0.9% FLUSH
10.0000 mL | Freq: Two times a day (BID) | INTRAVENOUS | Status: DC
  Administered 2018-06-03: 20 mL
  Administered 2018-06-03 – 2018-06-05 (×3): 10 mL

## 2018-06-03 MED ORDER — ENOXAPARIN SODIUM 40 MG/0.4ML ~~LOC~~ SOLN
40.0000 mg | SUBCUTANEOUS | Status: DC
  Administered 2018-06-03 – 2018-06-05 (×3): 40 mg via SUBCUTANEOUS
  Filled 2018-06-03 (×3): qty 0.4

## 2018-06-03 NOTE — PMR Pre-admission (Signed)
PMR Admission Coordinator Pre-Admission Assessment  Patient: Joe Wood is an 68 y.o., male MRN: [id number] DOB: Aug 23, 1950 Height: '5\' 8"'$  (172.7 cm) Weight: 85.4 kg  Insurance Information HMO:     PPO: Yes     PCP:      IPA:      80/20:      OTHER:   PRIMARY: [employer] Medicare      Policy#: [id number]      Subscriber: Patient CM Name: Auto approved on [email or web address], confirmed by Gaynelle Arabian     Phone#: [phone]     Fax#: [phone] Pre-Cert#: [id number]      Employer:  Josem Kaufmann for CIR auto-approved on 5/12 for 5/13 admission. Auth confirmed by Gaynelle Arabian. On 5/12. Pt is approved for 3 days (5/13-5/15) with clinical updates due to Dorthula Nettles on day 3 (5/15) to (p): [phone] (f): [phone] Benefits:  Phone #: Online     Name: [email or web address] Eff. Date: 01/22/18 still active     Deduct: $0      Out of Pocket Max: $4,000($0)      Life Max: NA CIR: $160/day for days 1-10, $0/day for days 11+      SNF: $0/day for days 1-20, $50/day for days 21-100; limited to 100 days/cal yr Outpatient: limited by medical necessity     Co-Pay: $20/visit Home Health: 100%, limited by medical necessity      Co-Pay: 0% DME: 80%     Co-Pay: 20% Providers:  SECONDARY: None       Policy#:       Subscriber:  CM Name:       Phone#:      Fax#:  Pre-Cert#:       Employer:  Benefits:  Phone #:      Name:  Eff. Date:      Deduct:       Out of Pocket Max:       Life Max:  CIR:       SNF:  Outpatient:      Co-Pay:  Home Health:      Co-Pay: DME:      Co-Pay:   Medicaid Application Date:       Case Manager: Disability Application Date:       Case Worker:   The "Data Collection Information Summary" for patients in Inpatient Rehabilitation Facilities with attached "Privacy Act Jessie Records" was provided and verbally reviewed with: Patient and Family  Emergency Contact Information Contact Information    Name Relation Home Work Bland   [phone]      Current Medical  History  Patient Admitting Diagnosis: 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 Pt presented 05/29/2018 with right-sided weakness facial droop and slurred speech.  Systolic blood pressure in the 240s.  Placed on Cleviprex drip.  Cranial CT scan showed age indeterminate lacunar infarct left thalamus.  Negative for hemorrhage.  CT angiogram of head and neck no emergent large vessel occlusion.  Patient did receive TPA.  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  Echocardiogram pending.    Cerebral angiogram 06/02/2018 per interventional radiology showed severe preocclusive mid basilar artery stenosis with no plan for intervention.  Neurology follow-up maintained on aspirin and Plavix x3 months  then Plavix alone.  Subcutaneous Lovenox for DVT prophylaxis.  New findings of elevated hemoglobin A1c of 6.5.  Sliding scale insulin with diabetic teaching.  Tolerating a regular diet.  Therapy evaluations completed with pt recommended for CIR. Pt is to be admitted for a comprehensive rehab program on 06/04/2018.   Complete NIHSS TOTAL: 12  Patient's medical record from [facility] has been reviewed by the rehabilitation admission coordinator and physician.  Past Medical History  Past Medical History:  Diagnosis Date  . Hypertension     Family History   family history includes Cancer in his father.  Prior Rehab/Hospitalizations Has the patient had prior rehab or hospitalizations prior to admission? No  Has the patient had major surgery during 100 days prior to admission? No   Current Medications  Current Facility-Administered Medications:  .  0.9 %  sodium chloride infusion, , Intravenous, Continuous, Biby,  Sharon L, NP, Last Rate: 75 mL/hr at 06/05/18 0056 .  acetaminophen (TYLENOL) tablet 650 mg, 650 mg, Oral, Q4H PRN **OR** acetaminophen (TYLENOL) solution 650 mg, 650 mg, Per Tube, Q4H PRN **OR** acetaminophen (TYLENOL) suppository 650 mg, 650 mg, Rectal, Q4H PRN, Biby, Sharon L, NP .  [DISCONTINUED] irbesartan (AVAPRO) tablet 300 mg, 300 mg, Oral, Daily **OR** amLODipine (NORVASC) tablet 10 mg, 10 mg, Oral, Daily, 10 mg at 06/05/18 0948 **OR** [DISCONTINUED] hydrochlorothiazide (MICROZIDE) capsule 12.5 mg, 12.5 mg, Oral, Daily, Rosalin Hawking, MD .  aspirin EC tablet 81 mg, 81 mg, Oral, Daily, Biby, Sharon L, NP, 81 mg at 06/05/18 0948 .  atorvastatin (LIPITOR) tablet 40 mg, 40 mg, Oral, q1800, Biby, Sharon L, NP, 40 mg at 06/04/18 1904 .  clopidogrel (PLAVIX) tablet 75 mg, 75 mg, Oral, Daily, Biby, Sharon L, NP, 75 mg at 06/05/18 0948 .  enoxaparin (LOVENOX) injection 40 mg, 40 mg, Subcutaneous, Q24H, Biby, Sharon L, NP, 40 mg at 06/05/18 0948 .  folic acid (FOLVITE) tablet 1 mg, 1 mg, Oral, Daily, Biby, Sharon L, NP, 1 mg at 06/05/18 0948 .  hydrochlorothiazide (MICROZIDE) capsule 12.5 mg, 12.5 mg, Oral, Daily, Biby, Sharon L, NP, 12.5 mg at 06/05/18 0948 .  insulin aspart (novoLOG) injection 0-9 Units, 0-9 Units, Subcutaneous, Q4H, Biby, Sharon L, NP, 1 Units at 06/05/18 0749 .  labetalol (NORMODYNE) injection 10-20 mg, 10-20 mg, Intravenous, Q10 min PRN, Biby, Sharon L, NP, 10 mg at 06/04/18 1036 .  living well with diabetes book MISC, , Does not apply, Once, Garvin Fila, MD .  metoprolol tartrate (LOPRESSOR) tablet 25 mg, 25 mg, Oral, BID, Biby, Sharon L, NP, 25 mg at 06/05/18 0948 .  multivitamin with minerals tablet 1 tablet, 1 tablet, Oral, Daily, Biby, Sharon L, NP, 1 tablet at 06/05/18 0948 .  pantoprazole (PROTONIX) EC tablet 40 mg, 40 mg, Oral, Daily, Biby, Sharon L, NP, 40 mg at 06/05/18 0948 .  senna-docusate (Senokot-S) tablet 1 tablet, 1 tablet, Oral, QHS PRN, Biby, Sharon L, NP .   sodium chloride flush (NS) 0.9 % injection 10-40 mL, 10-40 mL, Intracatheter, Q12H, Biby, Sharon L, NP, 10 mL at 06/05/18 0949 .  sodium chloride flush (NS) 0.9 % injection 10-40 mL, 10-40 mL, Intracatheter, PRN, Burnetta Sabin L, NP .  thiamine (VITAMIN B-1) tablet 100 mg, 100 mg, Oral, Daily, 100 mg at 06/05/18 0948 **OR** thiamine (B-1) injection 100 mg, 100 mg, Intravenous, Daily, Biby, Massie Kluver, NP  Patients Current Diet:  Diet Order            Diet  regular Room service appropriate? No; Fluid consistency: Thin  Diet effective now              Precautions / Restrictions Precautions Precautions: Fall Restrictions Weight Bearing Restrictions: No   Has the patient had 2 or more falls or a fall with injury in the past year? No  Prior Activity Level Community (5-7x/wk): active and Indepedent PTA, no AD use. 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 Independent  Indoor Mobility: Did the patient need assistance with walking from room to room (with or without device)? Independent  Stairs: Did the patient need assistance with internal or external stairs (with or without device)? Independent  Functional Cognition: Did the patient need help planning regular tasks such as shopping or remembering to take medications? Independent  Home Assistive Devices / Equipment Home Equipment: None  Prior Device Use: Indicate devices/aids used by the patient prior to current illness, exacerbation or injury? None of the above  Current Functional Level Cognition  Arousal/Alertness: Awake/alert Overall Cognitive Status: No family/caregiver present to determine baseline cognitive functioning Current Attention Level: Selective Orientation Level: Oriented to person, Disoriented to place, Disoriented to time, Disoriented to situation Safety/Judgement: Decreased awareness of safety,  Decreased awareness of deficits General Comments: Following commands during session with some increased time, some perseveration noted during task performance. Multi modal cues for sequencing through gait training and task performance. Attention: Focused, Sustained Focused Attention: Appears intact Sustained Attention: Appears intact Memory: Impaired Memory Impairment: Retrieval deficit, Decreased recall of new information(Immediate: 3/3; Delayed 0/3 with cues: 1/3) Awareness: Appears intact Problem Solving: Appears intact(5/5)    Extremity Assessment (includes Sensation/Coordination)  Upper Extremity Assessment: RUE deficits/detail RUE Deficits / Details: pt with decreased awareness to R UE, able to perform gross grasp, FF to approx 45 degrees but limited by strength and coordination  RUE Sensation: decreased proprioception RUE Coordination: decreased fine motor, decreased gross motor  Lower Extremity Assessment: RLE deficits/detail RLE Deficits / Details: 1+/5 quad strength, 0/5 distally RLE Coordination: decreased gross motor    ADLs  Overall ADL's : Needs assistance/impaired Eating/Feeding: Set up, Sitting Grooming: Wash/dry hands, Wash/dry face, Minimal assistance, Sitting Grooming Details (indicate cue type and reason): min assist to wash L hand (hand over hand support), multimodal cueing to wash R side of face Upper Body Bathing: Moderate assistance, Sitting Upper Body Bathing Details (indicate cue type and reason): mod assist (hand over hand) to coordinate and wash L UE using R UE  Lower Body Bathing: Sit to/from stand, Maximal assistance Upper Body Dressing : Moderate assistance, Sitting Lower Body Dressing: Maximal assistance, Sit to/from stand Toilet Transfer: Moderate assistance, +2 for physical assistance, +2 for safety/equipment, Ambulation, BSC Toileting- Clothing Manipulation and Hygiene: Total assistance, Sit to/from stand Functional mobility during ADLs: Moderate  assistance, Maximal assistance, +2 for physical assistance, +2 for safety/equipment General ADL Comments: pt limited by R sided hemiparesis, impaired coordination, cognition and decreased activity tolerance    Mobility  Overal bed mobility: Needs Assistance Bed Mobility: Rolling, Supine to Sit Rolling: Min assist Supine to sit: Mod assist General bed mobility comments: continues to required increased assist to problem solve sequencing and perform physical mobility to EOB. Increased time and effort.    Transfers  Overall transfer level: Needs assistance Equipment used: 2 person hand held assist, Rolling walker (2 wheeled) Transfers: Sit to/from Stand, Stand Pivot Transfers Sit to Stand: Mod assist, +  2 physical assistance(sit to stand X 3 reps with trial of RW) Stand pivot transfers: Mod assist, +2 physical assistance, +2 safety/equipment General transfer comment: Moderate assist to power to standing with facilitation of hand placement for push off and RLE knee block and hip control during power up. +2 physical assist to maintain upright with cues for posture and postioning and needs assistance to keep Rt UE onto RW     Ambulation / Gait / Stairs / Wheelchair Mobility  Ambulation/Gait Ambulation/Gait assistance: Max assist, +2 physical assistance Gait Distance (Feet): 5 Feet Assistive device: 2 person hand held assist, Rolling walker (2 wheeled) Gait Pattern/deviations: Step-to pattern, Decreased step length - right, Decreased stance time - right, Shuffle, Trunk flexed General Gait Details: Trialed RW today, Patient required manual facilitation of weight shift and advancement of RLE. Manual assist for placement of Rt hand on RW and to keep his Rt hand on RW.  2 person physical assist required for RW managment, balance, and safety Gait velocity interpretation: <1.31 ft/sec, indicative of household ambulator    Posture / Balance Dynamic Sitting Balance Sitting balance - Comments: continues to  present with lateral lean and poor midline awareness but able to correct with cues Balance Overall balance assessment: Needs assistance Sitting-balance support: Feet supported Sitting balance-Leahy Scale: Poor Sitting balance - Comments: continues to present with lateral lean and poor midline awareness but able to correct with cues Postural control: Right lateral lean Standing balance support: Bilateral upper extremity supported Standing balance-Leahy Scale: Poor Standing balance comment: continues to required moderate to max assist to maintain upright and facilitate posture and positioning, RLE blocked out     Special needs/care consideration BiPAP/CPAP : no CPM : no Continuous Drip IV: 0.9% sodium chloride infusion  Dialysis : no        Days : no Life Vest : no Oxygen : no Special Bed : no Trach Size : no Wound Vac (area) : no      Location : no Skin: right groin incision from 06/02/18                       Bowel mgmt: last BM: 06/03/18 Bladder mgmt: external catheter, incontinence documented Diabetic mgmt: Yes Behavioral consideration : no Chemo/radiation : no   Previous Home Environment (from acute therapy documentation) Living Arrangements: Spouse/significant other  Lives With: Spouse Available Help at Discharge: Family, Available 24 hours/day Type of Home: House Home Layout: One level Home Access: Stairs to enter Technical brewer of Steps: 2 Bathroom Shower/Tub: Chiropodist: Standard  Discharge Living Setting Plans for Discharge Living Setting: Patient's home, Lives with (comment)(lives with wife; son says there but out of town frequently ) Type of Home at Discharge: House Discharge Home Layout: One level Discharge Home Access: Stairs to enter Entrance Stairs-Rails: None Entrance Stairs-Number of Steps: 2 Discharge Bathroom Shower/Tub: Tub/shower unit Discharge Bathroom Toilet: Standard Discharge Bathroom Accessibility: Yes How Accessible:  Accessible via walker Does the patient have any problems obtaining your medications?: Yes (Describe)(per wife, due to financial reasons)  Social/Family/Support Systems Patient Roles: Spouse Contact Information: wife: Harmon Pier [phone]) Anticipated Caregiver: wife (son when he is home). they have a 2nd son who lives nearby in [place] who can assist/check in when not at work Anticipated Ambulance person Information: see above Ability/Limitations of Caregiver: Min A Caregiver Availability: 24/7 Discharge Plan Discussed with Primary Caregiver: Yes Is Caregiver In Agreement with Plan?: Yes Does Caregiver/Family have Issues with Lodging/Transportation while Pt is  in Rehab?: No  Goals/Additional Needs Patient/Family Goal for Rehab: PT/OT: Min A; SLP: Mod I Expected length of stay: 16-20 days Cultural Considerations: NA Dietary Needs: regular diet, thin liquids Equipment Needs: TBD Pt/Family Agrees to Admission and willing to participate: Yes Program Orientation Provided & Reviewed with Pt/Caregiver Including Roles  & Responsibilities: Yes(pt and wife)  Barriers to Discharge: Home environment access/layout, Lack of/limited family support  Barriers to Discharge Comments: support, will have 24/7 Min A, may be additional assist-family to discuss if able   Decrease burden of Care through IP rehab admission: NA  Possible need for SNF placement upon discharge: Not anticipated; pt has good family support and has good prognosis for further progress through CIR.   Patient Condition: I have reviewed medical records from [facility], spoken with RN, and patient and spouse. I met with patient at the bedside for inpatient rehabilitation assessment.  Patient will benefit from ongoing PT, OT and SLP, can actively participate in 3 hours of therapy a day 5 days of the week, and can make measurable gains during the admission.  Patient will also benefit from the coordinated team approach  during an Inpatient Acute Rehabilitation admission.  The patient will receive intensive therapy as well as Rehabilitation physician, nursing, social worker, and care management interventions.  Due to bladder management, bowel management, safety, skin/wound care, disease management, medication administration, pain management and patient education the patient requires 24 hour a day rehabilitation nursing.  The patient is currently mod +2 with mobility and basic ADLs.  Discharge setting and therapy post discharge at home with home health is anticipated.  Patient has agreed to participate in the Acute Inpatient Rehabilitation Program and will admit today.  Preadmission Screen Completed By:  Jhonnie Garner, with brief updates provided by Shann Medal, PT, DPT on 06/05/2018 10:28 AM ______________________________________________________________________   Discussed status with Dr. Letta Pate on 06/05/18  at 10:29 AM  and received approval for admission today.  Admission Coordinator:  Jhonnie Garner time 10:29 AM Sudie Grumbling 06/05/18    Assessment/Plan: Diagnosis:Left corona radiata infarct 1. Does the need for close, 24 hr/day Medical supervision in concert with the patient's rehab needs make it unreasonable for this patient to be served in a less intensive setting? Yes 2. Co-Morbidities requiring supervision/potential complications: Uncontrolled hypertension 3. Due to bladder management, bowel management, safety, skin/wound care, disease management, medication administration, pain management and patient education, does the patient require 24 hr/day rehab nursing? Yes 4. Does the patient require coordinated care of a physician, rehab nurse, PT (1-2 hrs/day, 5 days/week), OT (1-2 hrs/day, 5 days/week) and SLP (.5-1 hrs/day, 5 days/week) to address physical and functional deficits in the context of the above medical diagnosis(es)? Yes Addressing deficits in the following areas: balance, endurance, locomotion, strength,  transferring, bowel/bladder control, bathing, dressing, feeding, grooming, toileting, cognition and psychosocial support 5. Can the patient actively participate in an intensive therapy program of at least 3 hrs of therapy 5 days a week? Yes 6. The potential for patient to make measurable gains while on inpatient rehab is excellent 7. Anticipated functional outcomes upon discharge from inpatients are: supervision PT, supervision OT, supervision SLP 8. Estimated rehab length of stay to reach the above functional goals is: 14-18d 9. Anticipated D/C setting: Home 10. Anticipated post D/C treatments: [place] therapy 11. Overall Rehab/Functional Prognosis: excellent  MD Signature: Charlett Blake M.D. [facility] FAAPM&R (Sports Med, Neuromuscular Med) Diplomate Am Board of Electrodiagnostic Med

## 2018-06-03 NOTE — Evaluation (Signed)
Clinical/Bedside Swallow Evaluation Patient Details  Name: Joe Wood MRN: [id number] Date of Birth: March 03, 1950  Today's Date: 06/03/2018 Time: SLP Start Time (ACUTE ONLY): 0835 SLP Stop Time (ACUTE ONLY): 0847 SLP Time Calculation (min) (ACUTE ONLY): 12 min  Past Medical History:  Past Medical History:  Diagnosis Date  . Hypertension    Past Surgical History: History reviewed. No pertinent surgical history. HPI:  Pt is a 68 y.o. male past medical history of hypertension with questionable compliance to antihypertensives, who woke up on 05/29/18 and suddently collapsed when attempting to reach his phone. He was not able to move his right arm or leg and also had right facial weakness and slurred speech. IV tPA was administered. CT of the head revealed age-indeterminate lacunar infarct in the left thalamus but was negative for acute hemorrhage. 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   Assessment / Plan / Recommendation Clinical Impression  BSE requested d/t neuro changes on 06/02/18. Pt presents with mild facial weakness that appear improved over previous ST notes. As such, he presents at reduced risk of aspiraiton when following general aspriation precautions while consuming regular diet items with thin liquids via cup ro straw. Pt demonstrated effectively bolus management as evidenced by complete oral clearing and no overt s/s of aspiration when consuming > 3 oz thin liquids. At this time, recommend returning pt to regular diet with thin liquids, medicine whole with thin liquids. Educaiton provided to pt and his nurse on recommendation. No further follow up warranted for dysphagia. ST to continue targeting dysarthria.   SLP Visit Diagnosis: Dysphagia, unspecified (R13.10)    Aspiration Risk  No limitations    Diet Recommendation Regular;Thin liquid   Liquid Administration via: Straw;Cup Medication  Administration: Whole meds with liquid Supervision: Patient able to self feed Compensations: Minimize environmental distractions;Slow rate;Small sips/bites Postural Changes: Seated upright at 90 degrees    Other  Recommendations Oral Care Recommendations: Oral care BID   Follow up Recommendations Inpatient Rehab       Swallow Study   General Date of Onset: 06/02/18 HPI: Pt is a 68 y.o. male past medical history of hypertension with questionable compliance to antihypertensives, who woke up on 05/29/18 and suddently collapsed when attempting to reach his phone. He was not able to move his right arm or leg and also had right facial weakness and slurred speech. IV tPA was administered. CT of the head revealed age-indeterminate lacunar infarct in the left thalamus but was negative for acute hemorrhage. 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 Type of Study: Bedside Swallow Evaluation Previous Swallow Assessment: none in chart Diet Prior to this Study: NPO Temperature Spikes Noted: No Respiratory Status: Room air History of Recent Intubation: No Behavior/Cognition: Alert;Cooperative;Pleasant mood Oral Cavity Assessment: Within Functional Limits Oral Care Completed by SLP: No Oral Cavity - Dentition: Adequate natural dentition Vision: Functional for self-feeding Self-Feeding Abilities: Able to feed self Patient Positioning: Upright in bed Baseline Vocal Quality: Normal Volitional Cough: Strong Volitional Swallow: Able to elicit    Oral/Motor/Sensory Function Overall Oral Motor/Sensory Function: Mild impairment Facial ROM: Reduced right;Suspected CN VII (facial) dysfunction Facial Symmetry: Abnormal symmetry left;Suspected CN VII (facial) dysfunction Facial Strength: Reduced left;Suspected CN VII (facial) dysfunction Facial Sensation: Within Functional Limits Lingual ROM: Reduced left;Suspected CN XII  (hypoglossal) dysfunction Lingual Symmetry: Within Functional Limits Lingual Strength: Reduced;Suspected CN  XII (hypoglossal) dysfunction   Ice Chips Ice chips: Within functional limits Presentation: Spoon   Thin Liquid Thin Liquid: Within functional limits Presentation: Cup;Straw;Self Fed    Nectar Thick Nectar Thick Liquid: Not tested   Honey Thick Honey Thick Liquid: Not tested   Puree Puree: Within functional limits Presentation: Self Fed;Spoon   Solid     Solid: Within functional limits Presentation: Self Fed      Lavene Penagos 06/03/2018,8:59 AM

## 2018-06-03 NOTE — Progress Notes (Signed)
Occupational Therapy Treatment Patient Details Name: Joe DavidsonDarryl E Cianci MRN: [id number] DOB: 12/01/1950 Today's Date: 06/03/2018    History of present illness Pt is a 68 y.o. M with significant PMH of hypertension with questionable complaince to antihypertensives. Presents with right arm leg weakness, facial weakness and slurred speech. IV tPA was administered. 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   OT comments  Patient pleasant and cooperative.  Continues to require mod assist +2 for basic transfers, engaged in self care and R UE neuro reeducation through weightbearing through R UE at EOB and during grooming tasks seated at sink. Required hand over hand support to coordinate R UE while washing L hand/arm, min cueing to attend to R side during functional activities.  Continue to recommend CIR.  Pt highly motivated. Will follow.     Follow Up Recommendations  CIR    Equipment Recommendations  None recommended by OT    Recommendations for Other Services Rehab consult    Precautions / Restrictions Precautions Precautions: Fall Restrictions Weight Bearing Restrictions: No       Mobility Bed Mobility Overal bed mobility: Needs Assistance Bed Mobility: Rolling;Supine to Sit Rolling: Min assist   Supine to sit: Mod assist     General bed mobility comments: continues to required increased assist to problem solve sequencing and perform physical mobility to EOB. Increased time and effort.  Transfers Overall transfer level: Needs assistance Equipment used: 2 person hand held assist Transfers: Sit to/from Stand Sit to Stand: Mod assist;+2 physical assistance         General transfer comment: Moderate assist to power to standing with facilitation of hand placement for push off and RLE knee block and hip control during power up. +2 physical assist to maintain upright with cues for posture and postioning    Balance  Overall balance assessment: Needs assistance Sitting-balance support: Feet supported Sitting balance-Leahy Scale: Poor Sitting balance - Comments: continues to present with lateral lean and poor midline awareness but able to correct with cues, min guard to close supervision statically and min assist dynamically Postural control: Right lateral lean Standing balance support: Bilateral upper extremity supported;During functional activity Standing balance-Leahy Scale: Poor Standing balance comment: continues to required moderate to max assist to maintain upright and facilitate posture and positioning, RLE blocked out                            ADL either performed or assessed with clinical judgement   ADL Overall ADL's : Needs assistance/impaired     Grooming: Wash/dry hands;Wash/dry face;Minimal assistance;Sitting Grooming Details (indicate cue type and reason): min assist to wash L hand (hand over hand support), multimodal cueing to wash R side of face Upper Body Bathing: Moderate assistance;Sitting Upper Body Bathing Details (indicate cue type and reason): mod assist (hand over hand) to coordinate and wash L UE using R UE              Toilet Transfer: Moderate assistance;+2 for physical assistance;+2 for safety/equipment;Ambulation;BSC           Functional mobility during ADLs: Moderate assistance;Maximal assistance;+2 for physical assistance;+2 for safety/equipment General ADL Comments: pt limited by R sided hemiparesis, impaired coordination, cognition and decreased activity tolerance     Vision   Additional Comments: further assesment   Perception     Praxis      Cognition Arousal/Alertness: Awake/alert Behavior During [facility]  for tasks assessed/performed Overall Cognitive Status: No family/caregiver present to determine baseline cognitive functioning Area of Impairment: Attention;Safety/judgement;Awareness;Problem solving                    Current Attention Level: Selective     Safety/Judgement: Decreased awareness of safety;Decreased awareness of deficits Awareness: Intellectual;Emergent Problem Solving: Slow processing;Requires verbal cues;Requires tactile cues;Difficulty sequencing General Comments: pt following commands with increased time, perseveration noted during task perfromance.  multimodal cueing required        Exercises     Shoulder Instructions       General Comments      Pertinent Vitals/ Pain       Pain Assessment: No/denies pain  Home Living                                          Prior Functioning/Environment              Frequency  Min 2X/week        Progress Toward Goals  OT Goals(current goals can now be found in the care plan section)  Progress towards OT goals: Progressing toward goals  Acute Rehab OT Goals Patient Stated Goal: to be able to mow lawns again  OT Goal Formulation: With patient  Plan Discharge plan remains appropriate;Frequency remains appropriate    Co-evaluation    PT/OT/SLP Co-Evaluation/Treatment: Yes Reason for Co-Treatment: For patient/therapist safety;Necessary to address cognition/behavior during functional activity;Complexity of the patient's impairments (multi-system involvement);To address functional/ADL transfers   OT goals addressed during session: ADL's and self-care      AM-PAC OT "6 Clicks" Daily Activity     Outcome Measure   Help from another person eating meals?: A Little Help from another person taking care of personal grooming?: A Little Help from another person toileting, which includes using toliet, bedpan, or urinal?: A Lot Help from another person bathing (including washing, rinsing, drying)?: A Lot Help from another person to put on and taking off regular upper body clothing?: A Lot Help from another person to put on and taking off regular lower body clothing?: Total 6 Click Score: 13    End of  Session Equipment Utilized During Treatment: Gait belt  OT Visit Diagnosis: Hemiplegia and hemiparesis Hemiplegia - Right/Left: Right Hemiplegia - dominant/non-dominant: Dominant Hemiplegia - caused by: Cerebral infarction   Activity Tolerance Patient tolerated treatment well   Patient Left in chair;with call bell/phone within reach;with chair alarm set   Nurse Communication Mobility status        Time: 5638-9373 OT Time Calculation (min): 29 min  Charges: OT General Charges $OT Visit: 1 Visit OT Treatments $Self Care/Home Management : 8-22 mins  Chancy Milroy, OT Acute Rehabilitation Services Pager [phone] Office [phone]    Chancy Milroy 06/03/2018, 12:50 PM

## 2018-06-03 NOTE — Progress Notes (Signed)
STROKE TEAM PROGRESS NOTE   INTERVAL HISTORY Patient neurological status appears to have improved this morning c/w yesterday dysarthria but this was adequately controlled.  No significant new changes overnight. Vitals:   06/03/18 0400 06/03/18 0500 06/03/18 0600 06/03/18 0700  BP: (!) 143/97 (!) 167/99 (!) 161/93 127/87  Pulse: 76 75 71 75  Resp: 10 14 12 16   Temp: 98.6 F (37 C)     TempSrc: Axillary     SpO2: 100% 98% 97% 97%  Weight:      Height:        CBC:  Recent Labs  Lab 05/29/18 0606  06/01/18 0253 06/02/18 0521  WBC 5.9   < > 8.3 7.3  NEUTROABS 2.7  --   --   --   HGB 14.9   < > 15.3 14.7  HCT 43.8   < > 45.1 43.8  MCV 83.0   < > 82.8 83.1  PLT 278   < > 282 283   < > = values in this interval not displayed.    Basic Metabolic Panel:  Recent Labs  Lab 06/01/18 0253 06/02/18 0521  NA 136 136  K 3.7 4.2  CL 97* 97*  CO2 25 25  GLUCOSE 154* 177*  BUN 14 18  CREATININE 1.03 1.04  CALCIUM 9.8 9.6    IMAGING past 24h Ct Head Wo Contrast  Result Date: 06/02/2018 CLINICAL DATA:  New unexplained altered mental status. Recent cerebral angiography. EXAM: CT HEAD WITHOUT CONTRAST TECHNIQUE: Contiguous axial images were obtained from the base of the skull through the vertex without intravenous contrast. COMPARISON:  Catheter angio, MRI and CT studies demonstrating severe basilar stenosis with superimposed hypoplasia. BILATERAL acute cerebral infarcts. FINDINGS: Brain: Hypoattenuation involving the LEFT lenticulostriate territory redemonstrated, appears roughly similar to prior MR. Chronic lacune LEFT thalamus. Smaller lacunar infarct RIGHT basal ganglia also appears stable. No acute hemorrhage, mass lesion, or extra-axial fluid. No hydrocephalus. Hypoattenuation of white matter consistent with small vessel disease. Vascular: Calcification of the cavernous internal carotid arteries consistent with cerebrovascular atherosclerotic disease. No signs of intracranial large  vessel occlusion. Skull: No acute findings. Sinuses/Orbits: No significant sinus or mastoid disease. Negative orbits. Other: None. IMPRESSION: Chronic changes as described. Similar appearance to priors. No acute intracranial findings. Electronically Signed   By: Joe Wood M.D.   On: 06/02/2018 13:06   Mr Brain Wo Contrast  Result Date: 06/02/2018 CLINICAL DATA:  Increased dysarthria, right hemiparesis, and involuntary movements of the left hand, face, and lower extremities following cerebral angiography today which revealed a severe basilar artery stenosis. EXAM: MRI HEAD WITHOUT CONTRAST TECHNIQUE: Multiplanar, multiecho pulse sequences of the brain and surrounding structures were obtained without intravenous contrast. COMPARISON:  Head CT 06/02/2018 and MRI 05/30/2018 FINDINGS: A limited brain MRI was requested by the ordering neurologist. Axial and coronal diffusion, axial SWI, axial T2* gradient echo, axial FLAIR, and axial T2 imaging was performed. The study is variably motion degraded with the SWI sequence being severely degraded. Brain: A 2.5 cm acute left lateral lenticulostriate territory infarct involving the posterior corona radiata and basal ganglia and a punctate acute infarct in the right putamen/external capsule are unchanged from the prior MRI. There are 2 new punctate acute infarcts, 1 in the parietal lobe and 1 in the inferior right cerebellar hemisphere. No intracranial hemorrhage, intracranial mass effect, or extra-axial fluid collection is identified. There is mild cerebral atrophy. Small chronic infarcts are again noted in the left thalamus, left frontal periventricular white matter,  right pons, and right cerebellum. Patchy T2 hyperintensities elsewhere in the cerebral white matter bilaterally are unchanged and compatible with moderate chronic small vessel ischemic disease. Vascular: Major intracranial vascular flow voids are preserved. Skull and upper cervical spine: No suspicious  marrow lesion. Sinuses/Orbits: Unremarkable orbits. Paranasal sinuses and mastoid air cells are clear. Other: None. IMPRESSION: 1. Punctate acute right parietal and right cerebellar infarcts. 2. Unchanged acute left larger than right basal ganglia region infarcts. 3. Moderate chronic small vessel ischemic disease. Electronically Signed   By: Joe AcheAllen  Wood M.D.   On: 06/02/2018 16:46      PHYSICAL EXAM :  General - Well nourished, well developed elderly african [place] male, in no apparent distress.  Ophthalmologic - fundi not visualized due to noncooperation.  Cardiovascular - Regular rate and rhythm.   Neurological Exam :  Awake alert oriented to time and place.  Milde dysarthria    Follows commands well.  Extraocular movements are full range without nystagmus.  Blinks to threat bilaterally.  Moderate right lower facial weakness.  Tongue midline.  Right hemiplegia with right upper extremity 2/5 strength  Right lower extremity 3/5 strength.  Normal anti-gravity purposeful movements on the left side.  Tone is diminished on the right compared to the left.  Sensation is diminished on the right hemibody compared to the left.  Gait and Station - deferred.   ASSESSMENT/PLAN Joe Wood is a 68 y.o. male with history of HTN with possible noncompliance presenting with R sided weakness and slurred speech w/ SBP 240s.  Received IV tPA 05/29/2018 at 0627. Neurological worsening 06/02/2018 following elective cerebral catheter angiogram small cerebral infarcts on repeat MRI Stroke: left BG/CR and right punctate BG infarcts s/p tPA, etiology aspirin and Plavix for 3 months large vessel disease 50% left MCA stenosis.  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 ny hmg. Motion degraded.  ASPECTS 10.     CTA head & neck no ELVO. Small basilar, B fetal PCAs w/ severe stenosis mid  BA. No flow versus hypoplastic L VA.  MRI left BG/CR and right punctate BG infarcts  MRA  severe stenosis of the hypoplastic mid basilar  2D Echo with bubble  pending   LDL 119   HgbA1c 6.5  UDS negative  Lovenox for VTE prophylaxis since Fri, changed to SCDs for planned angio -> change back to Lovenox 40 mg sq daily   aspirin 81 mg daily prior to admission, now on aspirin and Plavix DAPT for 3 months and then Plavix alone.  Therapy recommendations:  Inpt rehab (CIR)   Disposition:  pending   Transfer to the floor  Hypoplastic versus chronic atherosclerotic posterior circulation  CTA head and neck showed hypoplastic versus occlusion of left VA  Also irregular small basilar artery with severe signal drop mid BA  Bilateral fetal PCAs  MRA severe stenosis of the hypoplastic mid basilar, however proximal portion of the basilar artery seems normal caliber  Cerebral angiogram 5/11 - Dr. Corliss Skainseveshwar - 1.Severe pre occlusive midbasilar artery stenosis. 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 3.Approx 50 % stenosis of LT MCA M 1  Post angio increased dysarthria, R HP and minor choreiform movements lips, L hand and L toes which he could voluntarily stop.   Post angio CT 5/11 - no acute changes  Post angio MRI 5/12 -  2 new infarcts punctate R parietal and R cerebellar infarcts  not in Basilar artery territory, unchanged L larger than R basal ganglia region infarcts.  24h post angio pt back to baseline  Hypertensive Emergency  SBP > 240s on arrival  Home meds:  olmesartan-amlodipine-HCTZ and metoprolol  BP control required prior to starting tPA  Off cleviprex drip   Elevated during the night but now Stable  On metoprolol 25 twice daily and amlodpine 10 daily  BP goal130-150 given hypoplastic versus chronic atherosclerotic posterior circulation  Hyperlipidemia  Home meds:  lipitor 40, not compliant  Resume lipitor 40  LDL 119, goal < 70  Continue statin at  discharge  Diabetes type II, new diagnosis  A1c 6.5  DM coordinator on board  Hyperglycemia  SSI 0-9  CBG monitoring  Close PCP follow-up and diabetes control  Alcohol abuse  Patient reported 3-4 beers per day  advised to drink no more than 2 drink(s) a day  On CIWA protocol  FA/B1/MVI  ETOH on adm < 10  Other Stroke Risk Factors  Advanced age  Other Active Problems  AKI, resolved   Hospital day # 5  I have personally obtained history,examined this patient, reviewed notes, independently viewed imaging studies, participated in medical decision making and plan of care.ROS completed by me personally and pertinent positives fully documented  I have made any additions or clarifications directly to the above note. . Patient appears to be neurologically improving.  Recommend mobilize out of bed and therapy and rehab consults.  Transfer to neurology floor bed.  Continue aspirin and Plavix.  Aggressive risk factor medication.  Discussed with patient and bedside nurse and answered questions.  Greater than 50% time during this 35-minute visit spent on counseling and coordination of care for stroke, carotid stenosis and discussion about stroke prevention and answering questions  Delia Heady, MD Medical Director [facility] Pager: [phone] 06/03/2018 12:45 PM   Delia Heady, MD Medical Director [facility] Pager: [phone] 06/03/2018 8:32 AM    To contact Stroke Continuity provider, please refer to [email or web address]. After hours, contact General Neurology

## 2018-06-03 NOTE — Progress Notes (Signed)
Responded to consult for midline check due to not flushing per RN. Dressing changed and tubing tightened. Rapid NS flush x2 with good blood return. RN notified.

## 2018-06-03 NOTE — Progress Notes (Signed)
Inpatient Rehab Admissions:  Inpatient Rehab Consult received.  I met with pt at the bedside for rehabilitation assessment and to discuss goals and expectations of an inpatient rehab admission. Pt is interested in the program. With permission [facility] has contacted pt's spouse via voicemail to discuss program details and assess caregiver support. Await return call for further discussion.   Jhonnie Garner, OTR/L  Rehab Admissions Coordinator  [phone] 06/03/2018 10:53 AM

## 2018-06-03 NOTE — Progress Notes (Signed)
  Speech Language Pathology Treatment: Cognitive-Linquistic  Patient Details Name: Joe Wood MRN: [id number] DOB: December 21, 1950 Today's Date: 06/03/2018 Time: 5638-7564 SLP Time Calculation (min) (ACUTE ONLY): 13 min  Assessment / Plan / Recommendation Clinical Impression  Skilled treatment focused on improving pt's speech intelligibility. SLP received pt asleep in bed but easily aroused. Pt pleasant and cooperative throughout session. SLP facilitated session by providing review of speech intelligibility strategies - specifically over-articulation. Per chart review and nurse report, pt demonstrated fluctuating neuro changes on 06/02/18. MRI revealed on 06/02/18 revealed 2 new punctate acute infarcts, 1 in the parietal lobe and 1 in the inferior right cerebellar hemisphere.  Despite new infarcts pt continues to demonstrate improved speech intelligibility. With Min A cues pt able to progress from 75% intelligibility to 100% speech intelligibility at the phrase level. Pt continues to benefit from skilled ST to target speech intelligibility to increase pt's ability to effectively communicate wants and needs.    HPI HPI: Pt is a 68 y.o. male past medical history of hypertension with questionable compliance to antihypertensives, who woke up on 05/29/18 and suddently collapsed when attempting to reach his phone. He was not able to move his right arm or leg and also had right facial weakness and slurred speech. IV tPA was administered. CT of the head revealed age-indeterminate lacunar infarct in the left thalamus but was negative for acute hemorrhage. 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      SLP Plan  Continue with current plan of care       Recommendations  Diet recommendations: Regular;Thin liquid Liquids provided via: Cup;Straw Medication Administration: Whole meds with liquid Supervision: Patient able to self  feed Compensations: Minimize environmental distractions;Slow rate;Small sips/bites Postural Changes and/or Swallow Maneuvers: Seated upright 90 degrees                General recommendations: Rehab consult Oral Care Recommendations: Oral care BID Follow up Recommendations: Inpatient Rehab SLP Visit Diagnosis: Dysarthria and anarthria (R47.1) Plan: Continue with current plan of care       GO                Joe Wood 06/03/2018, 9:04 AM

## 2018-06-03 NOTE — Progress Notes (Signed)
Physical Therapy Treatment Patient Details Name: Joe Wood MRN: [id number] DOB: [birth year] Today's Date: 06/03/2018    History of Present Illness Pt is a 68 y.o. M with significant PMH of hypertension with questionable complaince to antihypertensives. Presents with right arm leg weakness, facial weakness and slurred speech. IV tPA was administered. 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    PT Comments    Patient seen for activity progression in conjunction with OT. Tolerated session well but continues to require increased physical assist for mobility and functional performance. Patient was receptive to gait retraining activities and functional task performance as well as transfers this session. Current POC remains appropriate with recommendation of CIR upon acute discharge.  Follow Up Recommendations  CIR     Equipment Recommendations  Other (comment)(tbd)    Recommendations for Other Services Rehab consult     Precautions / Restrictions Precautions Precautions: Fall Restrictions Weight Bearing Restrictions: No    Mobility  Bed Mobility Overal bed mobility: Needs Assistance Bed Mobility: Rolling;Supine to Sit Rolling: Min assist   Supine to sit: Mod assist     General bed mobility comments: continues to required increased assist to problem solve sequencing and perform physical mobility to EOB. Increased time and effort.  Transfers Overall transfer level: Needs assistance Equipment used: 2 person hand held assist Transfers: Sit to/from Stand Sit to Stand: Mod assist;+2 physical assistance         General transfer comment: Moderate assist to power to standing with facilitation of hand placement for push off and RLE knee block and hip control during power up. +2 physical assist to maintain upright with cues for posture and postioning  Ambulation/Gait Ambulation/Gait assistance: Max assist;+2  physical assistance Gait Distance (Feet): 12 Feet Assistive device: 2 person hand held assist(3 musketeer gait training technique) Gait Pattern/deviations: Step-to pattern;Decreased step length - right;Decreased stance time - right;Shuffle;Trunk flexed   Gait velocity interpretation: <1.31 ft/sec, indicative of household ambulator General Gait Details: Patient required manual facilitation of weight shift and advancement of RLE. Manual assist for placement with cues for loading response and positioning. 2 person physical assist required   Stairs             Wheelchair Mobility    Modified Rankin (Stroke Patients Only)       Balance Overall balance assessment: Needs assistance Sitting-balance support: Feet supported Sitting balance-Leahy Scale: Poor Sitting balance - Comments: continues to present with lateral lean and poor midline awareness but able to correct with cues Postural control: Right lateral lean Standing balance support: Bilateral upper extremity supported Standing balance-Leahy Scale: Poor Standing balance comment: continues to required moderate to max assist to maintain upright and facilitate posture and positioning, RLE blocked out                             Cognition Arousal/Alertness: Awake/alert Behavior During Therapy: WFL for tasks assessed/performed Overall Cognitive Status: No family/caregiver present to determine baseline cognitive functioning Area of Impairment: Attention;Safety/judgement;Awareness;Problem solving                   Current Attention Level: Selective     Safety/Judgement: Decreased awareness of safety;Decreased awareness of deficits Awareness: Intellectual;Emergent Problem Solving: Slow processing;Requires verbal cues;Requires tactile cues General Comments: Following commands during session with some increased time, some perseveration noted during task performance. Multi modal cues for sequencing through gait  training  and task performance.      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  PT Goal Formulation: With patient Time For Goal Achievement: 06/13/18 Potential to Achieve Goals: Good Progress towards PT goals: Progressing toward goals    Frequency    Min 4X/week      PT Plan Current plan remains appropriate    Co-evaluation PT/OT/SLP Co-Evaluation/Treatment: Yes Reason for Co-Treatment: For patient/therapist safety;Necessary to address cognition/behavior during functional activity;Complexity of the patient's impairments (multi-system involvement);To address functional/ADL transfers          AM-PAC PT "6 Clicks" Mobility   Outcome Measure  Help needed turning from your back to your side while in a flat bed without using bedrails?: A Little Help needed moving from lying on your back to sitting on the side of a flat bed without using bedrails?: A Lot Help needed moving to and from a bed to a chair (including a wheelchair)?: A Lot Help needed standing up from a chair using your arms (e.g., wheelchair or bedside chair)?: A Lot Help needed to walk in hospital room?: A Lot Help needed climbing 3-5 steps with a railing? : Total 6 Click Score: 12    End of Session Equipment Utilized During Treatment: Gait belt Activity Tolerance: Patient tolerated treatment well Patient left: in chair;with call bell/phone within reach;with chair alarm set Nurse Communication: Mobility status PT Visit Diagnosis: Other symptoms and signs involving the nervous system (R29.898);Other abnormalities of gait and mobility (R26.89);Hemiplegia and hemiparesis;Unsteadiness on feet (R26.81) Hemiplegia - Right/Left: Right Hemiplegia -  dominant/non-dominant: Dominant Hemiplegia - caused by: Cerebral infarction     Time: 0951-1020 PT Time Calculation (min) (ACUTE ONLY): 29 min  Charges:  $Therapeutic Activity: 8-22 mins                     Charlotte Crumbevon Shelbee Apgar, PT DPT  Board Certified Neurologic Specialist Acute Rehabilitation Services Pager [phone] Office [phone]    Fabio AsaDevon J Samani Deal 06/03/2018, 10:58 AM

## 2018-06-03 NOTE — Progress Notes (Signed)
Inpatient Rehabilitation-Admissions Coordinator   Pt's wife able to confirm caregiver support. Reviewed benefits information with wife and pt and both are willing to proceed with CIR.   AC has began insurance authorization process for possible admit tomorrow.   Please call if questions.   Nanine Means, OTR/L  Rehab Admissions Coordinator  [phone] 06/03/2018 2:30 PM

## 2018-06-04 DIAGNOSIS — I651 Occlusion and stenosis of basilar artery: Secondary | ICD-10-CM | POA: Diagnosis present

## 2018-06-04 DIAGNOSIS — F1010 Alcohol abuse, uncomplicated: Secondary | ICD-10-CM | POA: Diagnosis present

## 2018-06-04 DIAGNOSIS — I161 Hypertensive emergency: Secondary | ICD-10-CM | POA: Diagnosis present

## 2018-06-04 DIAGNOSIS — E785 Hyperlipidemia, unspecified: Secondary | ICD-10-CM | POA: Diagnosis present

## 2018-06-04 DIAGNOSIS — E119 Type 2 diabetes mellitus without complications: Secondary | ICD-10-CM

## 2018-06-04 LAB — GLUCOSE, CAPILLARY
Glucose-Capillary: 126 mg/dL — ABNORMAL HIGH (ref 70–99)
Glucose-Capillary: 135 mg/dL — ABNORMAL HIGH (ref 70–99)
Glucose-Capillary: 149 mg/dL — ABNORMAL HIGH (ref 70–99)
Glucose-Capillary: 162 mg/dL — ABNORMAL HIGH (ref 70–99)
Glucose-Capillary: 168 mg/dL — ABNORMAL HIGH (ref 70–99)
Glucose-Capillary: 246 mg/dL — ABNORMAL HIGH (ref 70–99)

## 2018-06-04 LAB — ECHOCARDIOGRAM LIMITED BUBBLE STUDY
Height: 68 in
Weight: 3012.37 [oz_av]

## 2018-06-04 MED ORDER — ADULT MULTIVITAMIN W/MINERALS CH: 1.0000 | ORAL_TABLET | Freq: Every day | ORAL | Status: DC

## 2018-06-04 MED ORDER — ENOXAPARIN SODIUM 40 MG/0.4ML ~~LOC~~ SOLN: 40.0000 mg | SUBCUTANEOUS | Status: DC

## 2018-06-04 MED ORDER — ASPIRIN 81 MG PO TBEC: 81.0000 mg | DELAYED_RELEASE_TABLET | Freq: Every day | ORAL | Status: DC

## 2018-06-04 MED ORDER — FOLIC ACID 1 MG PO TABS: 1.0000 mg | ORAL_TABLET | Freq: Every day | ORAL | Status: DC

## 2018-06-04 MED ORDER — INSULIN ASPART 100 UNIT/ML ~~LOC~~ SOLN: 0.0000 [IU] | SUBCUTANEOUS | 11 refills | Status: DC

## 2018-06-04 MED ORDER — CLOPIDOGREL BISULFATE 75 MG PO TABS: 75.0000 mg | ORAL_TABLET | Freq: Every day | ORAL | Status: DC

## 2018-06-04 MED ORDER — HYDROCHLOROTHIAZIDE 12.5 MG PO CAPS: 12.5000 mg | ORAL_CAPSULE | Freq: Every day | ORAL | Status: DC

## 2018-06-04 MED ORDER — THIAMINE HCL 100 MG PO TABS: 100.0000 mg | ORAL_TABLET | Freq: Every day | ORAL | Status: DC

## 2018-06-04 MED ORDER — AMLODIPINE BESYLATE 10 MG PO TABS: 10.0000 mg | ORAL_TABLET | Freq: Every day | ORAL | Status: DC

## 2018-06-04 MED ORDER — HYDROCHLOROTHIAZIDE 12.5 MG PO CAPS
12.5000 mg | ORAL_CAPSULE | Freq: Every day | ORAL | Status: DC
  Administered 2018-06-04 – 2018-06-05 (×2): 12.5 mg via ORAL
  Filled 2018-06-04 (×4): qty 1

## 2018-06-04 MED ORDER — METOPROLOL TARTRATE 25 MG PO TABS: 25.0000 mg | ORAL_TABLET | Freq: Two times a day (BID) | ORAL | Status: DC

## 2018-06-04 MED ORDER — ATORVASTATIN CALCIUM 40 MG PO TABS: 40.0000 mg | ORAL_TABLET | Freq: Every day | ORAL | Status: DC

## 2018-06-04 MED ORDER — LABETALOL HCL 5 MG/ML IV SOLN: 10.0000 mg | INTRAVENOUS | Status: DC | PRN

## 2018-06-04 MED ORDER — LORAZEPAM 2 MG/ML IJ SOLN
1.0000 mg | Freq: Once | INTRAMUSCULAR | Status: AC
  Administered 2018-06-04: 1 mg via INTRAVENOUS
  Filled 2018-06-04: qty 1

## 2018-06-04 NOTE — Progress Notes (Signed)
Inpatient Rehabilitation-Admissions Coordinator   Due to recent elevated BP and need for IV Labetalol, admitting PM&R MD would like to hold admission to CIR for today. An AC will follow up tomorrow for possible admission if appropriate.  Nanine Means, OTR/L  Rehab Admissions Coordinator  [phone] 06/04/2018 1:14 PM

## 2018-06-04 NOTE — Progress Notes (Signed)
STROKE TEAM PROGRESS NOTE   INTERVAL HISTORY BP trending up since yesterday. Very high this am. RN just gave prn but has not yet given am meds. Pt medically ready for transfer though rehab concerned about BP. Will add HCTZ. Rehab MD to see later this afternoon.    Vitals:   06/03/18 2001 06/04/18 0008 06/04/18 0346 06/04/18 0930  BP: (!) 163/94 (!) 162/90 (!) 174/99 (!) 212/119  Pulse: 72 66 78 (!) 102  Resp: 18 16 18 18   Temp: 98.9 F (37.2 C) 98.7 F (37.1 C) 98.6 F (37 C) 97.6 F (36.4 C)  TempSrc: Oral Oral Oral Oral  SpO2: 99% 99% 100% 99%  Weight:      Height:        CBC:  Recent Labs  Lab 05/29/18 0606  06/01/18 0253 06/02/18 0521  WBC 5.9   < > 8.3 7.3  NEUTROABS 2.7  --   --   --   HGB 14.9   < > 15.3 14.7  HCT 43.8   < > 45.1 43.8  MCV 83.0   < > 82.8 83.1  PLT 278   < > 282 283   < > = values in this interval not displayed.    Basic Metabolic Panel:  Recent Labs  Lab 06/01/18 0253 06/02/18 0521  NA 136 136  K 3.7 4.2  CL 97* 97*  CO2 25 25  GLUCOSE 154* 177*  BUN 14 18  CREATININE 1.03 1.04  CALCIUM 9.8 9.6    IMAGING past 24h No results found.    PHYSICAL EXAM   General - Well nourished, well developed elderly african [place] male, in no apparent distress.  Ophthalmologic - fundi not visualized due to noncooperation.  Cardiovascular - Regular rate and rhythm.   Neurological Exam :  Awake alert oriented to time and place.  Mild  dysarthria    Follows commands well.  Extraocular movements are full range without nystagmus.  Blinks to threat bilaterally.  Moderate right lower facial weakness.  Tongue midline.  Right hemiplegia with right upper extremity 2/5 strength  Right lower extremity 3/5 strength.  Normal anti-gravity purposeful movements on the left side.  Tone is diminished on the right compared to the left.  Sensation is diminished on the right hemibody compared to the left.  Gait and Station - deferred.   ASSESSMENT/PLAN [name] is a 68 y.o. male with history of HTN with possible noncompliance presenting with R sided weakness and slurred speech w/ SBP 240s. Received IV tPA 05/29/2018 at 0627. Neurological worsening 06/02/2018 following elective cerebral catheter angiogram with small cerebral infarcts on repeat MRI, not related to midbasilar stenosis seen during catheter angio.  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 ny hmg. Motion degraded.  ASPECTS 10.     CTA head & neck no ELVO. Small basilar, B fetal PCAs w/ severe stenosis mid BA. No flow versus hypoplastic L VA.  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 No source of embolus   LDL 119   HgbA1c 6.5  UDS negative  Lovenox 40 mg sq daily for VTE prophylaxis  aspirin 81 mg daily prior to admission, now on aspirin and Plavix.  DAPT for 3 months and then Plavix alone.  Therapy recommendations:  Inpt rehab (CIR)   Disposition:  pending   Rehab considered taking today but concerned about elevated BP. They will relook at tomorrow.  Hypoplastic versus chronic atherosclerotic posterior circulation  CTA head and neck showed hypoplastic versus occlusion of left VA  Also irregular small basilar artery with severe signal drop mid BA  Bilateral fetal PCAs  MRA severe stenosis of the hypoplastic mid basilar, however proximal portion of the basilar artery seems normal caliber  Cerebral angiogram 5/11 - Dr. Corliss Skainseveshwar - 1.Severe pre occlusive midbasilar artery stenosis. 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 3.Approx 50 % stenosis of LT MCA M 1  Post angio increased dysarthria, R HP and minor  choreiform movements lips, L hand and L toes which he could voluntarily stop.   Post angio CT 5/11 - no acute changes  Post angio MRI 5/12 -  2 new infarcts punctate R parietal and R cerebellar infarcts not in Basilar artery territory, unchanged L larger than R basal ganglia region infarcts.  24h post angio pt back to baseline  Hypertensive Emergency  SBP > 240s on arrival  Home meds:  olmesartan 40 - amlodipine 10 - HCTZ 12.5 (tribenzor) and metoprolol 100  BP control required prior to starting tPA  Off cleviprex drip   Again elevated during last night, and extremely elevated this am. Has been getting prn labetolol  On metoprolol 25 twice daily and amlodpine 10 daily  Add home HCTZ dose  Check labs in am  BP goal130-150 given hypoplastic versus chronic atherosclerotic posterior circulation  Hyperlipidemia  Home meds:  lipitor 40, not compliant  Resume lipitor 40  LDL 119, goal < 70  Continue statin at discharge  Diabetes type II, new diagnosis  A1c 6.5  DM coordinator on board  Hyperglycemia  SSI 0-9  CBG monitoring  Close PCP follow-up and diabetes control  Alcohol abuse  Patient reported 3-4 beers per day  advised to drink no more than 2 drink(s) a day  On CIWA protocol  FA/B1/MVI  ETOH on adm < 10  Other Stroke Risk Factors  Advanced age  Other Active Problems  AKI, resolved   Hospital day # 6  Patient appears medically stable to be discharged to rehab when bed available.  Blood pressure slightly high will add hydrochlorothiazide.  No neurological changes.  Delia HeadyPramod , MD   To contact Stroke Continuity provider, please refer to [email or web address]. After hours, contact General Neurology

## 2018-06-04 NOTE — Care Management Important Message (Signed)
Important Message  Patient Details  Name: Joe Wood MRN: [id number] Date of Birth: [birth year]   Medicare Important Message Given:  Yes    Fallan Mccarey Stefan Church 06/04/2018, 2:34 PM

## 2018-06-04 NOTE — Progress Notes (Signed)
Physical Therapy Treatment Patient Details Name: Joe DavidsonDarryl E Wood MRN: [id number] DOB: 01/28/1950 Today's Date: 06/04/2018    History of Present Illness Pt is a 68 y.o. M with significant PMH of hypertension with questionable complaince to antihypertensives. Presents with right arm leg weakness, facial weakness and slurred speech. IV tPA was administered. 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    PT Comments    Pt was trialed with RW today for sit to stands and ambulation. Due to his Rt hemiparesis and neglect this was difficult for him and he still needed +2 assistance for RW management, balance, safety, turning, and holding is Rt hand onto walke  to complete transfer bed to chair and 5 ft of ambulation. He does show good effort with PT. Current plan seems appropriate and he will continue to benefit from PT   Follow Up Recommendations  CIR     Equipment Recommendations  Other (comment)(tbd)    Recommendations for Other Services Rehab consult     Precautions / Restrictions Precautions Precautions: Fall Restrictions Weight Bearing Restrictions: No    Mobility  Bed Mobility Overal bed mobility: Needs Assistance Bed Mobility: Rolling;Supine to Sit Rolling: Min assist   Supine to sit: Mod assist     General bed mobility comments: continues to required increased assist to problem solve sequencing and perform physical mobility to EOB. Increased time and effort.  Transfers Overall transfer level: Needs assistance Equipment used: 2 person hand held assist;Rolling walker (2 wheeled) Transfers: Sit to/from UGI CorporationStand;Stand Pivot Transfers Sit to Stand: Mod assist;+2 physical assistance(sit to stand X 3 reps with trial of RW)         General transfer comment: Moderate assist to power to standing with facilitation of hand placement for push off and RLE knee block and hip control during power up. +2 physical assist to  maintain upright with cues for posture and postioning and needs assistance to keep Rt UE onto RW   Ambulation/Gait Ambulation/Gait assistance: Max assist;+2 physical assistance Gait Distance (Feet): 5 Feet Assistive device: 2 person hand held assist;Rolling walker (2 wheeled) Gait Pattern/deviations: Step-to pattern;Decreased step length - right;Decreased stance time - right;Shuffle;Trunk flexed   Gait velocity interpretation: <1.31 ft/sec, indicative of household ambulator General Gait Details: Trialed RW today, Patient required manual facilitation of weight shift and advancement of RLE. Manual assist for placement of Rt hand on RW and to keep his Rt hand on RW.  2 person physical assist required for RW managment, balance, and safety   Stairs             Wheelchair Mobility    Modified Rankin (Stroke Patients Only)       Balance Overall balance assessment: Needs assistance Sitting-balance support: Feet supported Sitting balance-Leahy Scale: Poor Sitting balance - Comments: continues to present with lateral lean and poor midline awareness but able to correct with cues Postural control: Right lateral lean Standing balance support: Bilateral upper extremity supported Standing balance-Leahy Scale: Poor Standing balance comment: continues to required moderate to max assist to maintain upright and facilitate posture and positioning, RLE blocked out                             Cognition Arousal/Alertness: Awake/alert Behavior During Therapy: WFL for tasks assessed/performed Overall Cognitive Status: No family/caregiver present to determine baseline cognitive functioning Area of Impairment: Attention;Safety/judgement;Awareness;Problem solving  Current Attention Level: Selective     Safety/Judgement: Decreased awareness of safety;Decreased awareness of deficits Awareness: Intellectual;Emergent Problem Solving: Slow processing;Requires  verbal cues;Requires tactile cues General Comments: Following commands during session with some increased time, some perseveration noted during task performance. Multi modal cues for sequencing through gait training and task performance.      Exercises  Attempted to raise Rt UE, he has slight movement of his hand and forearm but this is very minimal, he was encouraged to continue to try to make a fist and raise his Rt UE.      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  PT Goal Formulation: With patient Time For Goal Achievement: 06/13/18 Potential to Achieve Goals: Good Progress towards PT goals: Progressing toward goals    Frequency    Min 4X/week      PT Plan Current plan remains appropriate       AM-PAC PT "6 Clicks" Mobility   Outcome Measure  Help needed turning from your back to your side while in a flat bed without using bedrails?: A Little Help needed moving from lying on your back to sitting on the side of a flat bed without using bedrails?: A Lot Help needed moving to and from a bed to a chair (including a wheelchair)?: A Lot Help needed standing up from a chair using your arms (e.g., wheelchair or bedside chair)?: A Lot Help needed to walk in hospital room?: A Lot Help needed climbing 3-5 steps with a railing? : Total 6 Click Score: 12    End of Session Equipment Utilized During Treatment: Gait belt Activity Tolerance: Patient tolerated treatment well Patient left: in chair;with call bell/phone within reach;with chair alarm set Nurse Communication: Mobility status PT Visit Diagnosis: Other symptoms and signs involving the nervous system (R29.898);Other abnormalities of gait and mobility (R26.89);Hemiplegia and hemiparesis;Unsteadiness on feet (R26.81) Hemiplegia - Right/Left: Right Hemiplegia - dominant/non-dominant: Dominant Hemiplegia - caused by: Cerebral  infarction     Time: 1355-1418 PT Time Calculation (min) (ACUTE ONLY): 23 min  Charges:  $Therapeutic Activity: 23-37 mins                     Ivery Quale, PT,DPT Acute Rehabilitation Services Office [phone]    April Manson 06/04/2018, 3:07 PM

## 2018-06-04 NOTE — Discharge Summary (Addendum)
Stroke Discharge Summary  Patient ID: Joe Wood    l   MRN: [id number]      DOB: 11/01/1950  Date of Admission: 05/29/2018 Date of Discharge: 06/05/2018  Attending Physician:  [name], [name], MD, Stroke MD Consultant(s):   [name], MD (Interventional Neuroradiologist)  Patient's PCP:  Patient, No Pcp Per  Discharge Diagnoses:  Principal Problem:   Acute ischemic stroke (HCC) - s/p tPA, d/t left MCA stenosis   Active Problems:   Severe Basilar artery stenosis, asymptomatic   Hypertensive emergency   Hyperlipidemia LDL goal <70   Diabetes mellitus, type II (HCC)   Alcohol abuse  Past Medical History:  Diagnosis Date  . Hypertension    Past Surgical History:  Procedure Laterality Date  . IR ANGIO INTRA EXTRACRAN SEL COM CAROTID INNOMINATE BILAT MOD SED  06/02/2018  . IR ANGIO VERTEBRAL SEL SUBCLAVIAN INNOMINATE UNI R MOD SED  06/02/2018  . IR ANGIOGRAM EXTREMITY LEFT  06/02/2018    Medications to be continued on Rehab Allergies as of 06/05/2018   No Known Allergies     Medication List    STOP taking these medications   aspirin 81 MG tablet Replaced by:  aspirin 81 MG EC tablet   metoprolol succinate 100 MG 24 hr tablet Commonly known as:  TOPROL-XL     TAKE these medications   aspirin 81 MG EC tablet Take 1 tablet (81 mg total) by mouth daily for 21 days. Replaces:  aspirin 81 MG tablet   atorvastatin 40 MG tablet Commonly known as:  LIPITOR Take 1 tablet (40 mg total) by mouth daily at 6 PM. What changed:    how much to take  when to take this   clopidogrel 75 MG tablet Commonly known as:  PLAVIX Take 1 tablet (75 mg total) by mouth daily.   enoxaparin 40 MG/0.4ML injection Commonly known as:  LOVENOX Inject 0.4 mLs (40 mg total) into the skin daily.   folic acid 1 MG tablet Commonly known as:  FOLVITE Take 1 tablet (1 mg total) by mouth daily.   insulin aspart 100 UNIT/ML injection Commonly known as:  novoLOG Inject 0-9 Units  into the skin every 4 (four) hours.   labetalol 5 MG/ML injection Commonly known as:  NORMODYNE Inject 2-4 mLs (10-20 mg total) into the vein every 10 (ten) minutes as needed (BP > 180/105).   metoprolol tartrate 25 MG tablet Commonly known as:  LOPRESSOR Take 1 tablet (25 mg total) by mouth 2 (two) times daily.   multivitamin with minerals Tabs tablet Take 1 tablet by mouth daily.   thiamine 100 MG tablet Take 1 tablet (100 mg total) by mouth daily.   Tribenzor 40-10-12.5 MG Tabs Generic drug:  Olmesartan-amLODIPine-HCTZ Take 1 tablet by mouth daily.       LABORATORY STUDIES CBC    Component Value Date/Time   WBC 6.5 06/05/2018 0500   RBC 5.05 06/05/2018 0500   HGB 14.1 06/05/2018 0500   HCT 40.7 06/05/2018 0500   PLT 319 06/05/2018 0500   MCV 80.6 06/05/2018 0500   MCH 27.9 06/05/2018 0500   MCHC 34.6 06/05/2018 0500   RDW 12.5 06/05/2018 0500   LYMPHSABS 2.4 05/29/2018 0606   MONOABS 0.6 05/29/2018 0606   EOSABS 0.2 05/29/2018 0606   BASOSABS 0.0 05/29/2018 0606   CMP    Component Value Date/Time   NA 136 06/05/2018 0500   K 3.5 06/05/2018 0500   CL 100 06/05/2018 0500  CO2 23 06/05/2018 0500   GLUCOSE 162 (H) 06/05/2018 0500   BUN 13 06/05/2018 0500   CREATININE 1.00 06/05/2018 0500   CALCIUM 9.8 06/05/2018 0500   PROT 7.2 05/29/2018 0606   ALBUMIN 4.2 05/29/2018 0606   AST 20 05/29/2018 0606   ALT 23 05/29/2018 0606   ALKPHOS 61 05/29/2018 0606   BILITOT 0.9 05/29/2018 0606   GFRNONAA >60 06/05/2018 0500   GFRAA >60 06/05/2018 0500   COAGS Lab Results  Component Value Date   INR 1.0 05/29/2018   Lipid Panel    Component Value Date/Time   CHOL 233 (H) 05/30/2018 0250   TRIG 259 (H) 05/30/2018 0250   TRIG 251 (H) 05/30/2018 0250   HDL 62 05/30/2018 0250   CHOLHDL 3.8 05/30/2018 0250   VLDL 52 (H) 05/30/2018 0250   LDLCALC 119 (H) 05/30/2018 0250   HgbA1C  Lab Results  Component Value Date   HGBA1C 6.5 (H) 05/30/2018   Urinalysis     Component Value Date/Time   COLORURINE STRAW (A) 05/29/2018 1650   APPEARANCEUR CLEAR 05/29/2018 1650   LABSPEC 1.015 05/29/2018 1650   PHURINE 5.0 05/29/2018 1650   GLUCOSEU 50 (A) 05/29/2018 1650   HGBUR MODERATE (A) 05/29/2018 1650   BILIRUBINUR NEGATIVE 05/29/2018 1650   KETONESUR NEGATIVE 05/29/2018 1650   PROTEINUR 100 (A) 05/29/2018 1650   NITRITE NEGATIVE 05/29/2018 1650   LEUKOCYTESUR NEGATIVE 05/29/2018 1650   Urine Drug Screen     Component Value Date/Time   LABOPIA NONE DETECTED 05/29/2018 1650   COCAINSCRNUR NONE DETECTED 05/29/2018 1650   LABBENZ NONE DETECTED 05/29/2018 1650   AMPHETMU NONE DETECTED 05/29/2018 1650   THCU NONE DETECTED 05/29/2018 1650   LABBARB NONE DETECTED 05/29/2018 1650    Alcohol Level    Component Value Date/Time   ETH <10 05/29/2018 0606     SIGNIFICANT DIAGNOSTIC STUDIES Ct Head Code Stroke Wo Contrast 05/29/2018 1. Age-indeterminate lacunar infarct in the left thalamus.  2. Negative for acute hemorrhage.  3. Motion degraded.     Ct Angio Head W Or Wo Contrast Ct Angio Neck W Or Wo Contrast 05/29/2018 1. No emergent large vessel occlusion.  2. Small basilar in the setting of bilateral fetal type PCA with severe stenosis of the mid basilar giving an apparent flow gap. No flow is seen within the left vertebral artery until the distal V4 segment.  3. Cervical and intracranial carotid atherosclerosis without flow limiting stenosis.   [name] Head Wo Contrast 05/30/2018 1. Acute perforator infarct in the left corona radiata.  2. 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  3. 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  4. Intracranial MRA is stable from CTA yesterday. 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   Cerebral Angio 06/02/2018 1.Severe pre occlusive midbasilar artery stenosis. 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 3.Approx 50 % stenosis of LT MCA M 1  Ct Head Wo  Contrast 06/02/2018 Chronic changes as described. Similar appearance to priors. No acute intracranial findings.   Mr Brain Wo Contrast 06/02/2018 1. Punctate acute right parietal and right cerebellar infarcts. 2. Unchanged acute left larger than right basal ganglia region infarcts. 3. Moderate chronic small vessel ischemic disease.      HISTORY OF PRESENT ILLNESS Joe Wood is a 68 y.o. male past medical history of hypertension with questionable compliance to antihypertensives, woke up this morning 05/29/2018 around 5 AM and went to the bathroom.  Came back  to his bed and on attempting to reach his phone suddenly collapsed and when family checked on him he was not able to move the right side-right arm and leg.  He also had right facial weakness and slurred speech.  EMS was called.  They evaluated him and brought him in as an acute code stroke. He was evaluated in the emergency room and exhibited right-sided hemiplegia, right facial droop and dysarthria with no cortical signs suggestive of subcortical strokes. His blood pressures on arrival were systolic 240s. CT head was negative for bleed and IV TPA was started after bringing blood pressure under the parameters. It took a dose of labetalol 20 mg and Cleviprex drip to get his pressures under control which delayed IV TPA. Denied any chest pain shortness of breath nausea vomiting or sickness or illness preceding the symptoms. Premorbid modified Rankin scale (mRS): 0   HOSPITAL COURSE [name] is a 68 y.o. male with history of HTN with possible noncompliance presenting with R sided weakness and slurred speech w/ SBP 240s. Received IV tPA 05/29/2018 at 0627. Neurological worsening 06/02/2018 following elective cerebral catheter angiogram during hospitalization with small cerebral infarcts on repeat MRI, not related to midbasilar stenosis seen during catheter angio. Returned to baseline.  Stroke: left BG/CR and right punctate BG infarcts s/p  tPA, etiology large vessel disease 50% left MCA stenosis.    Code Stroke CT head age indeterminate L thalamic infarct. ny hmg. Motion degraded.  ASPECTS 10.     CTA head & neck no ELVO. Small basilar, B fetal PCAs w/ severe stenosis mid BA. No flow versus hypoplastic L VA.  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 No source of embolus   LDL 119   HgbA1c 6.5  UDS negative  aspirin 81 mg daily prior to admission, now on aspirin and Plavix.  DAPT for 3 months and then Plavix alone.  Therapy recommendations:  Inpt rehab (CIR)   Disposition:  CIR - d/c delayed yesterday d/t high BP, which is better controlled.  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 Corliss Skains - 1.Severe pre occlusive midbasilar artery stenosis. 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 3.Approx 50 % stenosis of LT MCA M 1  Post elective angi o increased dysarthria, R HP and minor choreiform movements lips, L hand and L toes which he could voluntarily stop.   Post angio CT 5/11 - no acute changes  Post angio MRI 5/12 -  2 new infarcts punctate R parietal and R cerebellar infarcts not in Basilar artery territory, unchanged L larger than R basal ganglia region infarcts.  24h post angio pt back to baseline  Hypertensive Emergency  SBP > 240s on arrival  Home meds:  olmesartan 40 - amlodipine 10 - HCTZ 12.5 (tribenzor) and metoprolol 100  BP control required prior to starting tPA  Off cleviprex drip   On metoprolol 25 twice daily, amlodpine 10 daily, HCTZ  12.5.  BP remains elevated but much more stable than yesterday  BP goal130-150 given hypoplastic versus chronic atherosclerotic  posterior circulation  labs stable  Resume olmesartan today  Hyperlipidemia  Home meds:  lipitor 40, not compliant  Resume lipitor 40  LDL 119, goal < 70  Continue statin at discharge  Diabetes type II, new diagnosis  A1c 6.5  DM coordinator on board  Hyperglycemia  SSI 0-9  CBG monitoring  Close PCP follow-up and diabetes control following discharge from rehab  Alcohol abuse  Patient reported 3-4 beers per day  advised to drink no more than 2 drink(s) a day  On CIWA protocol  FA/B1/MVI  ETOH on adm < 10  No significant withdrawal issues  Other Stroke Risk Factors  Advanced age  Other Active Problems  AKI, resolved    DISCHARGE EXAM Blood pressure (!) 145/95, pulse 75, temperature 99.3 F (37.4 C), temperature source Oral, resp. rate 17, height 5\' 8"  (1.727 m), weight 85.4 kg, SpO2 98 %. General - Well nourished, well developed elderly african [place] male, in no apparent distress. Cardiovascular - Regular rate and rhythm. Neurological Exam :  Awake alert oriented to time and place.  Mild  dysarthria    Follows commands well.  Extraocular movements are full range without nystagmus.  Blinks to threat bilaterally.  Moderate right lower facial weakness.  Tongue midline.  Right hemiplegia with right upper extremity 2/5 strength  Right lower extremity 3/5 strength.  Normal anti-gravity purposeful movements on the left side.  Tone is diminished on the right compared to the left.  Sensation is diminished on the right hemibody compared to the left.  Gait and Station - deferred.  Discharge Diet  Regular thin liquids  DISCHARGE PLAN  Disposition:  Transfer to [facility] for ongoing PT, OT and ST  aspirin 81 mg daily and clopidogrel 75 mg daily for secondary stroke prevention for 3 months then PLAVIX  alone.  Recommend ongoing stroke risk factor control by Primary Care Physician at time of discharge from inpatient rehabilitation.  Follow-up PCP in 2 weeks following discharge from rehab. Get one if you do not have.  Follow-up in [facility] in 4 weeks following discharge from rehab, office to schedule an appointment.   35 minutes were spent preparing discharge.  Annie Main, MSN, APRN, ANVP-BC, AGPCNP-BC Advanced Practice Stroke Nurse [facility] See Amion for Schedule & Pager information 06/05/2018 1:42 PM  I have personally obtained history,examined this patient, reviewed notes, independently viewed imaging studies, participated in medical decision making and plan of care.ROS completed by me personally and pertinent positives fully documented  I have made any additions or clarifications directly to the above note. Agree with note above.  Delia Heady, MD Medical Director [facility] Pager: [phone] 06/05/2018 4:09 PM

## 2018-06-04 NOTE — TOC Transition Note (Signed)
Transition of Care [facility]) - CM/SW Discharge Note   Patient Details  Name: Joe Wood MRN: [id number] Date of Birth: 1950/12/27  [facility] CM/SW Contact:  Kermit Balo, RN Phone Number: 06/04/2018, 12:39 PM   Clinical Narrative:    Pt is discharging to CIR today. CM signing off.    Final next level of care: IP Rehab Facility Barriers to Discharge: Barriers Resolved   Patient Goals and CMS Choice        Discharge Placement                       Discharge Plan and Services                                     Social Determinants of Health (SDOH) Interventions     Readmission Risk Interventions No flowsheet data found.

## 2018-06-05 ENCOUNTER — Other Ambulatory Visit: Payer: Self-pay

## 2018-06-05 ENCOUNTER — Inpatient Hospital Stay (HOSPITAL_COMMUNITY)
Admission: RE | Admit: 2018-06-05 | Discharge: 2018-06-27 | DRG: 057 | Disposition: A | Discharge status: Home Health | Payer: Medicare Other | Source: Intra-hospital | Attending: Physical Medicine & Rehabilitation | Admitting: Physical Medicine & Rehabilitation

## 2018-06-05 ENCOUNTER — Encounter (HOSPITAL_COMMUNITY): Payer: Self-pay | Admitting: Interventional Radiology

## 2018-06-05 DIAGNOSIS — G8191 Hemiplegia, unspecified affecting right dominant side: Secondary | ICD-10-CM

## 2018-06-05 DIAGNOSIS — E119 Type 2 diabetes mellitus without complications: Secondary | ICD-10-CM

## 2018-06-05 DIAGNOSIS — I639 Cerebral infarction, unspecified: Secondary | ICD-10-CM | POA: Diagnosis not present

## 2018-06-05 DIAGNOSIS — I69392 Facial weakness following cerebral infarction: Secondary | ICD-10-CM

## 2018-06-05 DIAGNOSIS — G479 Sleep disorder, unspecified: Secondary | ICD-10-CM | POA: Diagnosis present

## 2018-06-05 DIAGNOSIS — K5901 Slow transit constipation: Secondary | ICD-10-CM

## 2018-06-05 DIAGNOSIS — R269 Unspecified abnormalities of gait and mobility: Secondary | ICD-10-CM

## 2018-06-05 DIAGNOSIS — I69322 Dysarthria following cerebral infarction: Secondary | ICD-10-CM | POA: Diagnosis not present

## 2018-06-05 DIAGNOSIS — R7309 Other abnormal glucose: Secondary | ICD-10-CM | POA: Diagnosis not present

## 2018-06-05 DIAGNOSIS — I6381 Other cerebral infarction due to occlusion or stenosis of small artery: Secondary | ICD-10-CM | POA: Diagnosis present

## 2018-06-05 DIAGNOSIS — Z7982 Long term (current) use of aspirin: Secondary | ICD-10-CM

## 2018-06-05 DIAGNOSIS — I10 Essential (primary) hypertension: Secondary | ICD-10-CM

## 2018-06-05 DIAGNOSIS — Z23 Encounter for immunization: Secondary | ICD-10-CM

## 2018-06-05 DIAGNOSIS — E871 Hypo-osmolality and hyponatremia: Secondary | ICD-10-CM | POA: Diagnosis present

## 2018-06-05 DIAGNOSIS — I69351 Hemiplegia and hemiparesis following cerebral infarction affecting right dominant side: Principal | ICD-10-CM

## 2018-06-05 DIAGNOSIS — R0989 Other specified symptoms and signs involving the circulatory and respiratory systems: Secondary | ICD-10-CM | POA: Diagnosis not present

## 2018-06-05 DIAGNOSIS — E785 Hyperlipidemia, unspecified: Secondary | ICD-10-CM | POA: Diagnosis present

## 2018-06-05 DIAGNOSIS — F1011 Alcohol abuse, in remission: Secondary | ICD-10-CM

## 2018-06-05 DIAGNOSIS — I69398 Other sequelae of cerebral infarction: Secondary | ICD-10-CM

## 2018-06-05 LAB — GLUCOSE, CAPILLARY
Glucose-Capillary: 134 mg/dL — ABNORMAL HIGH (ref 70–99)
Glucose-Capillary: 142 mg/dL — ABNORMAL HIGH (ref 70–99)
Glucose-Capillary: 143 mg/dL — ABNORMAL HIGH (ref 70–99)
Glucose-Capillary: 146 mg/dL — ABNORMAL HIGH (ref 70–99)
Glucose-Capillary: 170 mg/dL — ABNORMAL HIGH (ref 70–99)
Glucose-Capillary: 191 mg/dL — ABNORMAL HIGH (ref 70–99)

## 2018-06-05 LAB — CBC
HCT: 40.7 % (ref 39.0–52.0)
Hemoglobin: 14.1 g/dL (ref 13.0–17.0)
MCH: 27.9 pg (ref 26.0–34.0)
MCHC: 34.6 g/dL (ref 30.0–36.0)
MCV: 80.6 fL (ref 80.0–100.0)
Platelets: 319 10*3/uL (ref 150–400)
RBC: 5.05 MIL/uL (ref 4.22–5.81)
RDW: 12.5 % (ref 11.5–15.5)
WBC: 6.5 10*3/uL (ref 4.0–10.5)
nRBC: 0 % (ref 0.0–0.2)

## 2018-06-05 LAB — BASIC METABOLIC PANEL
Anion gap: 13 (ref 5–15)
BUN: 13 mg/dL (ref 8–23)
CO2: 23 mmol/L (ref 22–32)
Calcium: 9.8 mg/dL (ref 8.9–10.3)
Chloride: 100 mmol/L (ref 98–111)
Creatinine, Ser: 1 mg/dL (ref 0.61–1.24)
GFR calc Af Amer: >60 mL/min (ref >60)
GFR calc non Af Amer: >60 mL/min (ref >60)
Glucose, Bld: 162 mg/dL — ABNORMAL HIGH (ref 70–99)
Potassium: 3.5 mmol/L (ref 3.5–5.1)
Sodium: 136 mmol/L (ref 135–145)

## 2018-06-05 MED ORDER — INSULIN ASPART 100 UNIT/ML ~~LOC~~ SOLN
0.0000 [IU] | Freq: Three times a day (TID) | SUBCUTANEOUS | Status: DC
  Administered 2018-06-05 – 2018-06-06 (×2): 2 [IU] via SUBCUTANEOUS
  Administered 2018-06-06: 1 [IU] via SUBCUTANEOUS
  Administered 2018-06-06 – 2018-06-08 (×7): 2 [IU] via SUBCUTANEOUS
  Administered 2018-06-08 (×2): 1 [IU] via SUBCUTANEOUS
  Administered 2018-06-08: 2 [IU] via SUBCUTANEOUS
  Administered 2018-06-09 – 2018-06-10 (×6): 1 [IU] via SUBCUTANEOUS
  Administered 2018-06-10 – 2018-06-11 (×2): 2 [IU] via SUBCUTANEOUS
  Administered 2018-06-11 (×2): 1 [IU] via SUBCUTANEOUS
  Administered 2018-06-11: 13:00:00 2 [IU] via SUBCUTANEOUS
  Administered 2018-06-12 (×2): 1 [IU] via SUBCUTANEOUS
  Administered 2018-06-12: 2 [IU] via SUBCUTANEOUS
  Administered 2018-06-13 – 2018-06-14 (×5): 1 [IU] via SUBCUTANEOUS
  Administered 2018-06-14 (×2): 2 [IU] via SUBCUTANEOUS
  Administered 2018-06-15: 1 [IU] via SUBCUTANEOUS
  Administered 2018-06-15: 2 [IU] via SUBCUTANEOUS
  Administered 2018-06-15: 1 [IU] via SUBCUTANEOUS
  Administered 2018-06-15: 06:00:00 2 [IU] via SUBCUTANEOUS
  Administered 2018-06-15 – 2018-06-17 (×5): 1 [IU] via SUBCUTANEOUS
  Administered 2018-06-17 – 2018-06-18 (×2): 2 [IU] via SUBCUTANEOUS
  Administered 2018-06-18 – 2018-06-19 (×4): 1 [IU] via SUBCUTANEOUS
  Administered 2018-06-19 (×2): 2 [IU] via SUBCUTANEOUS
  Administered 2018-06-20 – 2018-06-23 (×10): 1 [IU] via SUBCUTANEOUS
  Administered 2018-06-23: 2 [IU] via SUBCUTANEOUS
  Administered 2018-06-23 – 2018-06-24 (×3): 1 [IU] via SUBCUTANEOUS
  Administered 2018-06-24: 2 [IU] via SUBCUTANEOUS
  Administered 2018-06-24 – 2018-06-26 (×7): 1 [IU] via SUBCUTANEOUS
  Administered 2018-06-27: 2 [IU] via SUBCUTANEOUS

## 2018-06-05 MED ORDER — PANTOPRAZOLE SODIUM 40 MG PO TBEC
40.0000 mg | DELAYED_RELEASE_TABLET | Freq: Every day | ORAL | Status: DC
  Administered 2018-06-06 – 2018-06-27 (×22): 40 mg via ORAL
  Filled 2018-06-05 (×22): qty 1

## 2018-06-05 MED ORDER — ADULT MULTIVITAMIN W/MINERALS CH
1.0000 | ORAL_TABLET | Freq: Every day | ORAL | Status: DC
  Administered 2018-06-06 – 2018-06-27 (×22): 1 via ORAL
  Filled 2018-06-05 (×22): qty 1

## 2018-06-05 MED ORDER — ASPIRIN EC 81 MG PO TBEC
81.0000 mg | DELAYED_RELEASE_TABLET | Freq: Every day | ORAL | Status: DC
  Administered 2018-06-06 – 2018-06-27 (×22): 81 mg via ORAL
  Filled 2018-06-05 (×22): qty 1

## 2018-06-05 MED ORDER — ACETAMINOPHEN 650 MG RE SUPP: 650.0000 mg | RECTAL | Status: DC | PRN

## 2018-06-05 MED ORDER — CLOPIDOGREL BISULFATE 75 MG PO TABS
75.0000 mg | ORAL_TABLET | Freq: Every day | ORAL | Status: DC
  Administered 2018-06-06 – 2018-06-27 (×22): 75 mg via ORAL
  Filled 2018-06-05 (×22): qty 1

## 2018-06-05 MED ORDER — AMLODIPINE BESYLATE 10 MG PO TABS
10.0000 mg | ORAL_TABLET | Freq: Every day | ORAL | Status: DC
  Administered 2018-06-06 – 2018-06-27 (×22): 10 mg via ORAL
  Filled 2018-06-05: qty 2
  Filled 2018-06-05 (×21): qty 1

## 2018-06-05 MED ORDER — PNEUMOCOCCAL VAC POLYVALENT 25 MCG/0.5ML IJ INJ
0.5000 mL | INJECTION | INTRAMUSCULAR | Status: AC
  Administered 2018-06-06: 0.5 mL via INTRAMUSCULAR
  Filled 2018-06-05: qty 0.5

## 2018-06-05 MED ORDER — ACETAMINOPHEN 325 MG PO TABS: 650.0000 mg | ORAL_TABLET | ORAL | Status: DC | PRN

## 2018-06-05 MED ORDER — ENOXAPARIN SODIUM 40 MG/0.4ML ~~LOC~~ SOLN: 40.0000 mg | SUBCUTANEOUS | Status: DC

## 2018-06-05 MED ORDER — HYDROCHLOROTHIAZIDE 12.5 MG PO CAPS
12.5000 mg | ORAL_CAPSULE | Freq: Every day | ORAL | Status: DC
  Administered 2018-06-06 – 2018-06-27 (×22): 12.5 mg via ORAL
  Filled 2018-06-05 (×22): qty 1

## 2018-06-05 MED ORDER — ATORVASTATIN CALCIUM 40 MG PO TABS
40.0000 mg | ORAL_TABLET | Freq: Every day | ORAL | Status: DC
  Administered 2018-06-05 – 2018-06-26 (×22): 40 mg via ORAL
  Filled 2018-06-05 (×22): qty 1

## 2018-06-05 MED ORDER — VITAMIN B-1 100 MG PO TABS
100.0000 mg | ORAL_TABLET | Freq: Every day | ORAL | Status: DC
  Administered 2018-06-06 – 2018-06-27 (×22): 100 mg via ORAL
  Filled 2018-06-05 (×22): qty 1

## 2018-06-05 MED ORDER — METOPROLOL TARTRATE 25 MG PO TABS
25.0000 mg | ORAL_TABLET | Freq: Two times a day (BID) | ORAL | Status: DC
  Administered 2018-06-05 – 2018-06-11 (×12): 25 mg via ORAL
  Filled 2018-06-05 (×12): qty 1

## 2018-06-05 MED ORDER — SENNOSIDES-DOCUSATE SODIUM 8.6-50 MG PO TABS
1.0000 | ORAL_TABLET | Freq: Every evening | ORAL | Status: DC | PRN
  Administered 2018-06-19: 1 via ORAL
  Filled 2018-06-05: qty 1

## 2018-06-05 MED ORDER — ACETAMINOPHEN 160 MG/5ML PO SOLN: 650.0000 mg | ORAL | Status: DC | PRN

## 2018-06-05 MED ORDER — FOLIC ACID 1 MG PO TABS
1.0000 mg | ORAL_TABLET | Freq: Every day | ORAL | Status: DC
  Administered 2018-06-06 – 2018-06-27 (×22): 1 mg via ORAL
  Filled 2018-06-05 (×22): qty 1

## 2018-06-05 MED ORDER — ENOXAPARIN SODIUM 40 MG/0.4ML ~~LOC~~ SOLN
40.0000 mg | SUBCUTANEOUS | Status: DC
  Administered 2018-06-06 – 2018-06-27 (×22): 40 mg via SUBCUTANEOUS
  Filled 2018-06-05 (×22): qty 0.4

## 2018-06-05 MED ORDER — LIVING WELL WITH DIABETES BOOK
Freq: Once | Status: DC
  Filled 2018-06-05: qty 1

## 2018-06-05 MED ORDER — INSULIN ASPART 100 UNIT/ML ~~LOC~~ SOLN: 0.0000 [IU] | SUBCUTANEOUS | Status: DC

## 2018-06-05 MED ORDER — THIAMINE HCL 100 MG/ML IJ SOLN
100.0000 mg | Freq: Every day | INTRAMUSCULAR | Status: DC
  Filled 2018-06-05 (×5): qty 2

## 2018-06-05 NOTE — Progress Notes (Signed)
Patient ID: Joe Wood, male   DOB: 1950/05/17, 68 y.o.   MRN: [id number] Patient arrived from 3W10 via bed with patient belongings, RN, and NT. Patient oriented to room, rehab process, schedule, fall prevention plan, safety plan, and nurse call system. Skin assessment complete and documented. Admission education complete and documented. Patient resting comfortably in bed with call bell at side, bed alarm on and no complaints of pain.

## 2018-06-05 NOTE — H&P (Signed)
Physical Medicine and Rehabilitation Admission H&P        Chief Complaint  Patient presents with   Code Stroke  Chief complaint: Right side weakness with slurred speech HPI: Joe Wood is a 68 year old right-handed male with history of hypertension questionable medical compliance as well as alcohol use.  Per chart review lives with spouse.  Independent prior to admission.  One level home 2 steps to entry.  He is retired.  Presented 05/29/2018 with right-sided weakness facial droop and slurred speech.  Systolic blood pressure in the 240s.  Placed on Cleviprex drip.  Cranial CT scan showed age indeterminate lacunar infarct left thalamus.  Negative for hemorrhage.  CT angiogram of head and neck no emergent large vessel occlusion.  Patient did receive TPA.  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  Echocardiogram with ejection fraction of 65% normal systolic function.    Cerebral angiogram 06/02/2018 per interventional radiology showed severe preocclusive mid basilar artery stenosis with no plan for intervention.  Neurology follow-up maintained on aspirin and Plavix x3 months then Plavix alone.  Subcutaneous Lovenox for DVT prophylaxis.  New findings of elevated hemoglobin A1c of 6.5.  Sliding scale insulin with diabetic teaching.  Tolerating a regular diet.  Therapy evaluations completed and patient was admitted for a comprehensive rehab program   Review of Systems  Constitutional: Negative for chills and fever.  HENT: Negative for hearing loss.   Eyes: Negative for blurred vision and double vision.  Respiratory: Negative for cough and shortness of breath.   Cardiovascular: Negative for chest pain, palpitations and leg swelling.  Gastrointestinal: Positive for constipation. Negative for heartburn, nausea and vomiting.  Genitourinary: Negative for dysuria, flank pain  and hematuria.  Musculoskeletal: Positive for joint pain and myalgias.  Skin: Negative for rash.  Neurological: Positive for speech change and focal weakness.  All other systems reviewed and are negative.       Past Medical History:  Diagnosis Date   Hypertension      History reviewed. No pertinent surgical history. Family History  Problem Relation Age of Onset   Cancer Father      Social History:  reports that he has never smoked. He does not have any smokeless tobacco history on file. He reports current alcohol use. He reports that he does not use drugs. Allergies: No Known Allergies       Medications Prior to Admission  Medication Sig Dispense Refill   aspirin 81 MG tablet Take 81 mg by mouth daily.       atorvastatin (LIPITOR) 40 MG tablet Take 10 mg by mouth daily.        metoprolol succinate (TOPROL-XL) 100 MG 24 hr tablet Take 100 mg by mouth daily. Take with or immediately following a meal.       Olmesartan-amLODIPine-HCTZ (TRIBENZOR) 40-10-12.5 MG TABS Take 1 tablet by mouth daily.           Drug Regimen Review Drug regimen was reviewed and remains appropriate with no significant issues identified   Home: Home Living Family/patient expects to be discharged to:: Private residence Living Arrangements: Spouse/significant other Available Help at Discharge: Family, Available 24 hours/day Type of Home: House Home Access: Stairs to enter [employer] of Steps: 2 Home Layout: One level Bathroom Shower/Tub: Engineer, manufacturing systems: Standard Home Equipment: None  Lives With: Spouse   Functional History: Prior Function  Level of Independence: Independent Comments: Retired Administrator, 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 Enjoys yard work   Functional Status:  Mobility: Bed Mobility Overal bed mobility: Needs Assistance Bed Mobility: Rolling, Supine to Sit Rolling: Min assist Supine to sit: Mod assist General bed mobility comments: continues to required increased  assist to problem solve sequencing and perform physical mobility to EOB. Increased time and effort. Transfers Overall transfer level: Needs assistance Equipment used: 2 person hand held assist, Rolling walker (2 wheeled) Transfers: Sit to/from Stand, Stand Pivot Transfers Sit to Stand: Mod assist, +2 physical assistance(sit to stand X 3 reps with trial of RW) Stand pivot transfers: Mod assist, +2 physical assistance, +2 safety/equipment General transfer comment: Moderate assist to power to standing with facilitation of hand placement for push off and RLE knee block and hip control during power up. +2 physical assist to maintain upright with cues for posture and postioning and needs assistance to keep Rt UE onto RW  Ambulation/Gait Ambulation/Gait assistance: Max assist, +2 physical assistance Gait Distance (Feet): 5 Feet Assistive device: 2 person hand held assist, Rolling walker (2 wheeled) Gait Pattern/deviations: Step-to pattern, Decreased step length - right, Decreased stance time - right, Shuffle, Trunk flexed General Gait Details: Trialed RW today, Patient required manual facilitation of weight shift and advancement of RLE. Manual assist for placement of Rt hand on RW and to keep his Rt hand on RW.  2 person physical assist required for RW managment, balance, and safety Gait velocity interpretation: <1.31 ft/sec, indicative of household ambulator   ADL: ADL Overall ADL's : Needs assistance/impaired Eating/Feeding: Set up, Sitting Grooming: Wash/dry hands, Wash/dry face, Minimal assistance, Sitting Grooming Details (indicate cue type and reason): min assist to wash L hand (hand over hand support), multimodal cueing to wash R side of face Upper Body Bathing: Moderate assistance, Sitting Upper Body Bathing Details (indicate cue type and reason): mod assist (hand over hand) to coordinate and wash L UE using R UE  Lower Body Bathing: Sit to/from stand, Maximal assistance Upper Body Dressing  : Moderate assistance, Sitting Lower Body Dressing: Maximal assistance, Sit to/from stand Toilet Transfer: Moderate assistance, +2 for physical assistance, +2 for safety/equipment, Ambulation, BSC Toileting- Clothing Manipulation and Hygiene: Total assistance, Sit to/from stand Functional mobility during ADLs: Moderate assistance, Maximal assistance, +2 for physical assistance, +2 for safety/equipment General ADL Comments: pt limited by R sided hemiparesis, impaired coordination, cognition and decreased activity tolerance   Cognition: Cognition Overall Cognitive Status: No family/caregiver present to determine baseline cognitive functioning Arousal/Alertness: Awake/alert Orientation Level: Oriented to person, Disoriented to place, Disoriented to time, Disoriented to situation Attention: Focused, Sustained Focused Attention: Appears intact Sustained Attention: Appears intact Memory: Impaired Memory Impairment: Retrieval deficit, Decreased recall of new information(Immediate: 3/3; Delayed 0/3 with cues: 1/3) Awareness: Appears intact Problem Solving: Appears intact(5/5) Cognition Arousal/Alertness: Awake/alert Behavior During Therapy: WFL for tasks assessed/performed Overall Cognitive Status: No family/caregiver present to determine baseline cognitive functioning Area of Impairment: Attention, Safety/judgement, Awareness, Problem solving Current Attention Level: Selective Safety/Judgement: Decreased awareness of safety, Decreased awareness of deficits Awareness: Intellectual, Emergent Problem Solving: Slow processing, Requires verbal cues, Requires tactile cues General Comments: Following commands during session with some increased time, some perseveration noted during task performance. Multi modal cues for sequencing through gait training and task performance.   Physical Exam: Blood pressure (!) 159/104, pulse 85, temperature 98.6 F (37 C), temperature source Oral, resp. rate 15,  height 5\' 8"  (1.727 m), weight 85.4 kg, SpO2 94 %. Physical Exam  Neurological:  Patient is alert.  Makes eye contact with examiner.  Speech is dysarthric but intelligible.  Noted facial droop.  Follows commands.    General: No acute distress Mood and affect are appropriate Heart: Regular rate and rhythm no rubs murmurs or extra sounds Lungs: Clear to auscultation, breathing unlabored, no rales or wheezes Abdomen: Positive bowel sounds, soft nontender to palpation, nondistended Extremities: No clubbing, cyanosis, or edema Skin: No evidence of breakdown, no evidence of rash Neurologic: Cranial nerves II through XII intact, motor strength is 5/5 in left and 4/5 in right deltoid, bicep, tricep, grip, hip flexor, knee extensors, ankle dorsiflexor and plantar flexor Sensory exam normal sensation to light touch and proprioception in bilateral upper and lower extremities Cerebellar exam mild dysmetria right finger-nose-finger Musculoskeletal: Full range of motion in all 4 extremities. No joint swelling  Speech is with mild dysarthria Lab Results Last 48 Hours        Results for orders placed or performed during the hospital encounter of 05/29/18 (from the past 48 hour(s))  Glucose, capillary     Status: Abnormal    Collection Time: 06/03/18  7:56 AM  Result Value Ref Range    Glucose-Capillary 158 (H) 70 - 99 mg/dL    Comment 1 Notify RN      Comment 2 Document in Chart    Glucose, capillary     Status: Abnormal    Collection Time: 06/03/18 11:46 AM  Result Value Ref Range    Glucose-Capillary 232 (H) 70 - 99 mg/dL    Comment 1 Notify RN      Comment 2 Document in Chart    Glucose, capillary     Status: Abnormal    Collection Time: 06/03/18  3:35 PM  Result Value Ref Range    Glucose-Capillary 131 (H) 70 - 99 mg/dL    Comment 1 Notify RN      Comment 2 Document in Chart    Glucose, capillary     Status: Abnormal    Collection Time: 06/03/18  7:57 PM  Result Value Ref Range     Glucose-Capillary 120 (H) 70 - 99 mg/dL  Glucose, capillary     Status: Abnormal    Collection Time: 06/03/18  8:59 PM  Result Value Ref Range    Glucose-Capillary 145 (H) 70 - 99 mg/dL  Glucose, capillary     Status: Abnormal    Collection Time: 06/04/18 12:05 AM  Result Value Ref Range    Glucose-Capillary 135 (H) 70 - 99 mg/dL  Glucose, capillary     Status: Abnormal    Collection Time: 06/04/18  3:43 AM  Result Value Ref Range    Glucose-Capillary 126 (H) 70 - 99 mg/dL  Glucose, capillary     Status: Abnormal    Collection Time: 06/04/18  7:43 AM  Result Value Ref Range    Glucose-Capillary 149 (H) 70 - 99 mg/dL  Glucose, capillary     Status: Abnormal    Collection Time: 06/04/18 12:15 PM  Result Value Ref Range    Glucose-Capillary 246 (H) 70 - 99 mg/dL  Glucose, capillary     Status: Abnormal    Collection Time: 06/04/18  5:04 PM  Result Value Ref Range    Glucose-Capillary 168 (H) 70 - 99 mg/dL  Glucose, capillary     Status: Abnormal    Collection Time: 06/04/18 10:05 PM  Result Value Ref Range    Glucose-Capillary 162 (H) 70 - 99 mg/dL    Comment 1 Notify  RN      Comment 2 Document in Chart    Glucose, capillary     Status: Abnormal    Collection Time: 06/05/18 12:35 AM  Result Value Ref Range    Glucose-Capillary 134 (H) 70 - 99 mg/dL  Glucose, capillary     Status: Abnormal    Collection Time: 06/05/18  4:39 AM  Result Value Ref Range    Glucose-Capillary 142 (H) 70 - 99 mg/dL  CBC     Status: None    Collection Time: 06/05/18  5:00 AM  Result Value Ref Range    WBC 6.5 4.0 - 10.5 K/uL    RBC 5.05 4.22 - 5.81 MIL/uL    Hemoglobin 14.1 13.0 - 17.0 g/dL    HCT 16.1 09.6 - 04.5 %    MCV 80.6 80.0 - 100.0 fL    MCH 27.9 26.0 - 34.0 pg    MCHC 34.6 30.0 - 36.0 g/dL    RDW 40.9 81.1 - 91.4 %    Platelets 319 150 - 400 K/uL    nRBC 0.0 0.0 - 0.2 %      Comment: Performed at [facility], [address] [place], [place] [address]      Imaging Results  (Last 48 hours)  No results found.           Medical Problem List and Plan: 1.  Right side weakness facial droop with dysarthria secondary to leftBG/CR and right punctate BG infarcts.  Status post TPA.  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 CIR eval's in a.m. 2.  Antithrombotics: -DVT/anticoagulation: Subcutaneous Lovenox             -antiplatelet therapy: Aspirin 81 mg daily, Plavix 75 mg daily x3 months then Plavix alone 3. Pain Management: Tylenol as needed 4. Mood: Provide emotional support             -antipsychotic agents: N/A 5. Neuropsych: This patient is capable of making decisions on his own behalf. 6. Skin/Wound Care: Routine skin checks 7. Fluids/Electrolytes/Nutrition: Routine in and outs with follow-up chemistries 8.  Hypertension with questionable medical noncompliance.  HCTZ 12.5 mg daily Lopressor 25 mg twice daily, Norvasc 10 mg daily.  Provide counseling, will allow permissive hypertension for approximately 1 week Vitals:   06/05/18 1555  BP: (!) 170/102  Pulse: 92  Resp: 17  Temp: 98.7 F (37.1 C)  SpO2: 100%   9.  Hyperlipidemia.  Lipitor 10.  History of alcohol use.  Counseling 11.  New findings type 2 diabetes mellitus.  Hemoglobin A1c 6.5.  SSI.  Diabetic teaching   Post Admission Physician Evaluation: 1. Functional deficits secondary  to Left corona radiata infarct 2. Patient admitted to receive collaborative, interdisciplinary care between the physiatrist, rehab nursing staff, and therapy team. 3. Patient's level of medical complexity and substantial therapy needs in context of that medical necessity cannot be provided at a lesser intensity of care. 4. Patient has experienced substantial functional loss from his/her baseline. Judging by the patient's diagnosis, physical exam, and functional history, the patient has potential for functional progress which will result in measurable gains while on inpatient rehab.   These gains will be of substantial and practical use upon discharge in facilitating mobility and self-care at the household level. 5. Physiatrist will provide 24 hour management of medical needs as well as oversight of the therapy plan/treatment and provide guidance as appropriate regarding the interaction of the two. 6.  24 hour rehab nursing will assist in the management of  bladder management, bowel management, safety, skin/wound care, disease management, medication administration, pain management and patient education  and help integrate therapy concepts, techniques,education, etc. 7. PT will assess and treat for:pre gait, gait training, endurance , safety, equipment, neuromuscular re education  .  Goals are: supervision. 8. OT will assess and treat for ADLs, Cognitive perceptual skills, Neuromuscular re education, safety, endurance, equipment  .  Goals are: minimal assist.  9. SLP will assess and treat for Cognition and communication   .  Goals are: supervision. 10. Case Management and Social Worker will assess and treat for psychological issues and discharge planning. 11. Team conference will be held weekly to assess progress toward goals and to determine barriers to discharge. 12.  Patient will receive at least 3 hours of therapy per day at least 5 days per week. 13. ELOS and Prognosis: 16-20d good   "I have personally performed a face to face diagnostic evaluation of this patient.  Additionally, I have reviewed and concur with the physician assistant's documentation above." Erick Colace M.D. [facility] FAAPM&R (Sports Med, Neuromuscular Med) Diplomate Am Board of Electrodiagnostic Med    Lynnae Prude 06/05/2018

## 2018-06-05 NOTE — Progress Notes (Signed)
Inpatient Diabetes Program Recommendations  AACE/ADA: New Consensus Statement on Inpatient Glycemic Control (2015)  Target Ranges:  Prepandial:   less than 140 mg/dL      Peak postprandial:   less than 180 mg/dL (1-2 hours)      Critically ill patients:  140 - 180 mg/dL  Results for Joe Wood, Joe Wood (MRN [id number]) as of 06/05/2018 13:55  Ref. Range 06/04/2018 07:43 06/04/2018 12:15 06/04/2018 17:04 06/04/2018 22:05 06/05/2018 00:35 06/05/2018 04:39 06/05/2018 07:35 06/05/2018 11:34  Glucose-Capillary Latest Ref Range: 70 - 99 mg/dL 162 (H) 446 (H) 950 (H) 162 (H) 134 (H) 142 (H) 146 (H) 191 (H)   Results for Joe Wood, Joe Wood (MRN [id number]) as of 06/05/2018 13:55  Ref. Range 05/30/2018 02:50  Hemoglobin A1C Latest Ref Range: 4.8 - 5.6 % 6.5 (H)   Review of Glycemic Control  Diabetes history: No Outpatient Diabetes medications: NA Current orders for Inpatient glycemic control: Novolog 0-9 units Q4H  Inpatient Diabetes Program Recommendations:   HgbA1C: A1C 6.5% on 05/30/18 indicating an average glucose of 140 mg/dl over the past 2-3 months.  MD may want to consider discharging patient on Metformin 500 mg daily and have patient follow up with a provider regarding glycemic control after discharge from rehab.  NOTE: Spoke with patient about new diabetes diagnosis. Patient states that he was told by the doctor here that he has borderline diabetes.  Discussed A1C results (6.5% on 05/30/18) and explained what an A1C is and discussed glucose trends.  Informed patient that his current A1C indicates an average glucose of 140 mg/dl over the past 2-3 months. Discussed basic pathophysiology of DM Type 2, basic home care, importance of checking CBGs and maintaining good CBG control to prevent long-term and short-term complications. Reviewed glucose and A1C goals. Reviewed signs and symptoms of hyperglycemia and hypoglycemia along with treatment for both. Discussed impact of nutrition, exercise, stress, sickness, and  medications on diabetes control.Informed patient he should be getting a Living Well with diabetes booklet from his nurse and encouraged patient to read through entire book. Also encouraged patient to share the information with his wife so she can learn about DM as well. Patient states that his wife buys their groceries. Discussed Carb Modified diet and encouraged patient to eliminate sugary beverages from his diet. Provided handout on Planning Healthy Meals and asked that he read over the information  have his wife read over the information as well. Patient will be discharging to CIR for rehabilitation.    Patient verbalized understanding of information discussed and he states that he has no further questions at this time related to diabetes.    Thanks, Orlando Penner, RN, MSN, CDE Diabetes Coordinator Inpatient Diabetes Program [phone] (Team Pager from 8am to 5pm)

## 2018-06-05 NOTE — Progress Notes (Signed)
  Speech Language Pathology Treatment: Cognitive-Linquistic(Dysarthria)  Patient Details Name: Joe Wood MRN: [id number] DOB: [birth year] Today's Date: 06/05/2018 Time: [id number]) SLP Time Calculation (min) (ACUTE ONLY): 18 min  Assessment / Plan / Recommendation Clinical Impression  Pt was seen for dysarthria treatment and was cooperative throughout the session. He completed labial and lingual ROM and strengthening exercises with min-mod cues. He was able to recall compensatory strategies for speech intelligibility with min. cues. He was able to use these compensatory strategies at the 5-7 word sentence level with 67% accuracy increasing to 93% accuracy with min-mod cues for rate and overarticulation. Pt currently has discharge orders and will benefit from continued SLP services following discharge.    HPI HPI: Pt is a 68 y.o. male past medical history of hypertension with questionable compliance to antihypertensives, who woke up on 05/29/18 and suddently collapsed when attempting to reach his phone. He was not able to move his right arm or leg and also had right facial weakness and slurred speech. IV tPA was administered. CT of the head revealed age-indeterminate lacunar infarct in the left thalamus but was negative for acute hemorrhage. 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      SLP Plan  Continue with current plan of care       Recommendations  Diet recommendations: Regular;Thin liquid Liquids provided via: Cup;Straw Medication Administration: Whole meds with liquid Supervision: Patient able to self feed Compensations: Minimize environmental distractions;Slow rate;Small sips/bites Postural Changes and/or Swallow Maneuvers: Seated upright 90 degrees                Oral Care Recommendations: Oral care BID Follow up Recommendations: Inpatient Rehab SLP Visit Diagnosis: Dysarthria and  anarthria (R47.1) Plan: Continue with current plan of care       Mayli Covington I. Vear Clock, MS, CCC-SLP Acute Rehabilitation Services Office number [phone] Pager [phone]                Scheryl Marten 06/05/2018, 12:37 PM

## 2018-06-05 NOTE — Progress Notes (Signed)
Physical Therapy Treatment Patient Details Name: Joe Wood MRN: [id number] DOB: 05/15/1950 Today's Date: 06/05/2018    History of Present Illness Pt is a 68 y.o. M with significant PMH of hypertension with questionable complaince to antihypertensives. Presents with right arm leg weakness, facial weakness and slurred speech. IV tPA was administered. 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    PT Comments    Pt showed good effort and some improvement today with functional mobility, sitting, balance and standing tolerance. He was able to stand 3 min with +2 support while working on upright posture and weight shifting onto his Rt side. PT still recommending CIR and will continue to progress his as able.  Follow Up Recommendations  CIR     Equipment Recommendations  Other (comment)(tbd)    Recommendations for Other Services Rehab consult     Precautions / Restrictions Precautions Precautions: Fall Restrictions Weight Bearing Restrictions: No    Mobility  Bed Mobility Overal bed mobility: Needs Assistance Bed Mobility: Rolling;Supine to Sit Rolling: Min assist   Supine to sit: Mod assist;+2 for physical assistance        Transfers Overall transfer level: Needs assistance Equipment used: Rolling walker (2 wheeled) Transfers: Sit to/from UGI CorporationStand;Stand Pivot Transfers Sit to Stand: Mod assist;+2 physical assistance(sit to stand X 3 reps with trial of RW) Stand pivot transfers: Mod assist;+2 safety/equipment;+2 physical assistance       General transfer comment: Moderate assist to power to standing with facilitation of hand placement for push off and RLE knee block and hip control during power up and to place and keep placment of Rt UE on RW. +2 physical assist to maintain upright with cues for posture and to faciltate advanement and pivot of Rt LE  Ambulation/Gait Ambulation/Gait assistance: Mod assist;+2  safety/equipment Gait Distance (Feet): 5 Feet Assistive device: Rolling walker (2 wheeled) Gait Pattern/deviations: Step-to pattern;Decreased step length - right;Decreased stance time - right;Shuffle;Trunk flexed   Gait velocity interpretation: <1.31 ft/sec, indicative of household ambulator General Gait Details:  Patient required  Manual assist for placement of Rt hand on RW and to keep his Rt hand on RW.  2 person physical assist required for RW managment, balance, and safety and some assist to advance his Rt LE      Balance Overall balance assessment: Needs assistance Sitting-balance support: Feet supported Sitting balance-Leahy Scale: Poor(some improvement to a poor-fair category) Sitting balance - Comments: continues to present with lateral lean and poor midline awareness but able to correct with cues Postural control: Right lateral lean Standing balance support: Bilateral upper extremity supported Standing balance-Leahy Scale: Poor Standing balance comment: continues to required moderate to max assist to maintain upright and facilitate posture and positioning, RLE blocked out           Cognition Arousal/Alertness: Awake/alert Behavior During Therapy: WFL for tasks assessed/performed                                   General Comments: Following commands during session with some increased time, some perseveration noted during task performance. Multi modal cues for sequencing through gait training and task performance.      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  PT Goal Formulation: With patient Time For Goal Achievement:  06/13/18 Potential to Achieve Goals: Good    Frequency    Min 4X/week      PT Plan Current plan remains appropriate    Co-evaluation     PT goals addressed during session: Mobility/safety with mobility;Balance;Proper use of DME;Strengthening/ROM        AM-PAC PT "6 Clicks" Mobility   Outcome Measure  Help needed turning from your back to your side while in a flat bed without using bedrails?: A Little Help needed moving from lying on your back to sitting on the side of a flat bed without using bedrails?: A Lot Help needed moving to and from a bed to a chair (including a wheelchair)?: A Lot Help needed standing up from a chair using your arms (e.g., wheelchair or bedside chair)?: A Lot Help needed to walk in hospital room?: A Lot Help needed climbing 3-5 steps with a railing? : Total 6 Click Score: 12    End of Session Equipment Utilized During Treatment: Gait belt Activity Tolerance: Patient tolerated treatment well Patient left: in chair;with call bell/phone within reach;with chair alarm set Nurse Communication: Mobility status PT Visit Diagnosis: Other symptoms and signs involving the nervous system (R29.898);Other abnormalities of gait and mobility (R26.89);Hemiplegia and hemiparesis;Unsteadiness on feet (R26.81) Hemiplegia - Right/Left: Right Hemiplegia - dominant/non-dominant: Dominant Hemiplegia - caused by: Cerebral infarction     Time: 4431-5400 PT Time Calculation (min) (ACUTE ONLY): 23 min  Charges:  $Therapeutic Activity: 23-37 mins                    Ivery Quale, PT,DPT Acute Rehabilitation Services Office [phone]    April Manson 06/05/2018, 1:08 PM

## 2018-06-05 NOTE — Progress Notes (Signed)
Pt held from CIR d/c yesterday d/t elevated BP. Pt is discharging to CIR today. CM signing off.

## 2018-06-05 NOTE — IPOC Note (Signed)
Overall Plan of Care [facility](IPOC) Patient Details Name: Joe Wood MRN: [id number] DOB: 01/26/1950  Admitting Diagnosis: Right basal ganglia stroke.   Hospital Problems: Active Problems:   Basal ganglia stroke (HCC)   Cerebellar stroke, acute (HCC)   Acute cerebral infarction (HCC)   Hyponatremia   Diabetes mellitus, new onset (HCC)   Essential hypertension   History of alcohol abuse     Functional Problem List: Nursing Bladder, Bowel, Edema, Endurance, Medication Management, Perception, Safety, Sensory  PT Balance, Perception, Endurance, Motor, Safety  OT Balance, Cognition, Endurance, Motor, Perception  SLP Cognition  TR         Basic ADL's: OT Eating, Grooming, Bathing, Dressing, Toileting     Advanced  ADL's: OT       Transfers: PT Bed Mobility, Bed to Chair, Car, Occupational psychologisturniture  OT Toilet, Research scientist (life sciences)Tub/Shower     Locomotion: PT Ambulation, Psychologist, prison and probation servicesWheelchair Mobility, Stairs     Additional Impairments: OT Fuctional Use of Upper Extremity  SLP Communication, Social Cognition expression Problem Solving, Memory  TR      Anticipated Outcomes Item Anticipated Outcome  Self Feeding I  Swallowing      Basic self-care  s/u UB tasks, min A LB tasks  Toileting  min A   Bathroom Transfers min A  Bowel/Bladder  Mod I  Transfers  S  Locomotion  CGA for gait, S w/c  Communication  Mod I   Cognition  Min A   Pain  No complaints of pain; Mod I  Safety/Judgment  Minimal assistance   Therapy Plan: PT Intensity: Minimum of 1-2 x/day ,45 to 90 minutes PT Frequency: 5 out of 7 days PT Duration Estimated Length of Stay: 3 weeks OT Intensity: Minimum of 1-2 x/day, 45 to 90 minutes OT Frequency: 5 out of 7 days OT Duration/Estimated Length of Stay: 21-23 days SLP Intensity: Minumum of 1-2 x/day, 30 to 90 minutes SLP Frequency: 3 to 5 out of 7 days SLP Duration/Estimated Length of Stay: 3-4 weeks   Due to the current state of emergency, patients may not be receiving their  3-hours of Medicare-mandated therapy.   Team Interventions: Nursing Interventions Patient/Family Education, Bladder Management, Medication Management, Discharge Planning, Bowel Management, Psychosocial Support, Disease Management/Prevention  PT interventions Ambulation/gait training, DME/adaptive equipment instruction, Neuromuscular re-education, Stair training, UE/LE Strength taining/ROM, Wheelchair propulsion/positioning, Warden/rangerBalance/vestibular training, Cognitive remediation/compensation, Functional mobility training, Patient/family education, Therapeutic Exercise, UE/LE Coordination activities, Therapeutic Activities  OT Interventions Balance/vestibular training, Cognitive remediation/compensation, Discharge planning, DME/adaptive equipment instruction, Functional electrical stimulation, Functional mobility training, Psychosocial support, Patient/family education, Neuromuscular re-education, Pain management, Self Care/advanced ADL retraining, Therapeutic Activities, Therapeutic Exercise, UE/LE Strength taining/ROM, UE/LE Coordination activities, Visual/perceptual remediation/compensation  SLP Interventions Cognitive remediation/compensation, Cueing hierarchy, Functional tasks, Internal/external aids, Medication managment, Patient/family education, Speech/Language facilitation, Therapeutic Activities  TR Interventions    SW/CM Interventions Discharge Planning, Psychosocial Support, Patient/Family Education   Barriers to Discharge MD  Medical stability  Nursing Decreased caregiver support, Medical stability, Home environment access/layout, Medication compliance, New diabetic, Incontinence    PT      OT      SLP      SW       Team Discharge Planning: Destination: PT-Home ,OT- Home , SLP-Home Projected Follow-up: PT-Home health PT, 24 hour supervision/assistance, OT-  Home health OT, SLP-24 hour supervision/assistance Projected Equipment Needs: PT-To be determined, OT- Tub/shower bench,  SLP-None recommended by SLP Equipment Details: PT-likely w/c and RW, OT-  Patient/family involved in discharge planning: PT- Patient,  OT-Patient, SLP-Patient  MD ELOS: 19-22 days. Medical Rehab Prognosis:  Good Assessment: Joe Horn Lindsayis a 68 year old right-handed male with history of hypertension questionable medical compliance as well as alcohol use.  Presented 05/29/2018 with right-sided weakness facial droop and slurred speech. Systolic blood pressure in the 240s. Placed on Cleviprex drip. Cranial CT scan showed age indeterminate lacunar infarct left thalamus. Negative for hemorrhage. CT angiogram of head and neck no emergent large vessel occlusion. Patient did receive TPA. 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 Echocardiogram with ejection fraction of 65% normal systolic function. Cerebral angiogram 06/02/2018 per interventional radiology showed severe preocclusive mid basilar artery stenosis with no plan for intervention. Neurology follow-up maintained on aspirin and Plavix x3 months then Plavix alone.Subcutaneous Lovenox for DVT prophylaxis. New findings of elevated hemoglobin A1c of 6.5. Sliding scale insulin with diabetic teaching. Tolerating a regular diet. Patient with resulting functional deficits with mobility, transfers, self-care.  Will set goals for Min A with PT/OT/SLP.   See Team Conference Notes for weekly updates to the plan of care

## 2018-06-05 NOTE — Progress Notes (Signed)
Kirsteins, Luanna Salk, MD  Physician  Physical Medicine and Rehabilitation  PMR Pre-admission  Signed  Date of Service:  06/03/2018 12:52 PM       Related encounter: ED to Hosp-Admission (Current) from 05/29/2018 in [facility]      Signed         Show:Clear all _0 Manual_1 Template_2 Copied  Added by: _3 Charlett Blake, MD_4 Michel Santee, PT_5 Jhonnie Garner, OT  _6 Hover for details PMR Admission Coordinator Pre-Admission Assessment  Patient: Joe Wood is an 68 y.o., male MRN: [id number] DOB: 1950/12/19 Height: _7  (172.7 cm) Weight: 85.4 kg  Insurance Information HMO:     PPO: Yes     PCP:      IPA:      80/20:      OTHER:   PRIMARY: U[employer] Medicare      Policy#: [id number]      Subscriber: Patient CM Name: Auto approved on [email or web address], confirmed by Gaynelle Arabian     Phone#: [phone]     Fax#: [phone] Pre-Cert#: [id number]      Employer:  Josem Kaufmann for CIR auto-approved on 5/12 for 5/13 admission. Auth confirmed by Gaynelle Arabian. On 5/12. Pt is approved for 3 days (5/13-5/15) with clinical updates due to Dorthula Nettles on day 3 (5/15) to (p): [phone] (f): [phone] Benefits:  Phone #: Online     Name: [email or web address] Eff. Date: 01/22/18 still active     Deduct: $0      Out of Pocket Max: $4,000($0)      Life Max: NA CIR: $160/day for days 1-10, $0/day for days 11+      SNF: $0/day for days 1-20, $50/day for days 21-100; limited to 100 days/cal yr Outpatient: limited by medical necessity     Co-Pay: $20/visit Home Health: 100%, limited by medical necessity      Co-Pay: 0% DME: 80%     Co-Pay: 20% Providers:  SECONDARY: None       Policy#:       Subscriber:  CM Name:       Phone#:      Fax#:  Pre-Cert#:       Employer:  Benefits:  Phone #:      Name:  Eff. Date:      Deduct:       Out of Pocket Max:       Life Max:  CIR:       SNF:  Outpatient:      Co-Pay:  Home Health:      Co-Pay: DME:      Co-Pay:   Medicaid Application Date:        Case Manager: Disability Application Date:       Case Worker:   The "Data Collection Information Summary" for patients in Inpatient Rehabilitation Facilities with attached "Privacy Act [place] Records" was provided and verbally reviewed with: Patient and Family  Emergency Contact Information         Contact Information    Name Relation Home Work [place]   [phone]      Current Medical History  Patient Admitting Diagnosis: 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 Pt presented 05/29/2018 with right-sided weakness facial droop and slurred speech. Systolic blood pressure in the 240s. Placed on Cleviprex drip. Cranial CT scan showed age indeterminate lacunar infarct left thalamus. Negative for hemorrhage.  CT angiogram of head and neck no emergent large vessel occlusion. Patient did receive TPA. 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 Echocardiogram pending. Cerebral angiogram 06/02/2018 per interventional radiology showed severe preocclusive mid basilar artery stenosis with no plan for intervention. Neurology follow-up maintained on aspirin and Plavix x3 months then Plavix alone.Subcutaneous Lovenox for DVT prophylaxis. New findings of elevated hemoglobin A1c of 6.5. Sliding scale insulin with diabetic teaching. Tolerating a regular diet. Therapy evaluations completed with pt recommended for CIR. Pt is to be admitted for a comprehensive rehab program on 06/04/2018.   Complete NIHSS TOTAL: 12  Patient's medical record from [facility] has been reviewed by the rehabilitation admission coordinator and physician.  Past Medical  History      Past Medical History:  Diagnosis Date  . Hypertension     Family History   family history includes Cancer in his father.  Prior Rehab/Hospitalizations Has the patient had prior rehab or hospitalizations prior to admission? No  Has the patient had major surgery during 100 days prior to admission? No              Current Medications  Current Facility-Administered Medications:  .  0.9 %  sodium chloride infusion, , Intravenous, Continuous, Biby, Sharon L, NP, Last Rate: 75 mL/hr at 06/05/18 0056 .  acetaminophen (TYLENOL) tablet 650 mg, 650 mg, Oral, Q4H PRN **OR** acetaminophen (TYLENOL) solution 650 mg, 650 mg, Per Tube, Q4H PRN **OR** acetaminophen (TYLENOL) suppository 650 mg, 650 mg, Rectal, Q4H PRN, Biby, Sharon L, NP .  [DISCONTINUED] irbesartan (AVAPRO) tablet 300 mg, 300 mg, Oral, Daily **OR** amLODipine (NORVASC) tablet 10 mg, 10 mg, Oral, Daily, 10 mg at 06/05/18 0948 **OR** [DISCONTINUED] hydrochlorothiazide (MICROZIDE) capsule 12.5 mg, 12.5 mg, Oral, Daily, Rosalin Hawking, MD .  aspirin EC tablet 81 mg, 81 mg, Oral, Daily, Biby, Sharon L, NP, 81 mg at 06/05/18 0948 .  atorvastatin (LIPITOR) tablet 40 mg, 40 mg, Oral, q1800, Biby, Sharon L, NP, 40 mg at 06/04/18 1904 .  clopidogrel (PLAVIX) tablet 75 mg, 75 mg, Oral, Daily, Biby, Sharon L, NP, 75 mg at 06/05/18 0948 .  enoxaparin (LOVENOX) injection 40 mg, 40 mg, Subcutaneous, Q24H, Biby, Sharon L, NP, 40 mg at 06/05/18 0948 .  folic acid (FOLVITE) tablet 1 mg, 1 mg, Oral, Daily, Biby, Sharon L, NP, 1 mg at 06/05/18 0948 .  hydrochlorothiazide (MICROZIDE) capsule 12.5 mg, 12.5 mg, Oral, Daily, Biby, Sharon L, NP, 12.5 mg at 06/05/18 0948 .  insulin aspart (novoLOG) injection 0-9 Units, 0-9 Units, Subcutaneous, Q4H, Biby, Sharon L, NP, 1 Units at 06/05/18 0749 .  labetalol (NORMODYNE) injection 10-20 mg, 10-20 mg, Intravenous, Q10 min PRN, Biby, Sharon L, NP, 10 mg at 06/04/18 1036 .  living well with diabetes  book MISC, , Does not apply, Once, Garvin Fila, MD .  metoprolol tartrate (LOPRESSOR) tablet 25 mg, 25 mg, Oral, BID, Biby, Sharon L, NP, 25 mg at 06/05/18 0948 .  multivitamin with minerals tablet 1 tablet, 1 tablet, Oral, Daily, Biby, Sharon L, NP, 1 tablet at 06/05/18 0948 .  pantoprazole (PROTONIX) EC tablet 40 mg, 40 mg, Oral, Daily, Biby, Sharon L, NP, 40 mg at 06/05/18 0948 .  senna-docusate (Senokot-S) tablet 1 tablet, 1 tablet, Oral, QHS PRN, Biby, Sharon L, NP .  sodium chloride flush (NS) 0.9 % injection 10-40 mL, 10-40 mL, Intracatheter,  Q12H, Donzetta Starch, NP, 10 mL at 06/05/18 0949 .  sodium chloride flush (NS) 0.9 % injection 10-40 mL, 10-40 mL, Intracatheter, PRN, Burnetta Sabin L, NP .  thiamine (VITAMIN B-1) tablet 100 mg, 100 mg, Oral, Daily, 100 mg at 06/05/18 0948 **OR** thiamine (B-1) injection 100 mg, 100 mg, Intravenous, Daily, Biby, Massie Kluver, NP  Patients Current Diet:     Diet Order                  Diet regular Room service appropriate? No; Fluid consistency: Thin  Diet effective now               Precautions / Restrictions Precautions Precautions: Fall Restrictions Weight Bearing Restrictions: No   Has the patient had 2 or more falls or a fall with injury in the past year? No  Prior Activity Level Community (5-7x/wk): active and Indepedent PTA, no AD use. 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 Independent  Indoor Mobility: Did the patient need assistance with walking from room to room (with or without device)? Independent  Stairs: Did the patient need assistance with internal or external stairs (with or without device)? Independent  Functional Cognition: Did the patient need help planning regular tasks such as shopping or remembering to take medications? Independent  Home Assistive Devices / Equipment  Home Equipment: None  Prior Device Use: Indicate devices/aids used by the patient prior to current illness, exacerbation or injury? None of the above  Current Functional Level Cognition  Arousal/Alertness: Awake/alert Overall Cognitive Status: No family/caregiver present to determine baseline cognitive functioning Current Attention Level: Selective Orientation Level: Oriented to person, Disoriented to place, Disoriented to time, Disoriented to situation Safety/Judgement: Decreased awareness of safety, Decreased awareness of deficits General Comments: Following commands during session with some increased time, some perseveration noted during task performance. Multi modal cues for sequencing through gait training and task performance. Attention: Focused, Sustained Focused Attention: Appears intact Sustained Attention: Appears intact Memory: Impaired Memory Impairment: Retrieval deficit, Decreased recall of new information(Immediate: 3/3; Delayed 0/3 with cues: 1/3) Awareness: Appears intact Problem Solving: Appears intact(5/5)    Extremity Assessment (includes Sensation/Coordination)  Upper Extremity Assessment: RUE deficits/detail RUE Deficits / Details: pt with decreased awareness to R UE, able to perform gross grasp, FF to approx 45 degrees but limited by strength and coordination  RUE Sensation: decreased proprioception RUE Coordination: decreased fine motor, decreased gross motor  Lower Extremity Assessment: RLE deficits/detail RLE Deficits / Details: 1+/5 quad strength, 0/5 distally RLE Coordination: decreased gross motor    ADLs  Overall ADL's : Needs assistance/impaired Eating/Feeding: Set up, Sitting Grooming: Wash/dry hands, Wash/dry face, Minimal assistance, Sitting Grooming Details (indicate cue type and reason): min assist to wash L hand (hand over hand support), multimodal cueing to wash R side of face Upper Body Bathing: Moderate assistance, Sitting Upper Body  Bathing Details (indicate cue type and reason): mod assist (hand over hand) to coordinate and wash L UE using R UE  Lower Body Bathing: Sit to/from stand, Maximal assistance Upper Body Dressing : Moderate assistance, Sitting Lower Body Dressing: Maximal assistance, Sit to/from stand Toilet Transfer: Moderate assistance, +2 for physical assistance, +2 for safety/equipment, Ambulation, BSC Toileting- Clothing Manipulation and Hygiene: Total assistance, Sit to/from stand Functional mobility during ADLs: Moderate assistance, Maximal assistance, +2 for physical assistance, +2 for safety/equipment General ADL Comments: pt limited by R  sided hemiparesis, impaired coordination, cognition and decreased activity tolerance    Mobility  Overal bed mobility: Needs Assistance Bed Mobility: Rolling, Supine to Sit Rolling: Min assist Supine to sit: Mod assist General bed mobility comments: continues to required increased assist to problem solve sequencing and perform physical mobility to EOB. Increased time and effort.    Transfers  Overall transfer level: Needs assistance Equipment used: 2 person hand held assist, Rolling walker (2 wheeled) Transfers: Sit to/from Stand, Stand Pivot Transfers Sit to Stand: Mod assist, +2 physical assistance(sit to stand X 3 reps with trial of RW) Stand pivot transfers: Mod assist, +2 physical assistance, +2 safety/equipment General transfer comment: Moderate assist to power to standing with facilitation of hand placement for push off and RLE knee block and hip control during power up. +2 physical assist to maintain upright with cues for posture and postioning and needs assistance to keep Rt UE onto RW     Ambulation / Gait / Stairs / Wheelchair Mobility  Ambulation/Gait Ambulation/Gait assistance: Max assist, +2 physical assistance Gait Distance (Feet): 5 Feet Assistive device: 2 person hand held assist, Rolling walker (2 wheeled) Gait Pattern/deviations: Step-to  pattern, Decreased step length - right, Decreased stance time - right, Shuffle, Trunk flexed General Gait Details: Trialed RW today, Patient required manual facilitation of weight shift and advancement of RLE. Manual assist for placement of Rt hand on RW and to keep his Rt hand on RW.  2 person physical assist required for RW managment, balance, and safety Gait velocity interpretation: <1.31 ft/sec, indicative of household ambulator    Posture / Balance Dynamic Sitting Balance Sitting balance - Comments: continues to present with lateral lean and poor midline awareness but able to correct with cues Balance Overall balance assessment: Needs assistance Sitting-balance support: Feet supported Sitting balance-Leahy Scale: Poor Sitting balance - Comments: continues to present with lateral lean and poor midline awareness but able to correct with cues Postural control: Right lateral lean Standing balance support: Bilateral upper extremity supported Standing balance-Leahy Scale: Poor Standing balance comment: continues to required moderate to max assist to maintain upright and facilitate posture and positioning, RLE blocked out     Special needs/care consideration BiPAP/CPAP : no CPM : no Continuous Drip IV: 0.9% sodium chloride infusion  Dialysis : no        Days : no Life Vest : no Oxygen : no Special Bed : no Trach Size : no Wound Vac (area) : no      Location : no Skin: right groin incision from 06/02/18                       Bowel mgmt: last BM: 06/03/18 Bladder mgmt: external catheter, incontinence documented Diabetic mgmt: Yes Behavioral consideration : no Chemo/radiation : no   Previous Home Environment (from acute therapy documentation) Living Arrangements: Spouse/significant other  Lives With: Spouse Available Help at Discharge: Family, Available 24 hours/day Type of Home: House Home Layout: One level Home Access: Stairs to enter Technical brewer of Steps: 2 Bathroom  Shower/Tub: Chiropodist: Standard  Discharge Living Setting Plans for Discharge Living Setting: Patient's home, Lives with (comment)(lives with wife; son says there but out of town frequently ) Type of Home at Discharge: House Discharge Home Layout: One level Discharge Home Access: Stairs to enter Entrance Stairs-Rails: None Entrance Stairs-Number of Steps: 2 Discharge Bathroom Shower/Tub: Tub/shower unit Discharge Bathroom Toilet: Standard Discharge Bathroom Accessibility: Yes How Accessible: Accessible via walker Does  the patient have any problems obtaining your medications?: Yes (Describe)(per wife, due to financial reasons)  Social/Family/Support Systems Patient Roles: Spouse Contact Information: wife: Harmon Pier [phone]) Anticipated Caregiver: wife (son when he is home). they have a 2nd son who lives nearby in [place] who can assist/check in when not at work Anticipated Ambulance person Information: see above Ability/Limitations of Caregiver: Min A Caregiver Availability: 24/7 Discharge Plan Discussed with Primary Caregiver: Yes Is Caregiver In Agreement with Plan?: Yes Does Caregiver/Family have Issues with Lodging/Transportation while Pt is in Rehab?: No  Goals/Additional Needs Patient/Family Goal for Rehab: PT/OT: Min A; SLP: Mod I Expected length of stay: 16-20 days Cultural Considerations: NA Dietary Needs: regular diet, thin liquids Equipment Needs: TBD Pt/Family Agrees to Admission and willing to participate: Yes Program Orientation Provided & Reviewed with Pt/Caregiver Including Roles  & Responsibilities: Yes(pt and wife)  Barriers to Discharge: Home environment access/layout, Lack of/limited family support  Barriers to Discharge Comments: support, will have 24/7 Min A, may be additional assist-family to discuss if able   Decrease burden of Care through IP rehab admission: NA  Possible need for SNF placement upon discharge: Not  anticipated; pt has good family support and has good prognosis for further progress through CIR.   Patient Condition: I have reviewed medical records from [facility], spoken with RN, and patient and spouse. I met with patient at the bedside for inpatient rehabilitation assessment.  Patient will benefit from ongoing PT, OT and SLP, can actively participate in 3 hours of therapy a day 5 days of the week, and can make measurable gains during the admission.  Patient will also benefit from the coordinated team approach during an Inpatient Acute Rehabilitation admission.  The patient will receive intensive therapy as well as Rehabilitation physician, nursing, social worker, and care management interventions.  Due to bladder management, bowel management, safety, skin/wound care, disease management, medication administration, pain management and patient education the patient requires 24 hour a day rehabilitation nursing.  The patient is currently mod +2 with mobility and basic ADLs.  Discharge setting and therapy post discharge at home with home health is anticipated.  Patient has agreed to participate in the Acute Inpatient Rehabilitation Program and will admit today.  Preadmission Screen Completed By:  Jhonnie Garner, with brief updates provided by Shann Medal, PT, DPT on 06/05/2018 10:28 AM ______________________________________________________________________   Discussed status with Dr. Letta Pate on 06/05/18  at 10:29 AM  and received approval for admission today.  Admission Coordinator:  Jhonnie Garner time 10:29 AM Sudie Grumbling 06/05/18    Assessment/Plan: Diagnosis:Left corona radiata infarct 1. Does the need for close, 24 hr/day Medical supervision in concert with the patient's rehab needs make it unreasonable for this patient to be served in a less intensive setting? Yes 2. Co-Morbidities requiring supervision/potential complications: Uncontrolled hypertension 3. Due to bladder management,  bowel management, safety, skin/wound care, disease management, medication administration, pain management and patient education, does the patient require 24 hr/day rehab nursing? Yes 4. Does the patient require coordinated care of a physician, rehab nurse, PT (1-2 hrs/day, 5 days/week), OT (1-2 hrs/day, 5 days/week) and SLP (.5-1 hrs/day, 5 days/week) to address physical and functional deficits in the context of the above medical diagnosis(es)? Yes Addressing deficits in the following areas: balance, endurance, locomotion, strength, transferring, bowel/bladder control, bathing, dressing, feeding, grooming, toileting, cognition and psychosocial support 5. Can the patient actively participate in an intensive therapy program of at least 3 hrs of therapy 5 days a week? Yes  6. The potential for patient to make measurable gains while on inpatient rehab is excellent 7. Anticipated functional outcomes upon discharge from inpatients are: supervision PT, supervision OT, supervision SLP 8. Estimated rehab length of stay to reach the above functional goals is: 14-18d 9. Anticipated D/C setting: Home 10. Anticipated post D/C treatments: [place] therapy 11. Overall Rehab/Functional Prognosis: excellent  MD Signature: Charlett Blake M.D. [facility] FAAPM&R (Sports Med, Neuromuscular Med) Diplomate Am Board of Electrodiagnostic Med         Revision History

## 2018-06-05 NOTE — Progress Notes (Signed)
Inpatient Rehab Admissions Coordinator:   Pt with improved BP overnight, managing on PO medications.  I have approval from Dr. Pearlean Brownie for admission to inpatient rehab today.  I will alert RN, CM, and pt/family.   Estill Dooms, PT, DPT Admissions Coordinator [phone] 06/05/18  10:24 AM

## 2018-06-06 ENCOUNTER — Inpatient Hospital Stay (HOSPITAL_COMMUNITY): Payer: Medicare Other

## 2018-06-06 ENCOUNTER — Inpatient Hospital Stay (HOSPITAL_COMMUNITY): Payer: Medicare Other | Admitting: Occupational Therapy

## 2018-06-06 ENCOUNTER — Inpatient Hospital Stay (HOSPITAL_COMMUNITY): Payer: Medicare Other | Admitting: Speech Pathology

## 2018-06-06 DIAGNOSIS — I639 Cerebral infarction, unspecified: Secondary | ICD-10-CM

## 2018-06-06 DIAGNOSIS — E119 Type 2 diabetes mellitus without complications: Secondary | ICD-10-CM

## 2018-06-06 DIAGNOSIS — E871 Hypo-osmolality and hyponatremia: Secondary | ICD-10-CM

## 2018-06-06 DIAGNOSIS — I10 Essential (primary) hypertension: Secondary | ICD-10-CM

## 2018-06-06 DIAGNOSIS — F1011 Alcohol abuse, in remission: Secondary | ICD-10-CM

## 2018-06-06 LAB — CBC WITH DIFFERENTIAL/PLATELET
Abs Immature Granulocytes: 0.02 10*3/uL (ref 0.00–0.07)
Basophils Absolute: 0 10*3/uL (ref 0.0–0.1)
Basophils Relative: 1 %
Eosinophils Absolute: 0.2 10*3/uL (ref 0.0–0.5)
Eosinophils Relative: 3 %
HCT: 42.4 % (ref 39.0–52.0)
Hemoglobin: 14.7 g/dL (ref 13.0–17.0)
Immature Granulocytes: 0 %
Lymphocytes Relative: 33 %
Lymphs Abs: 2.6 10*3/uL (ref 0.7–4.0)
MCH: 28.1 pg (ref 26.0–34.0)
MCHC: 34.7 g/dL (ref 30.0–36.0)
MCV: 80.9 fL (ref 80.0–100.0)
Monocytes Absolute: 0.9 10*3/uL (ref 0.1–1.0)
Monocytes Relative: 12 %
Neutro Abs: 4 10*3/uL (ref 1.7–7.7)
Neutrophils Relative %: 51 %
Platelets: 363 10*3/uL (ref 150–400)
RBC: 5.24 MIL/uL (ref 4.22–5.81)
RDW: 12.6 % (ref 11.5–15.5)
WBC: 7.8 10*3/uL (ref 4.0–10.5)
nRBC: 0 % (ref 0.0–0.2)

## 2018-06-06 LAB — COMPREHENSIVE METABOLIC PANEL
ALT: 40 U/L (ref 0–44)
AST: 31 U/L (ref 15–41)
Albumin: 3.8 g/dL (ref 3.5–5.0)
Alkaline Phosphatase: 60 U/L (ref 38–126)
Anion gap: 9 (ref 5–15)
BUN: 14 mg/dL (ref 8–23)
CO2: 25 mmol/L (ref 22–32)
Calcium: 9.6 mg/dL (ref 8.9–10.3)
Chloride: 100 mmol/L (ref 98–111)
Creatinine, Ser: 1.07 mg/dL (ref 0.61–1.24)
GFR calc Af Amer: >60 mL/min (ref >60)
GFR calc non Af Amer: >60 mL/min (ref >60)
Glucose, Bld: 157 mg/dL — ABNORMAL HIGH (ref 70–99)
Potassium: 4.1 mmol/L (ref 3.5–5.1)
Sodium: 134 mmol/L — ABNORMAL LOW (ref 135–145)
Total Bilirubin: 2 mg/dL — ABNORMAL HIGH (ref 0.3–1.2)
Total Protein: 6.9 g/dL (ref 6.5–8.1)

## 2018-06-06 LAB — GLUCOSE, CAPILLARY
Glucose-Capillary: 161 mg/dL — ABNORMAL HIGH (ref 70–99)
Glucose-Capillary: 167 mg/dL — ABNORMAL HIGH (ref 70–99)
Glucose-Capillary: 129 mg/dL — ABNORMAL HIGH (ref 70–99)
Glucose-Capillary: 175 mg/dL — ABNORMAL HIGH (ref 70–99)

## 2018-06-06 NOTE — Care Management Note (Signed)
Inpatient Rehabilitation Center Individual Statement of Services  Patient Name:  Joe Wood  Date:  06/06/2018  Welcome to the Inpatient Rehabilitation Center.  Our goal is to provide you with an individualized program based on your diagnosis and situation, designed to meet your specific needs.  With this comprehensive rehabilitation program, you will be expected to participate in at least 3 hours of rehabilitation therapies Monday-Friday, with modified therapy programming on the weekends.  Your rehabilitation program will include the following services:  Physical Therapy (PT), Occupational Therapy (OT), Speech Therapy (ST), 24 hour per day rehabilitation nursing, Neuropsychology, Case Management (Social Worker), Rehabilitation Medicine, Nutrition Services and Pharmacy Services  Weekly team conferences will be held on Wednesday to discuss your progress.  Your Social Worker will talk with you frequently to get your input and to update you on team discussions.  Team conferences with you and your family in attendance may also be held.  Expected length of stay: 21-23 days  Overall anticipated outcome: supervision-min assist level  Depending on your progress and recovery, your program may change. Your Social Worker will coordinate services and will keep you informed of any changes. Your Social Worker's name and contact numbers are listed  below.  The following services may also be recommended but are not provided by the Inpatient Rehabilitation Center:   Driving Evaluations  Home Health Rehabiltiation Services  Outpatient Rehabilitation Services    Arrangements will be made to provide these services after discharge if needed.  Arrangements include referral to agencies that provide these services.  Your insurance has been verified to be:  [employer]-Medicare Your primary doctor is:  [facility] but no PCP  Pertinent information will be shared with your doctor and your insurance  company.  Social Worker:  Dossie Der, SW [phone] or [phone]  Information discussed with and copy given to patient by: Lucy Chris, 06/06/2018, 11:38 AM

## 2018-06-06 NOTE — Progress Notes (Signed)
[place] PHYSICAL MEDICINE & REHABILITATION PROGRESS NOTE  Subjective/Complaints: Patient seen sitting up in bed this morning eating breakfast.  He states he slept well overnight.  States he is ready for therapies to begin.    ROS: Denies CP, shortness of breath, nausea, vomiting, diarrhea.  Objective: Vital Signs: Blood pressure (!) 153/92, pulse 69, temperature (!) 97.5 F (36.4 C), temperature source Oral, resp. rate 17, height 5\' 8"  (1.727 m), weight 80.8 kg, SpO2 98 %. No results found. Recent Labs    06/05/18 0500 06/06/18 0247  WBC 6.5 7.8  HGB 14.1 14.7  HCT 40.7 42.4  PLT 319 363   Recent Labs    06/05/18 0500 06/06/18 0247  NA 136 134*  K 3.5 4.1  CL 100 100  CO2 23 25  GLUCOSE 162* 157*  BUN 13 14  CREATININE 1.00 1.07  CALCIUM 9.8 9.6    Physical Exam: BP (!) 153/92 (BP Location: Right Arm)   Pulse 69   Temp (!) 97.5 F (36.4 C) (Oral)   Resp 17   Ht 5\' 8"  (1.727 m)   Wt 80.8 kg   SpO2 98%   BMI 27.08 kg/m  Constitutional: No distress . Vital signs reviewed. HENT: Normocephalic.  Atraumatic. Eyes: EOMI. No discharge. Cardiovascular: No JVD. Respiratory: Normal effort. GI: Non-distended. Musc: No edema or tenderness in extremities. Neurological: Alert and oriented Makes eye contact with examiner.  Dysarthria Facial droop Follows commands Motor: LUE/LLE: 5/5 proximal distal RUE: Shoulder abduction 2+/5, distally 3+/5 with apraxia Right lower extremity: Hip flexion, knee extension 2/5, ankle dorsiflexion 0/5  Sensation intact light touch Skin: Warm and dry.  Intact. Psych: Normal mood.  Normal behavior.  Assessment/Plan: 1. Functional deficits secondary to bilateral brain infarcts which require 3+ hours per day of interdisciplinary therapy in a comprehensive inpatient rehab setting.  Physiatrist is providing close team supervision and 24 hour management of active medical problems listed below.  Physiatrist and rehab team continue to  assess barriers to discharge/monitor patient progress toward functional and medical goals  Care Tool:  Bathing    Body parts bathed by patient: Chest, Abdomen, Front perineal area, Right upper leg, Left upper leg, Face, Right arm   Body parts bathed by helper: Left lower leg, Right lower leg, Buttocks, Left arm     Bathing assist Assist Level: Moderate Assistance - Patient 50 - 74%     Upper Body Dressing/Undressing Upper body dressing Upper body dressing/undressing activity did not occur (including orthotics): N/A What is the patient wearing?: Pull over shirt    Upper body assist Assist Level: Maximal Assistance - Patient 25 - 49%    Lower Body Dressing/Undressing Lower body dressing      What is the patient wearing?: Pants, Incontinence brief     Lower body assist Assist for lower body dressing: Maximal Assistance - Patient 25 - 49%     Toileting Toileting    Toileting assist Assist for toileting: Maximal Assistance - Patient 25 - 49%     Transfers Chair/bed transfer  Transfers assist     Chair/bed transfer assist level: Maximal Assistance - Patient 25 - 49% Chair/bed transfer assistive device: Mechanical lift(stedy)   Locomotion Ambulation   Ambulation assist              Walk 10 feet activity   Assist           Walk 50 feet activity   Assist           Walk  150 feet activity   Assist           Walk 10 feet on uneven surface  activity   Assist           Wheelchair     Assist               Wheelchair 50 feet with 2 turns activity    Assist            Wheelchair 150 feet activity     Assist               Medical Problem List and Plan: 1.Right side weakness facial droop with dysarthriasecondary to bilateral punctate infarcts with largest infarct in left leftBG/CR on 05/29/2018. Status post TPA. 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  Begin CIR  evaluations  Notes reviewed- stroke with?  History of non-compliance, images reviewed-bilateral punctate infarcts with largest infarct in left basal ganglia.,  Labs reviewed 2. Antithrombotics: -DVT/anticoagulation:Subcutaneous Lovenox -antiplatelet therapy: Aspirin 81 mg daily, Plavix 75 mg daily x3 months then Plavix alone   CBC within normal limits on 5/15 3. Pain Management:Tylenol as needed 4. Mood:Provide emotional support -antipsychotic agents: N/A 5. Neuropsych: This patientiscapable of making decisions on hisown behalf. 6. Skin/Wound Care:Routine skin checks 7. Fluids/Electrolytes/Nutrition:Routine in and outs  8.Hypertension with questionable medical noncompliance. HCTZ 12.5 mg dailyLopressor 25 mg twice daily, Norvasc 10 mg daily.   Monitor with increased mobility 9.Hyperlipidemia. Lipitor 10.History of alcohol use.  Counseling 11.New findings type 2 diabetes mellitus.Hemoglobin A1c 6.5. SSI. Diabetic teaching  Monitor with increased mobility 12.  Hyponatremia  Sodium 134 on 5/15  Continue to monitor  LOS: 1 days A FACE TO FACE EVALUATION WAS PERFORMED  Ankit Karis Jubanil Patel 06/06/2018, 10:23 AM

## 2018-06-06 NOTE — Progress Notes (Signed)
Inpatient Rehabilitation  Patient information reviewed and entered into eRehab system by Mildreth Reek M. Marquite Attwood, M.A., CCC/SLP, PPS Coordinator.  Information including medical coding, functional ability and quality indicators will be reviewed and updated through discharge.    During the Covid 19 public health emergency, the inpatient rehabilitation unit of the [facility] will use acute care beds on another unit to provide each patient with a private room. This effort is to assure proper infection control and to optimize care management during this public health emergency.     

## 2018-06-06 NOTE — Progress Notes (Signed)
Social Work  Social Work Assessment and Plan  Patient Details  Name: Joe Wood MRN: [id number] Date of Birth: 09/05/1950  Today's Date: 06/06/2018  Problem List:  Patient Active Problem List   Diagnosis Date Noted  . Cerebellar stroke, acute (HCC)   . Acute cerebral infarction (HCC)   . Hyponatremia   . Diabetes mellitus, new onset (HCC)   . Essential hypertension   . History of alcohol abuse   . Basal ganglia stroke (HCC) 06/05/2018  . 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 Hypertensive emergency 06/04/2018  . Hyperlipidemia LDL goal <70 06/04/2018  . Diabetes mellitus, type II (HCC) 06/04/2018  . Alcohol abuse 06/04/2018  . Acute ischemic stroke [facility](HCC) - s/p tPA, d/t large vessel disease 05/29/2018   Past Medical History:  Past Medical History:  Diagnosis Date  . Hypertension    Past Surgical History:  Past Surgical History:  Procedure Laterality Date  . IR ANGIO INTRA EXTRACRAN SEL COM CAROTID INNOMINATE BILAT MOD SED  06/02/2018  . IR ANGIO VERTEBRAL SEL SUBCLAVIAN INNOMINATE UNI R MOD SED  06/02/2018  . IR ANGIOGRAM EXTREMITY LEFT  06/02/2018   Social History:  reports that he has never smoked. He has never used smokeless tobacco. He reports current alcohol use. He reports that he does not use drugs.  Family / Support Systems Marital Status: Married Patient Roles: Spouse, Parent, Other (Comment)(retiree) Spouse/Significant Other: Joe Wood [phone] Children: Son stays at home when not working out fo town Other Supports: Second local son who comes by and will help if needed. Anticipated Caregiver: Wife and son Ability/Limitations of Caregiver: Min assist-wife feels she can assist in good health Caregiver Availability: 24/7 Family Dynamics: Close knit who is there for another. They have friends and church members who are supportive and will come by and assist if needed.  Social History Preferred language: English Religion:  Cultural Background: No  issues Education: HS Read: Yes Write: Yes Employment Status: Retired Marine scientistLegal History/Current Legal Issues: No issues Guardian/Conservator: None-according to MD pt is capable of making his own decisions while here   Abuse/Neglect Abuse/Neglect Assessment Can Be Completed: Yes Physical Abuse: Denies Verbal Abuse: Denies Sexual Abuse: Denies Exploitation of patient/patient's resources: Denies Self-Neglect: Denies  Emotional Status Pt's affect, behavior and adjustment status: Pt is motivated to improve and regain his independence. He is one who does not ask for assist and wants to do for himself. Wife reports he is stubborn and does what he wants. She has been trying to get him to go the the MD before this. Recent Psychosocial Issues: other health issues-ques non-compliance with meds Psychiatric History: No history deferred depression screen due to new to unit and adjusting but will ask neuro-psych to see while here for coping. Substance Abuse History: No issues  Patient / Family Perceptions, Expectations & Goals Pt/Family understanding of illness & functional limitations: Pt and wife can explain his stroke and deficits, both have spoken with the MD and feel they have a good understanding of his deficits and treatment plan going forward. Pt realizes the importance of going to the MD and taking his meds now. Premorbid pt/family roles/activities: Husband, father, retiree, church member, friend, etc Anticipated changes in roles/activities/participation: resume Pt/family expectations/goals: Pt states: " I want to do for myself not make my wife or son."  Wife states: " I hope he does well there but will help him if he lets me."  [employer] IncCommunity Resources Community Agencies: None Premorbid Home Care/DME Agencies: None Transportation available at discharge:  Family Resource referrals recommended: Neuropsychology, Support group (specify)  Discharge Planning Living Arrangements: Spouse/significant  other, Children Support Systems: Spouse/significant other, Children, Other relatives, Friends/neighbors, Church/faith community Type of Residence: Private residence Insurance Resources: Media planner (specify)([employer]-Medicare) Financial Resources: Social Security, Family Support Financial Screen Referred: No Living Expenses: Own Money Management: Patient, Spouse Does the patient have any problems obtaining your medications?: Yes (Describe)(was not seeing a PCP and at times not taking his meds) Home Management: Wife, pt does outside work Associate Professor Plans: Return home with wife and son when he is there and not out of town for work. Carlena Bjornstad is in good health and can assist and will need education prior to DC from rehab. Will await teams' evaluations and work on discharge needs. Social Work Anticipated Follow Up Needs: HH/OP, Support Group  Clinical Impression Pleasant gentleman who is motivated but has deficits form his stroke. His wife and son will assist but know how stubborn he is and wanting to do for himself. He will need a PCP prior to leaving and would benefit from seeing neruo-psych while here. Will await team evaluations and work on discharge needs.  Lucy Chris 06/06/2018, 2:28 PM

## 2018-06-06 NOTE — Progress Notes (Signed)
Occupational Therapy Session Note  Patient Details  Name: Joe Wood MRN: [id number] Date of Birth: [birth year]  Today's Date: 06/06/2018 OT Individual Time: 5397-6734 OT Individual Time Calculation (min): 30 min    Short Term Goals: Week 1:  OT Short Term Goal 1 (Week 1): Pt will be able to sit safely on tub bench with S to be able to bathe LB with mod A. OT Short Term Goal 2 (Week 1): Pt will be able to transfer to toilet with mod A of 1.  OT Short Term Goal 3 (Week 1): Pt will demonstrate improved R side awareness to don shirt over R arm with min cues.  OT Short Term Goal 4 (Week 1): Pt will demonstrate improved motor planning to don shirt with min A and min cues.  OT Short Term Goal 5 (Week 1): Pt will be able to sit to stand for clothing management with mod A.   Skilled Therapeutic Interventions/Progress Updates:  Treatment session with focus on functional transfers and RUE NMR.  Pt received upright in w/c agreeable to therapy session.  Completed toilet transfers from w/c to [facility] over toilet.  Pt able to reach towards Rt armrest with RUE to facilitate weight shift during transfer to [facility] with mod-max assist.  Therapist facilitating weight shift during transfer, however much improved with RUE placement.  Max assist when transferring back to Lt.  Engaged in RUE NMR in sitting with focus on AAROM and self-ROM.  Engaged in towel glides in sitting with focus on shoulder flexion and horizontal abduction/adduction.  Attempted grasp with hand over hand from therapist.  Pt able to grasp cup but unable to bring it to mouth or manipulate cup to simulate pouring.  Returned to room and left upright in w/c with seat belt alarm on and all needs in reach.  Encouraged pt to continue towel glides in chair.  Therapy Documentation Precautions:  Precautions Precautions: Fall Precaution Comments: R lean and decreased R side awareness Restrictions Weight Bearing Restrictions: No General:   Vital  Signs: Therapy Vitals Temp: 98.3 F (36.8 C) Temp Source: Oral Pulse Rate: 73 BP: (!) 151/83 Patient Position (if appropriate): Sitting Oxygen Therapy SpO2: 100 % O2 Device: Room Air Pain: Pain Assessment Pain Score: 0-No pain   Therapy/Group: Individual Therapy  Rosalio Loud 06/06/2018, 3:39 PM

## 2018-06-06 NOTE — Evaluation (Signed)
Occupational Therapy Assessment and Plan  Patient Details  Name: CASON LUFFMAN MRN: [id number] Date of Birth: 03-21-1950  OT Diagnosis: abnormal posture, apraxia, cognitive deficits and hemiplegia affecting dominant side Rehab Potential: Rehab Potential (ACUTE ONLY): Good ELOS: 21-23 days   Today's Date: 06/06/2018 OT Individual Time: 8250-5397 OT Individual Time Calculation (min): 60 min     Problem List:  Patient Active Problem List   Diagnosis Date Noted  . Cerebellar stroke, acute ([place])   . Acute cerebral infarction ([place])   . Hyponatremia   . Diabetes mellitus, new onset ([place])   . Essential hypertension   . History of alcohol abuse   . Basal ganglia stroke ([place]) 06/05/2018  . 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 Hypertensive emergency 06/04/2018  . Hyperlipidemia LDL goal <70 06/04/2018  . Diabetes mellitus, type II ([place]) 06/04/2018  . Alcohol abuse 06/04/2018  . Acute ischemic stroke [facility]) - s/p tPA, d/t large vessel disease 05/29/2018    Past Medical History:  Past Medical History:  Diagnosis Date  . Hypertension    Past Surgical History:  Past Surgical History:  Procedure Laterality Date  . IR ANGIO INTRA EXTRACRAN SEL COM CAROTID INNOMINATE BILAT MOD SED  06/02/2018  . IR ANGIO VERTEBRAL SEL SUBCLAVIAN INNOMINATE UNI R MOD SED  06/02/2018  . IR ANGIOGRAM EXTREMITY LEFT  06/02/2018    Assessment & Plan Clinical Impression:   HPI: Pascal Stiggers is a 68 year old right-handed male with history of hypertension questionable medical compliance as well as alcohol use.  Per chart review lives with spouse.  Independent prior to admission.  One level home 2 steps to entry.  He is retired.  Presented 05/29/2018 with right-sided weakness facial droop and slurred speech.  Systolic blood pressure in the 240s.  Placed on Cleviprex drip.  Cranial CT scan showed age indeterminate lacunar infarct left thalamus.  Negative for hemorrhage.  CT angiogram of head and  neck no emergent large vessel occlusion.  Patient did receive TPA.  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  Echocardiogram with ejection fraction of 67% normal systolic function.    Cerebral angiogram 06/02/2018 per interventional radiology showed severe preocclusive mid basilar artery stenosis with no plan for intervention.  Neurology follow-up maintained on aspirin and Plavix x3 months then Plavix alone.  Subcutaneous Lovenox for DVT prophylaxis.  New findings of elevated hemoglobin A1c of 6.5.  Sliding scale insulin with diabetic teaching.  Tolerating a regular diet.  Therapy evaluations completed and patient was admitted for a comprehensive rehab program   Patient transferred to CIR on 06/05/2018 .    Patient currently requires max with basic self-care skills secondary to decreased cardiorespiratoy endurance, abnormal tone, unbalanced muscle activation, motor apraxia and decreased coordination, decreased midline orientation and decreased attention to right, decreased awareness, decreased problem solving, decreased memory and delayed processing and decreased sitting balance, decreased standing balance, decreased postural control, hemiplegia and decreased balance strategies.  Prior to hospitalization, patient was fully independent.  Patient will benefit from skilled intervention to increase independence with basic self-care skills prior to discharge home with care partner.  Anticipate patient will require minimal physical assistance and follow up home health.  OT - End of Session Activity Tolerance: Tolerates 10 - 20 min activity with multiple rests Endurance Deficit: Yes Endurance Deficit Description: very fatigued at end of session OT Assessment Rehab Potential (ACUTE  ONLY): Good OT Patient demonstrates impairments in the following area(s):  Balance;Cognition;Endurance;Motor;Perception OT Basic ADL's Functional Problem(s): Eating;Grooming;Bathing;Dressing;Toileting OT Transfers Functional Problem(s): Toilet;Tub/Shower OT Additional Impairment(s): Fuctional Use of Upper Extremity OT Plan OT Intensity: Minimum of 1-2 x/day, 45 to 90 minutes OT Frequency: 5 out of 7 days OT Duration/Estimated Length of Stay: 21-23 days OT Treatment/Interventions: Balance/vestibular training;Cognitive remediation/compensation;Discharge planning;DME/adaptive equipment instruction;Functional electrical stimulation;Functional mobility training;Psychosocial support;Patient/family education;Neuromuscular re-education;Pain management;Self Care/advanced ADL retraining;Therapeutic Activities;Therapeutic Exercise;UE/LE Strength taining/ROM;UE/LE Coordination activities;Visual/perceptual remediation/compensation OT Self Feeding Anticipated Outcome(s): I OT Basic Self-Care Anticipated Outcome(s): s/u UB tasks, min A LB tasks OT Toileting Anticipated Outcome(s): min A OT Bathroom Transfers Anticipated Outcome(s): min A OT Recommendation Patient destination: Home Follow Up Recommendations: Home health OT Equipment Recommended: Tub/shower bench   Skilled Therapeutic Intervention Pt seen for initial evaluation and ADL training with a focus on R side awareness and postural control.  Discussed role of OT, pt's goals, estimated LOS.  Pt worked quite a bit on this session on bed mobility, toilet and tub bench transfers, shower, dressing.  Overall he is requiring max A as he has impaired postural control with a R lean, decreased R side awareness and AROM, delayed processing and memory.  With extra time and cues pt was able to follow basic directions.  At end of session, needed +2 A from nurse tech to assist with pulling pants over hips as therapist brought pt to stand.  Pt resting in w/c and speech therapy arrived for his next session.   OT  Evaluation Precautions/Restrictions  Precautions Precautions: Fall Precaution Comments: R lean and decreased R side awareness  Pain Pain Assessment Pain Scale: 0-10 Pain Score: 0-No pain Home Living/Prior Functioning Home Living Family/patient expects to be discharged to:: Private residence Living Arrangements: Spouse/significant other Available Help at Discharge: Family, Available 24 hours/day Type of Home: House Home Access: Stairs to enter [employer] of Steps: 2 Home Layout: One level Bathroom Shower/Tub: Public librarian, Door [employer]: Standard  Lives With: Spouse Prior Function Level of Independence: Independent with basic ADLs, Independent with homemaking with ambulation, Independent with gait, Independent with transfers  Able to Take Stairs?: Yes Driving: No(pt can drive but his car is not working) Public house manager: 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 ADL ADL Eating: Set up Grooming: Setup Upper Body Bathing: Minimal assistance Where Assessed-Upper Body Bathing: Retail buyer Bathing: Maximal assistance Where Assessed-Lower Body Bathing: Shower Upper Body Dressing: Maximal assistance Where Assessed-Upper Body Dressing: Wheelchair Lower Body Dressing: Dependent Where Assessed-Lower Body Dressing: Wheelchair Toileting: Maximal assistance Where Assessed-Toileting: Glass blower/designer: Maximal verbal cueing, Maximal assistance Toilet Transfer Method: Engineer, water: Energy manager: Maximal Firefighter Method: Education officer, environmental: Radio broadcast assistant, Grab bars Vision Baseline Vision/History: Wears glasses Wears Glasses: Reading only Patient Visual Report: No change from baseline Vision Assessment?: Yes Eye Alignment: Within Functional Limits Ocular Range of Motion: Within Functional Limits Alignment/Gaze Preference: Within  Defined Limits Tracking/Visual Pursuits: Able to track stimulus in all quads without difficulty Saccades: Within functional limits Visual Fields: No apparent deficits Perception  Perception: Impaired Inattention/Neglect: Does not attend to right visual field;Does not attend to right side of body Praxis Praxis: Impaired Praxis Impairment Details: Motor planning;Ideomotor Cognition Overall Cognitive Status: No family/caregiver present to determine baseline cognitive functioning Arousal/Alertness: Awake/alert Orientation Level: Person;Place;Situation Person: Oriented Place: Oriented Situation: Oriented Year: 2020(initially stated 1920, but self corrected) Month: April Day of Week: Incorrect Memory: Impaired Memory  Impairment: Retrieval deficit;Decreased recall of new information Immediate Memory Recall: Sock;Blue;Bed Memory Recall: Sock;Blue;Bed Memory Recall Sock: With Cue Memory Recall Blue: With Cue Memory Recall Bed: (unable to recall) Attention: Focused;Sustained Focused Attention: Appears intact Sustained Attention: Appears intact Awareness: Impaired(decreased awareness of R side and postural control) Awareness Impairment: Emergent impairment;Intellectual impairment Problem Solving: Impaired(Impaired for complex probelm solving) Problem Solving Impairment: Functional complex Safety/Judgment: Appears intact Sensation Sensation Light Touch: Appears Intact Hot/Cold: Appears Intact Proprioception: Impaired by gross assessment Stereognosis: Impaired by gross assessment Coordination Gross Motor Movements are Fluid and Coordinated: No Fine Motor Movements are Fluid and Coordinated: No Coordination and Movement Description: pt has limited AROM of RUE/LE which impedes coordination Finger Nose Finger Test: unable to bring finger to nose Motor  Motor Motor: Hemiplegia;Motor apraxia Mobility    max A sit to stand, standing, squat pivot Trunk/Postural Assessment  Cervical  Assessment Cervical Assessment: Within Functional Limits Thoracic Assessment Thoracic Assessment: Within Functional Limits Lumbar Assessment Lumbar Assessment: Within Functional Limits Postural Control Postural Control: Deficits on evaluation Trunk Control: R lean in sitting and standing Righting Reactions: mod impaired Protective Responses: mod impaired  Balance Static Sitting Balance Static Sitting - Level of Assistance: 4: Min assist Dynamic Sitting Balance Dynamic Sitting - Level of Assistance: 2: Max assist Static Standing Balance Static Standing - Level of Assistance: 1: +1 Total assist Extremity/Trunk Assessment RUE Assessment RUE Assessment: Exceptions to [facility] Passive Range of Motion (PROM) Comments: shoulder flexion to 130 degrees, WFL distally Active Range of Motion (AROM) Comments: sh flex 20, elbow flex 40, 50% ROM for finger flex/ ext General Strength Comments: 2-/5 RUE Body System: Neuro Brunstrum levels for arm and hand: Arm;Hand Brunstrum level for arm: Stage II Synergy is developing Brunstrum level for hand: Stage V Independence from basic synergies RUE Tone RUE Tone: Moderate;Hypertonic Hypertonic Details: shoulder and elbow LUE Assessment LUE Assessment: Within Functional Limits     Refer to Care Plan for Long Term Goals  Recommendations for other services: None    Discharge Criteria: Patient will be discharged from OT if patient refuses treatment 3 consecutive times without medical reason, if treatment goals not met, if there is a change in medical status, if patient makes no progress towards goals or if patient is discharged from hospital.  The above assessment, treatment plan, treatment alternatives and goals were discussed and mutually agreed upon: by patient  [facility] 06/06/2018, 12:42 PM

## 2018-06-06 NOTE — Progress Notes (Signed)
Late entry for missed Modified Ranking Scoring.  Score based on review of MEDICAL RECORD NUMBER   06/05/18 1600  Modified Rankin (Stroke Patients Only)  Pre-Morbid Rankin Score 0  Modified Rankin 5   [place], [place]   Acute Rehabilitation Services  Pager [phone] Office [phone] 06/06/2018

## 2018-06-06 NOTE — Evaluation (Signed)
Physical Therapy Assessment and Plan  Patient Details  Name: Joe Wood MRN: [id number] Date of Birth: [birth year]  PT Diagnosis: Abnormality of gait, Hemiparesis dominant, Impaired cognition and Muscle weakness Rehab Potential: Good ELOS: 3 weeks   Today's Date: 06/06/2018 PT Individual Time: 1500-1600 PT Individual Time Calculation (min): 60 min    Problem List:  Patient Active Problem List   Diagnosis Date Noted  . Cerebellar stroke, acute ([place])   . Acute cerebral infarction ([place])   . Hyponatremia   . Diabetes mellitus, new onset ([place])   . Essential hypertension   . History of alcohol abuse   . Basal ganglia stroke ([place]) 06/05/2018  . 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 Hypertensive emergency 06/04/2018  . Hyperlipidemia LDL goal <70 06/04/2018  . Diabetes mellitus, type II ([place]) 06/04/2018  . Alcohol abuse 06/04/2018  . Acute ischemic stroke [facility]) - s/p tPA, d/t large vessel disease 05/29/2018    Past Medical History:  Past Medical History:  Diagnosis Date  . Hypertension    Past Surgical History:  Past Surgical History:  Procedure Laterality Date  . IR ANGIO INTRA EXTRACRAN SEL COM CAROTID INNOMINATE BILAT MOD SED  06/02/2018  . IR ANGIO VERTEBRAL SEL SUBCLAVIAN INNOMINATE UNI R MOD SED  06/02/2018  . IR ANGIOGRAM EXTREMITY LEFT  06/02/2018    Assessment & Plan Clinical Impression: Joe Wood a 68 year old right-handed male with history of hypertension questionable medical compliance as well as alcohol use. Per chart review lives with spouse. Independent prior to admission. One level home 2 steps to entry. He is retired. Presented 05/29/2018 with right-sided weakness facial droop and slurred speech. Systolic blood pressure in the 240s. Placed on Cleviprex drip. Cranial CT scan showed age indeterminate lacunar infarct left thalamus. Negative for hemorrhage. CT angiogram of head and neck no emergent large vessel occlusion. Patient  did receive TPA. 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 Echocardiogram with ejection fraction of 17% normal systolic function. Cerebral angiogram 06/02/2018 per interventional radiology showed severe preocclusive mid basilar artery stenosis with no plan for intervention. Neurology follow-up maintained on aspirin and Plavix x3 months then Plavix alone.Subcutaneous Lovenox for DVT prophylaxis. New findings of elevated hemoglobin A1c of 6.5. Sliding scale insulin with diabetic teaching. Tolerating a regular diet. Therapy evaluations completed and patient was admitted for a comprehensive rehab program Patient transferred to CIR on 06/05/2018 .   Patient currently requires max with mobility secondary to muscle weakness, impaired timing and sequencing, motor apraxia and ataxia, decreased midline orientation and decreased attention to right and decreased awareness, decreased safety awareness and decreased memory.  Prior to hospitalization, patient was independent  with mobility and lived with Spouse in a House home.  Home access is 3Stairs to enter.  Patient will benefit from skilled PT intervention to maximize safe functional mobility, minimize fall risk and decrease caregiver burden for planned discharge home with 24 hour assist.  Anticipate patient will benefit from follow up [facility] at discharge.  PT - End of Session Activity Tolerance: Improving;Decreased this session;Tolerates 30+ min activity with multiple rests Endurance Deficit: Yes Endurance Deficit Description: fatigues with standing activity needs multiple seated rest breaks PT Assessment Rehab Potential (ACUTE/IP ONLY): Good PT Patient demonstrates impairments in the following area(s): Balance;Perception;Endurance;Motor;Safety PT Transfers Functional Problem(s): Bed Mobility;Bed to Chair;Car;Furniture PT  Locomotion Functional Problem(s): Ambulation;Wheelchair Mobility;Stairs PT Plan PT Intensity: Minimum of 1-2  x/day ,45 to 90 minutes PT Frequency: 5 out of 7 days PT Duration Estimated Length of Stay: 3 weeks PT Treatment/Interventions: Ambulation/gait training;DME/adaptive equipment instruction;Neuromuscular re-education;Stair training;UE/LE Strength taining/ROM;Wheelchair propulsion/positioning;Balance/vestibular training;Cognitive remediation/compensation;Functional mobility training;Patient/family education;Therapeutic Exercise;UE/LE Coordination activities;Therapeutic Activities PT Transfers Anticipated Outcome(s): S PT Locomotion Anticipated Outcome(s): CGA for gait, S w/c PT Recommendation Follow Up Recommendations: Home health PT;24 hour supervision/assistance Patient destination: Home Equipment Recommended: To be determined Equipment Details: likely w/c and RW  Skilled Therapeutic Intervention Patient in w/c in room agreeable to PT.  Recalls info about home set up and prior functional level.  Patient seated for strength and sensation testing.  Assisted in w/c to hallway and mod cues for w/c mobility with hemi technique and pt performed with L UE/LE with occasional min A and continuing min cues x about 50'.  Assisted to therapy gym.  Performed squat pivot transfer to L with mod A and cues after assist for w/c set up.  Patient seated for balance testing.  Sit to supine on mat with S demonstrating synergy on R side to lift leg.  In supine rolling to R with S and L min A for R UE.  Patient side to sit mod A.  Sit to stand to RW mod A higher surface.  Gait about 5' then turn and walked back to mat max A for walker management, occasionally to progress R LE, for balance especially in R stance and for L lateral weight shift max cues for sequencing and technique and to keep R grip on walker.  Patient transferred to w/c on R with max A cues for using R side to assist.  Patient in hallway sit to stand  to wall rail on L.  Gait x 13' with mod to max A for R LE stability in stance to facilitate hip extension and slight knee flexion and COG over BOS.  Patient seated in chair and assisted to stand to transfer to w/c.  Propelled w/c about 21' back to room with hemi technique, occasional min a and cues.  Transfer to bed via squat pivot with bed rail and mod A.  Sit to supine min A for R LE and cues for positioning.  Patient left in supine with call bell in reach and bed alarm activated.    PT Evaluation Precautions/Restrictions Precautions Precautions: Fall Precaution Comments: R lean and decreased R side awareness Pain Pain Assessment Pain Score: 0-No pain Home Living/Prior Functioning Home Living Living Arrangements: Spouse/significant other;Children Available Help at Discharge: Family;Available 24 hours/day Type of Home: House Home Access: Stairs to enter [employer] of Steps: 3 Entrance Stairs-Rails: Can reach both Home Layout: One level Bathroom Shower/Tub: Chiropodist: Standard  Lives With: Spouse Prior Function Level of Independence: Independent with basic ADLs;Independent with homemaking with ambulation;Independent with gait;Independent with transfers  Able to Take Stairs?: Yes Driving: No 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 Vision/Perception  Geologist, engineering: Impaired Inattention/Neglect: Does not attend to right Garment/textile technologist;Does not attend to right side of body Praxis Praxis: Impaired Praxis Impairment Details: Motor planning;Ideomotor  Cognition Overall Cognitive Status: No family/caregiver present to determine baseline cognitive functioning Arousal/Alertness: Awake/alert Orientation Level: Oriented X4 Attention: Sustained Focused Attention: Appears intact Sustained Attention: Appears intact Memory: Impaired Awareness: Impaired(decreased R side awareness) Sensation Sensation Light  Touch: Appears Intact Hot/Cold: Not tested Proprioception: Not tested Stereognosis: Not tested Coordination Gross Motor Movements are Fluid and Coordinated: No Coordination and Movement Description: Decresaed coordination of LE UE/LE sequencing/timing,  placement Motor  Motor Motor: Hemiplegia;Abnormal postural alignment and control;Motor apraxia;Ataxia Motor - Skilled Clinical Observations: R side weakness, impaired motor planning/ sequencing/timing/placement  Mobility Bed Mobility Bed Mobility: Rolling Right;Rolling Left;Supine to Sit;Sit to Supine Rolling Right: Supervision/verbal cueing Rolling Left: Minimal Assistance - Patient > 75% Supine to Sit: Moderate Assistance - Patient 50-74% Sit to Supine: Supervision/Verbal cueing Transfers Transfers: Sit to Stand;Stand to Sit;Squat Pivot Transfers Sit to Stand: Maximal Assistance - Patient 25-49% Stand to Sit: Moderate Assistance - Patient 50-74% Squat Pivot Transfers: Moderate Assistance - Patient 50-74% Transfer (Assistive device): Rolling walker Locomotion  Gait Ambulation: Yes Gait Assistance: Moderate Assistance - Patient 50-74%;Maximal Assistance - Patient 25-49% Gait Distance (Feet): 13 Feet Assistive device: Rolling walker;Other (Comment)(hallway rail) Gait Assistance Details: Verbal cues for safe use of DME/AE;Verbal cues for precautions/safety;Verbal cues for sequencing;Manual facilitation for weight shifting;Manual facilitation for placement Gait Assistance Details: A for R side hip extension, knee slight flexion in stance, assist for L weight shift for better R foot progression in swing, A for walker management, cues to keep R hand gripping walker Gait Gait: Yes Gait Pattern: Decreased dorsiflexion - right;Step-to pattern;Right genu recurvatum;Scissoring;Ataxic;Shuffle;Decreased hip/knee flexion - right;Decreased step length - left;Lateral trunk lean to right;Poor foot clearance - right Stairs / Additional  Locomotion Stairs: Yes Stairs Assistance: Moderate Assistance - Patient 50 - 74%;Maximal Assistance - Patient 25 - 49% Stair Management Technique: Two rails;Forwards;Backwards Number of Stairs: 1 Height of Stairs: 4 Wheelchair Mobility Wheelchair Mobility: Yes Wheelchair Assistance: Minimal assistance - Patient >75% Wheelchair Propulsion: Left upper extremity;Left lower extremity Wheelchair Parts Management: Needs assistance Distance: 24'  Trunk/Postural Assessment  Cervical Assessment Cervical Assessment: Exceptions to WFL(lateral head tilt to L & forward head) Thoracic Assessment Thoracic Assessment: Exceptions to WFL(rounded shoulders, elongation of trunk on R) Lumbar Assessment Lumbar Assessment: Exceptions to WFL(posterior and R lateral tilted) Postural Control Postural Control: Deficits on evaluation Trunk Control: R lean in sitting and standing Righting Reactions: mod impaired Protective Responses: mod impaired  Balance Balance Balance Assessed: Yes Static Sitting Balance Static Sitting - Level of Assistance: 4: Min assist Dynamic Sitting Balance Dynamic Sitting - Level of Assistance: 3: Mod assist Sitting balance - Comments: R lateral lean, dynamic reaching with A due to decreased safety awareness Static Standing Balance Static Standing - Level of Assistance: 3: Mod assist Dynamic Standing Balance Dynamic Standing - Balance Support: Bilateral upper extremity supported Dynamic Standing - Level of Assistance: 2: Max assist Dynamic Standing - Comments: Gait with max cues and A for preventing R lateral and posterior LOB Extremity Assessment      RLE Assessment RLE Assessment: Exceptions to [facility] Passive Range of Motion (PROM) Comments: WFL tight heel cords Active Range of Motion (AROM) Comments: no active ankle movement in open chain, hip flexion in synergy with knee extension General Strength Comments: hip flexion 3-/5, knee extension 3-/5, ankle DF 0/5 LLE  Assessment LLE Assessment: Within Functional Limits    Refer to Care Plan for Long Term Goals  Recommendations for other services: Neuropsych  Discharge Criteria: Patient will be discharged from PT if patient refuses treatment 3 consecutive times without medical reason, if treatment goals not met, if there is a change in medical status, if patient makes no progress towards goals or if patient is discharged from hospital.  The above assessment, treatment plan, treatment alternatives and goals were discussed and mutually agreed upon: by patient  Reginia Naas 06/06/2018, 5:30 PM

## 2018-06-06 NOTE — Progress Notes (Signed)
Pt resting condom cath on until morning. Denies any pain.

## 2018-06-06 NOTE — Evaluation (Signed)
Speech Language Pathology Assessment and Plan  Patient Details  Name: Joe Wood MRN: [id number] Date of Birth: 07-Oct-1950  SLP Diagnosis: Dysarthria;Cognitive Impairments  Rehab Potential: Good ELOS: 16-20 days   Today's Date: 06/06/2018 SLP Individual Time: 0930-1030 SLP Individual Time Calculation (min): 60 min   Problem List:  Patient Active Problem List   Diagnosis Date Noted  . Cerebellar stroke, acute ([place])   . Acute cerebral infarction ([place])   . Hyponatremia   . Diabetes mellitus, new onset ([place])   . Essential hypertension   . History of alcohol abuse   . Basal ganglia stroke ([place]) 06/05/2018  . 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 Hypertensive emergency 06/04/2018  . Hyperlipidemia LDL goal <70 06/04/2018  . Diabetes mellitus, type II ([place]) 06/04/2018  . Alcohol abuse 06/04/2018  . Acute ischemic stroke [facility]) - s/p tPA, d/t large vessel disease 05/29/2018   Past Medical History:  Past Medical History:  Diagnosis Date  . Hypertension    Past Surgical History:  Past Surgical History:  Procedure Laterality Date  . IR ANGIO INTRA EXTRACRAN SEL COM CAROTID INNOMINATE BILAT MOD SED  06/02/2018  . IR ANGIO VERTEBRAL SEL SUBCLAVIAN INNOMINATE UNI R MOD SED  06/02/2018  . IR ANGIOGRAM EXTREMITY LEFT  06/02/2018    Assessment / Plan / Recommendation Clinical Impression Patient is a 68 year old right-handed male with history of hypertension questionable medical compliance as well as alcohol use. Per chart review lives with spouse. Independent prior to admission. One level home 2 steps to entry. He is retired. Presented 05/29/2018 with right-sided weakness facial droop and slurred speech. Systolic blood pressure in the 240s. Placed on Cleviprex drip. Cranial CT scan showed age indeterminate lacunar infarct left thalamus. Negative for hemorrhage. CT angiogram of head and neck no emergent large vessel occlusion. Patient did receive TPA. MRI/MRA  showed acute perforator infarct in the left corona radiata. 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 Echocardiogram with ejection fraction of 15% normal systolic function. Cerebral angiogram 06/02/2018 per interventional radiology showed severe preocclusive mid basilar artery stenosis with no plan for intervention. Neurology follow-up maintained on aspirin and Plavix x3 months then Plavix alone.Subcutaneous Lovenox for DVT prophylaxis. New findings of elevated hemoglobin A1c of 6.5. Sliding scale insulin with diabetic teaching. Tolerating a regular diet. Therapy evaluations completed and patient was admitted for a comprehensive rehab program on 06/05/2018.  Patient presents largely with mild to moderate cognitive-communication deficits. Standardized evaluation provided today via COGNISTAT with the following scores: Mild impairment in orientation (deficits in date/time), complex language comprehension, and mathematical calculations; Moderate impairment in abstraction; Moderate-Severe impairment in visuospatial skills and short term memory; WFL for sustained attention, immediate recall, confrontational naming, safety/judgement. Speech intelligibility assessed informally during conversation - independently verbalizes at phrase/sentencne/conversation level with 90-95% intelligibility and x2-3 communication breakdowns between patient and SLP, requiring cues for repetition and increase vocal output. Patient demonstrates adequate awareness to current deficits related to speech and "thinking processes", however unclear of cognitive-communication baseline prior to hospitalization. He reported independence with medication/schedule management prior to hospitalization, however independently stated that he "will need help at home" his medication and schedule following discharge. Patient stated goal of addressing speech intelligibility and higher level  cognitive-communication skills for home (I.e. recall, executive function, problem solving) during rehab stay.  Skilled ST services to address cognitive-communication deficits to increase functional independence in home environment and overall speech intelligibility for enhanced functional communication. Patient left upright in  w/c, call bell and phone within reach, and chair alarm set.    Skilled Therapeutic Interventions          Administered cognitive-communication evaluation; Provided patient education re: deficits in speech/cognitive-communication skills and goals/interventions provided by skilled ST services.   SLP Assessment  Patient will need skilled [place] [facility]ology Services during CIR admission    Recommendations  Oral Care Recommendations: Oral care BID Recommendations for Other Services: Neuropsych consult Patient destination: Home Follow up Recommendations: 24 hour supervision/assistance Equipment Recommended: None recommended by SLP    SLP Frequency 3 to 5 out of 7 days   SLP Duration  SLP Intensity  SLP Treatment/Interventions 16-20 days  Minumum of 1-2 x/day, 30 to 90 minutes  Cognitive remediation/compensation;Cueing hierarchy;Functional tasks;Internal/external aids;Medication managment;Patient/family education;Speech/Language facilitation;Therapeutic Activities    Pain Pain Assessment Pain Scale: 0-10 Pain Score: 0-No pain  Prior Functioning Cognitive/Linguistic Baseline: Baseline deficits(per previous evaluating SLP) Baseline deficit details: Per previous SLP report, pt with noted memory changes for the past few months. Type of Home: House  Lives With: Spouse Available Help at Discharge: Family;Available 24 hours/day Vocation: Retired  Industrial/product designer Term Goals: Week 1: SLP Short Term Goal 1 (Week 1): Patient will articulate at the phrase level with >95% intelligibility utilizing compensatory strategies (i.e. slow rate, overarticulation, breath support) given  moderate cues in order to increase independent communication skills. SLP Short Term Goal 2 (Week 1): Patient will recall 4/4 WRAP memory strategies and utilize at least x1 strategy during a functional structured or unstructured task with 50% accuracy in order to combat memory deficits.  SLP Short Term Goal 3 (Week 1): Patient will complete functional executive function task for home (i.e. financial/schedule/medication management) with 60% accuracy and moderate cues in order to increase functional independence in the home environment. SLP Short Term Goal 4 (Week 1): Patient will complete complex, functional problem solving tasks with 75% accuracy and moderate cues in order to increase safety in the home environment.  Refer to Care Plan for Long Term Goals  Recommendations for other services: None   Discharge Criteria: Patient will be discharged from SLP if patient refuses treatment 3 consecutive times without medical reason, if treatment goals not met, if there is a change in medical status, if patient makes no progress towards goals or if patient is discharged from hospital.  The above assessment, treatment plan, treatment alternatives and goals were discussed and mutually agreed upon: by patient  Loni Beckwith, M.S. [place] Speech-Language Pathologist  Loni Beckwith 06/06/2018, 12:43 PM

## 2018-06-07 ENCOUNTER — Inpatient Hospital Stay (HOSPITAL_COMMUNITY): Payer: Medicare Other

## 2018-06-07 ENCOUNTER — Inpatient Hospital Stay (HOSPITAL_COMMUNITY): Payer: Medicare Other | Admitting: Occupational Therapy

## 2018-06-07 LAB — GLUCOSE, CAPILLARY
Glucose-Capillary: 189 mg/dL — ABNORMAL HIGH (ref 70–99)
Glucose-Capillary: 170 mg/dL — ABNORMAL HIGH (ref 70–99)
Glucose-Capillary: 173 mg/dL — ABNORMAL HIGH (ref 70–99)
Glucose-Capillary: 156 mg/dL — ABNORMAL HIGH (ref 70–99)

## 2018-06-07 NOTE — Progress Notes (Signed)
Occupational Therapy Session Note  Patient Details  Name: Joe Wood MRN: [id number] Date of Birth: 09-26-1950  Today's Date: 06/07/2018 OT Individual Time: 0810-0900 and 1015-1045 OT Individual Time Calculation (min): 50 min and 30 min   Short Term Goals: Week 1:  OT Short Term Goal 1 (Week 1): Pt will be able to sit safely on tub bench with S to be able to bathe LB with mod A. OT Short Term Goal 2 (Week 1): Pt will be able to transfer to toilet with mod A of 1.  OT Short Term Goal 3 (Week 1): Pt will demonstrate improved R side awareness to don shirt over R arm with min cues.  OT Short Term Goal 4 (Week 1): Pt will demonstrate improved motor planning to don shirt with min A and min cues.  OT Short Term Goal 5 (Week 1): Pt will be able to sit to stand for clothing management with mod A.   Skilled Therapeutic Interventions/Progress Updates:    Visit 1:  No c/o pain. Pt agreeable to an earlier OT session this am due to a scheduling change. Pt seen this session to focus on use of RUE and sit to stand skills with ADLs.  Pt worked on rolling onto L side with guiding A, pushed up to sit with min A and cues for rolling upper body forward, squat pivot to w/c to L with max A.  Pt sat in wC at sink to brush teeth (pt applied toothpaste, opening and closing tube using R hand as an active A).  Washed UB with A to lift R arm and then hand over hand A to use R hand to wash LUE.   Donned shirt with mod A with cues and A to don over R arm and head.   To don briefs, pt stood to sink with min A, with A to place R hand on sink. Mod A to support balance with decreased lean to the R.  Pt repeated second time to pull pants over hips.  Pt then worked on sit to stand a 3rd time to adjust w/c cushion using L hand on bed rail.  Mod A to support balance with increased use of RLE.  Improved awareness of R side today.  Pt resting In chair with belt alarm on.  Speech therapist arrived for his next session.  Visit 2:   No c/o pain Pt seen this session to focus on trunk control and RUE NMR. Placed mirror in front of pt with pt in his w/c to work on lateral weight shifts and forward reaching with hands clasped.  Pt able to reach forward and rise to sit with min guard for balance and cues to come into full back extension.   For RUE, used lightweight dowel bar with active grasp on bar for bimanual sh flex/ext with mod A to lift R arm to 90 degrees and single arm pushes.  Placed mat on pt's lap to work on graded towel pushes for shoulder flex/ext and horizontal abd/add.    Pt participated well demonstrating improved motor control of R arm today.  Resting in w/c with lap belt alarm on and all needs met.    Therapy Documentation Precautions:  Precautions Precautions: Fall Precaution Comments: R lean and decreased R side awareness Restrictions Weight Bearing Restrictions: No   Pain: Pain Assessment Pain Scale: 0-10 Pain Score: 0-No pain    Therapy/Group: Individual Therapy  [place] 06/07/2018, 9:57 AM

## 2018-06-07 NOTE — Plan of Care (Signed)
  Problem: Consults Goal: RH STROKE PATIENT EDUCATION Description See Patient Education module for education specifics  Outcome: Progressing Goal: Diabetes Guidelines if Diabetic/Glucose > 140 Description If diabetic or lab glucose is > 140 mg/dl - Initiate Diabetes/Hyperglycemia Guidelines & Document Interventions  Outcome: Progressing   Problem: RH BOWEL ELIMINATION Goal: RH STG MANAGE BOWEL WITH ASSISTANCE Description STG Manage Bowel with Mod I Assistance.  Outcome: Progressing Goal: RH STG MANAGE BOWEL W/MEDICATION W/ASSISTANCE Description STG Manage Bowel with Medication with Min Assistance.  Outcome: Progressing   Problem: RH BLADDER ELIMINATION Goal: RH STG MANAGE BLADDER WITH ASSISTANCE Description STG Manage Bladder With Mod I Assistance  Outcome: Progressing Goal: RH STG MANAGE BLADDER WITH MEDICATION WITH ASSISTANCE Description STG Manage Bladder With Medication With Min Assistance.  Outcome: Progressing   Problem: RH SKIN INTEGRITY Goal: RH STG MAINTAIN SKIN INTEGRITY WITH ASSISTANCE Description STG Maintain Skin Integrity With Mod Assistance.  Outcome: Progressing Goal: RH STG ABLE TO PERFORM INCISION/WOUND CARE W/ASSISTANCE Description STG Able To Perform Incision/Wound Care With Mod Assistance.  Outcome: Progressing   Problem: RH SAFETY Goal: RH STG ADHERE TO SAFETY PRECAUTIONS W/ASSISTANCE/DEVICE Description STG Adhere to Safety Precautions With Mod Assistance and appropriate assistive Device.  Outcome: Progressing   Problem: RH PAIN MANAGEMENT Goal: RH STG PAIN MANAGED AT OR BELOW PT'S PAIN GOAL Description <3 on a 0-10 pain scale  Outcome: Progressing   Problem: RH KNOWLEDGE DEFICIT Goal: RH STG INCREASE KNOWLEDGE OF DIABETES Description Patient will demonstrate knowledge on diabetes medications, dietary restrictions, and blood sugar parameters to rehab staff with min assist. Patient will follow up with MD after discharge.  Outcome:  Progressing Goal: RH STG INCREASE KNOWLEDGE OF HYPERTENSION Description Patient will demonstrate knowledge of HTN medications, dietary restrictions, and BP parameters with min assist from rehab staff. Patient will follow up with MD after discharge.   Outcome: Progressing Goal: RH STG INCREASE KNOWLEDGE OF STROKE PROPHYLAXIS Description Patient will demonstrate knowledge of medications used to help prevent future strokes with min assist from rehab staff and follow up with MD after discharge.  Outcome: Progressing   

## 2018-06-07 NOTE — Progress Notes (Signed)
Joe Wood is a 68 y.o. male 05-05-1950 [id number]  Subjective: No new complaints. No new problems. Slept well. Feeling OK.  Objective: Vital signs in last 24 hours: Temp:  [98.2 F (36.8 C)-98.5 F (36.9 C)] 98.2 F (36.8 C) (05/16 0518) Pulse Rate:  [77-90] 77 (05/16 0518) Resp:  [16-20] 16 (05/16 0518) BP: (146-158)/(90-96) 155/96 (05/16 0518) SpO2:  [96 %-97 %] 96 % (05/16 0518) Weight change:  Last BM Date: 06/05/18  Intake/Output from previous day: 05/15 0701 - 05/16 0700 In: 240 [P.O.:240] Out: 950 [Urine:950] Last cbgs: CBG (last 3)  Recent Labs    06/06/18 2053 06/07/18 0635 06/07/18 1203  GLUCAP 175* 189* 170*     Physical Exam General: No apparent distress, eating breakfast HEENT: not dry Lungs: Normal effort. Lungs clear to auscultation, no crackles or wheezes. Cardiovascular: Regular rate and rhythm, no edema Abdomen: S/NT/ND; BS(+) Musculoskeletal:  unchanged Neurological: No new neurological deficits Wounds: N/A    Skin: clear  Aging changes Mental state: Alert, dysarthric, cooperative    Lab Results: BMET    Component Value Date/Time   NA 134 (L) 06/06/2018 0247   K 4.1 06/06/2018 0247   CL 100 06/06/2018 0247   CO2 25 06/06/2018 0247   GLUCOSE 157 (H) 06/06/2018 0247   BUN 14 06/06/2018 0247   CREATININE 1.07 06/06/2018 0247   CALCIUM 9.6 06/06/2018 0247   GFRNONAA >60 06/06/2018 0247   GFRAA >60 06/06/2018 0247   CBC    Component Value Date/Time   WBC 7.8 06/06/2018 0247   RBC 5.24 06/06/2018 0247   HGB 14.7 06/06/2018 0247   HCT 42.4 06/06/2018 0247   PLT 363 06/06/2018 0247   MCV 80.9 06/06/2018 0247   MCH 28.1 06/06/2018 0247   MCHC 34.7 06/06/2018 0247   RDW 12.6 06/06/2018 0247   LYMPHSABS 2.6 06/06/2018 0247   MONOABS 0.9 06/06/2018 0247   EOSABS 0.2 06/06/2018 0247   BASOSABS 0.0 06/06/2018 0247    Studies/Results: No results found.  Medications: I have reviewed the patient's current  medications.  Assessment/Plan:  1.  Right-sided hemiparesis, facial droop with dysarthria secondary to bilateral punctuate infarcts with the largest infarct in the left BG/CR on 05/29/2018.  Status post TPA.  Aspirin and Plavix for 3 months.  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  CIR 2.  DVT prophylaxis with Lovenox.  Antiplatelet therapy with aspirin, Plavix.  Plan to discontinue aspirin in 3 months 3.  Pain management with PRN Tylenol 4.  Emotional support 5.  Skin care 6.  Hypertension with history of medical noncompliance.  On HCTZ, Lopressor, Norvasc 7.  Dyslipidemia on Lipitor 8.  History of alcohol use-counseling 9.  Type 2 diabetes.  Sliding scale insulin.  Diabetic teaching 10.  Hyponatremia.  Monitor bmet     Length of stay, days: 2  Sonda Primes , MD 06/07/2018, 2:01 PM

## 2018-06-07 NOTE — Progress Notes (Signed)
Speech Language Pathology Daily Session Note  Patient Details  Name: Joe Wood MRN: [id number] Date of Birth: Jun 04, 1950  Today's Date: 06/07/2018 SLP Individual Time: 8416-6063 SLP Individual Time Calculation (min): 45 min  Short Term Goals: Week 1: SLP Short Term Goal 1 (Week 1): Patient will articulate at the phrase level with >95% intelligibility utilizing compensatory strategies (i.e. slow rate, overarticulation, breath support) given moderate cues in order to increase independent communication skills. SLP Short Term Goal 2 (Week 1): Patient will recall 4/4 WRAP memory strategies and utilize at least x1 strategy during a functional structured or unstructured task with 50% accuracy in order to combat memory deficits.  SLP Short Term Goal 3 (Week 1): Patient will complete functional executive function task for home (i.e. financial/schedule/medication management) with 60% accuracy and moderate cues in order to increase functional independence in the home environment. SLP Short Term Goal 4 (Week 1): Patient will complete complex, functional problem solving tasks with 75% accuracy and moderate cues in order to increase safety in the home environment.  Skilled Therapeutic Interventions: Skilled ST services focused on cognitive skills. Upon questioning pt pertaining to independence of complex problem solving tasks piror to acute CVA, pt stated his wife handled account balancing and assisted with medication. SLP facilitated complex problem solving and executive function skills utilizing scheduling task, pt required Max A verbal cues for organization of thought and problem solving. Pt did not have reading glasses present further impacting performance, however when SLP read statements aloud pt still required max A verbal cues. Halfway through task, pt denied difficulty, however in reflect of performance appeared surprised and agreed with needed assistance. Pt was left in room with call bell within  reach and chair alarm set. ST recommends to continue skilled ST services.      Pain Pain Assessment Pain Score: 0-No pain  Therapy/Group: Individual Therapy  Pyper Olexa  [facility] 06/07/2018, 2:52 PM

## 2018-06-07 NOTE — Progress Notes (Signed)
Physical Therapy Session Note  Patient Details  Name: PRINCETYN LARICK MRN: [id number] Date of Birth: 05-May-1950  Today's Date: 06/07/2018 PT Individual Time: 1347-1500 PT Individual Time Calculation (min): 73 min   Short Term Goals: Week 1:  PT Short Term Goal 1 (Week 1): Patient to perform functional transfers consistently at mod A. PT Short Term Goal 2 (Week 1): Patient to demonstrate static standing balance with UE support and min A.  PT Short Term Goal 3 (Week 1): Patient to demonstrate static sitting balance with S.  PT Short Term Goal 4 (Week 1): Patient to ambulate 30' with LRAD and mod A.  Skilled Therapeutic Interventions/Progress Updates:    Patient in supine agreeable to PT.  Obtained hemi height 18x16 w/c to replace taller 20x18 w/c in the room.  Also lap tray for R UE.  Patient pivot to w/c to L with cues and mod A.  Patient able to propel short distance initially with L UE/LE and min to mod cues.  Patient sit to stand to RW with R hand splint mod A and standing balance needing mod to max A due to R lateral lean.  Performed lateral weight shifts and cues for posture in standing.  Patient with A on R side and with ace wrap on R ankle for DF assist ambulated about 47' with mod A cues for L lateral weight shift, posture, progressing R foot, sequencing and increased assist with turning with RW to sit in chair.  Seated in hallway on chair no armrests performed forward and R/L lateral leans with bilat hands on ball and cues and guidance to re-find midline.  Patient in standing with L rail performed step taps with L foot to 4" step with assist for R hip control and COG over BOS for balance on R during SLS task on R.  Gait back to chair x 12' with mod to max A when fatigued and difficulty backing up to chair.  Propelled w/c x 150' with S and cues hemitechnique L UE/LE.  Back in room left in w/c with alarm belt activated and call bell in reach.   Therapy Documentation Precautions:   Precautions Precautions: Fall Precaution Comments: R lean and decreased R side awareness Restrictions Weight Bearing Restrictions: No Pain: Pain Assessment Pain Score: 0-No pain    Therapy/Group: Individual Therapy  Elray Mcgregor  [place], [place] 06/07/2018, 5:02 PM

## 2018-06-08 LAB — GLUCOSE, CAPILLARY
Glucose-Capillary: 164 mg/dL — ABNORMAL HIGH (ref 70–99)
Glucose-Capillary: 158 mg/dL — ABNORMAL HIGH (ref 70–99)
Glucose-Capillary: 186 mg/dL — ABNORMAL HIGH (ref 70–99)
Glucose-Capillary: 139 mg/dL — ABNORMAL HIGH (ref 70–99)
Glucose-Capillary: 141 mg/dL — ABNORMAL HIGH (ref 70–99)

## 2018-06-08 NOTE — Progress Notes (Signed)
Joe Wood is a 68 y.o. male 05-22-1950 [id number]  Subjective: No new complaints. No new problems. Slept well. Feeling OK.  Objective: Vital signs in last 24 hours: Temp:  [98.2 F (36.8 C)-98.8 F (37.1 C)] 98.2 F (36.8 C) (05/17 0513) Pulse Rate:  [76-84] 76 (05/17 0513) Resp:  [18] 18 (05/17 0513) BP: (135-154)/(87-95) 138/95 (05/17 0513) SpO2:  [96 %-100 %] 100 % (05/17 0513) Weight change:  Last BM Date: 06/05/18  Intake/Output from previous day: 05/16 0701 - 05/17 0700 In: 680 [P.O.:680] Out: 300 [Urine:300] Last cbgs: CBG (last 3)  Recent Labs    06/08/18 0636 06/08/18 0841 06/08/18 1213  GLUCAP 164* 158* 186*     Physical Exam General: No apparent distress   HEENT: not dry Lungs: Normal effort. Lungs clear to auscultation, no crackles or wheezes. Cardiovascular: Regular rate and rhythm, no edema Abdomen: S/NT/ND; BS(+) Musculoskeletal:  unchanged Neurological: No new neurological deficits Wounds: N/A    Skin: clear  Aging changes Mental state: Alert, cooperative    Lab Results: BMET    Component Value Date/Time   NA 134 (L) 06/06/2018 0247   K 4.1 06/06/2018 0247   CL 100 06/06/2018 0247   CO2 25 06/06/2018 0247   GLUCOSE 157 (H) 06/06/2018 0247   BUN 14 06/06/2018 0247   CREATININE 1.07 06/06/2018 0247   CALCIUM 9.6 06/06/2018 0247   GFRNONAA >60 06/06/2018 0247   GFRAA >60 06/06/2018 0247   CBC    Component Value Date/Time   WBC 7.8 06/06/2018 0247   RBC 5.24 06/06/2018 0247   HGB 14.7 06/06/2018 0247   HCT 42.4 06/06/2018 0247   PLT 363 06/06/2018 0247   MCV 80.9 06/06/2018 0247   MCH 28.1 06/06/2018 0247   MCHC 34.7 06/06/2018 0247   RDW 12.6 06/06/2018 0247   LYMPHSABS 2.6 06/06/2018 0247   MONOABS 0.9 06/06/2018 0247   EOSABS 0.2 06/06/2018 0247   BASOSABS 0.0 06/06/2018 0247    Studies/Results: No results found.  Medications: I have reviewed the patient's current medications.  Assessment/Plan:  1.   Right-sided hemiparesis, facial droop with dysarthria secondary to bilateral punctuate infarcts with the largest infarct in the left BG/CR on 05/29/2018.  Status post TPA.  Aspirin and Plavix for 3 months.  There is a large vessel disease on 50% left MCA stenosis.  CIR 2.  DVT prophylaxis with Lovenox.  Antiplatelet therapy with aspirin and Plavix.  Plan to discontinue aspirin in 3 months 3.  Pain management with Tylenol PRN 4.  Emotional support 5.  Routine skin care 6.  Hypertension with history of medical noncompliance.  On HCTZ, Lopressor, Norvasc 7.  Hyperlipidemia.  On Lipitor 8.  History of alcohol use-counseling 9.  Type 2 diabetes.  Sliding scale insulin.  Diabetic teaching 10.  Hyponatremia.  Monitor sodium    Length of stay, days: 3  Sonda Primes , MD 06/08/2018, 2:07 PM

## 2018-06-09 ENCOUNTER — Inpatient Hospital Stay (HOSPITAL_COMMUNITY): Payer: Medicare Other | Admitting: Occupational Therapy

## 2018-06-09 ENCOUNTER — Inpatient Hospital Stay (HOSPITAL_COMMUNITY): Payer: Medicare Other | Admitting: Speech Pathology

## 2018-06-09 ENCOUNTER — Encounter (HOSPITAL_COMMUNITY): Payer: Medicare Other | Admitting: Psychology

## 2018-06-09 ENCOUNTER — Inpatient Hospital Stay (HOSPITAL_COMMUNITY): Payer: Medicare Other | Admitting: Physical Therapy

## 2018-06-09 LAB — GLUCOSE, CAPILLARY
Glucose-Capillary: 125 mg/dL — ABNORMAL HIGH (ref 70–99)
Glucose-Capillary: 143 mg/dL — ABNORMAL HIGH (ref 70–99)
Glucose-Capillary: 148 mg/dL — ABNORMAL HIGH (ref 70–99)
Glucose-Capillary: 142 mg/dL — ABNORMAL HIGH (ref 70–99)

## 2018-06-09 MED ORDER — LIVING WELL WITH DIABETES BOOK
Freq: Once | Status: AC
  Administered 2018-06-09: 15:00:00
  Filled 2018-06-09: qty 1

## 2018-06-09 NOTE — Progress Notes (Signed)
Speech Language Pathology Daily Session Note  Patient Details  Name: Joe Wood MRN: [id number] Date of Birth: 12/15/1950  Today's Date: 06/09/2018 SLP Individual Time: 1000-1055 SLP Individual Time Calculation (min): 55 min  Short Term Goals: Week 1: SLP Short Term Goal 1 (Week 1): Patient will articulate at the phrase level with >95% intelligibility utilizing compensatory strategies (i.e. slow rate, overarticulation, breath support) given moderate cues in order to increase independent communication skills. SLP Short Term Goal 2 (Week 1): Patient will recall 4/4 WRAP memory strategies and utilize at least x1 strategy during a functional structured or unstructured task with 50% accuracy in order to combat memory deficits.  SLP Short Term Goal 3 (Week 1): Patient will complete functional executive function task for home (i.e. financial/schedule/medication management) with 60% accuracy and moderate cues in order to increase functional independence in the home environment. SLP Short Term Goal 4 (Week 1): Patient will complete complex, functional problem solving tasks with 75% accuracy and moderate cues in order to increase safety in the home environment.  Skilled Therapeutic Interventions: Skilled treatment session focused on cognitive goals. SLP facilitated session by providing Max A verbal cues for functional problem solving and to self-monitor and correct errors during a basic money management task. SLP also facilitated session by providing total A for recall of his current medications and their functions as well as Max A verbal and visual cues for problem solving while organizing a BID pill box. Task will need to be completed during next session. Patient left upright in the wheelchair with alarm on and all needs within reach. Continue with current plan of care.      Pain Pain Assessment Pain Score: 0-No pain  Therapy/Group: Individual Therapy  Coletta Lockner 06/09/2018, 10:55 AM

## 2018-06-09 NOTE — Progress Notes (Signed)
[facility] PHYSICAL MEDICINE & REHABILITATION PROGRESS NOTE  Subjective/Complaints: Patient seen laying in bed this morning.  He states he slept well overnight.  He states he had a good weekend.  ROS: Denies CP, shortness of breath, nausea, vomiting, diarrhea.  Objective: Vital Signs: Blood pressure (!) 134/94, pulse 70, temperature 97.7 F (36.5 C), temperature source Oral, resp. rate 20, height 5\' 8"  (1.727 m), weight 80.8 kg, SpO2 97 %. No results found. No results for input(s): WBC, HGB, HCT, PLT in the last 72 hours. No results for input(s): NA, K, CL, CO2, GLUCOSE, BUN, CREATININE, CALCIUM in the last 72 hours.  Physical Exam: BP (!) 134/94 (BP Location: Right Arm)   Pulse 70   Temp 97.7 F (36.5 C) (Oral)   Resp 20   Ht 5\' 8"  (1.727 m)   Wt 80.8 kg   SpO2 97%   BMI 27.08 kg/m  Constitutional: No distress . Vital signs reviewed. HENT: Normocephalic.  Atraumatic. Eyes: EOMI. No discharge. Cardiovascular: No JVD. Respiratory: Normal effort. GI: Non-distended. Musc: No edema or tenderness in extremities. Neurological:  Alert and oriented Makes eye contact with examiner.  Dysarthria, stable Facial droop, stable Follows commands Motor:  RUE: Shoulder abduction 2+/5, distally 3+/5 with apraxia, stable Right lower extremity: Hip flexion, knee extension 4-/5, ankle dorsiflexion 0/5  Skin: Warm and dry.  Intact. Psych: Normal mood.  Normal behavior.  Assessment/Plan: 1. Functional deficits secondary to bilateral brain infarcts which require 3+ hours per day of interdisciplinary therapy in a comprehensive inpatient rehab setting.  Physiatrist is providing close team supervision and 24 hour management of active medical problems listed below.  Physiatrist and rehab team continue to assess barriers to discharge/monitor patient progress toward functional and medical goals  Care Tool:  Bathing    Body parts bathed by patient: Chest, Abdomen, Front perineal area,  Buttocks, Right upper leg, Left upper leg, Left lower leg, Face, Left arm, Right arm   Body parts bathed by helper: Right lower leg     Bathing assist Assist Level: Minimal Assistance - Patient > 75%     Upper Body Dressing/Undressing Upper body dressing Upper body dressing/undressing activity did not occur (including orthotics): N/A What is the patient wearing?: Pull over shirt    Upper body assist Assist Level: Minimal Assistance - Patient > 75%    Lower Body Dressing/Undressing Lower body dressing      What is the patient wearing?: Pants, Underwear/pull up     Lower body assist Assist for lower body dressing: Maximal Assistance - Patient 25 - 49%     Toileting Toileting Toileting Activity did not occur (Clothing management and hygiene only): Safety/medical concerns  Toileting assist Assist for toileting: Maximal Assistance - Patient 25 - 49%     Transfers Chair/bed transfer  Transfers assist     Chair/bed transfer assist level: Maximal Assistance - Patient 25 - 49% Chair/bed transfer assistive device: Mechanical lift(stedy)   Locomotion Ambulation   Ambulation assist      Assist level: Moderate Assistance - Patient 50 - 74% Assistive device: Walker-rolling(R hand splint & ace wrap R LE) Max distance: 12'   Walk 10 feet activity   Assist     Assist level: Moderate Assistance - Patient - 50 - 74% Assistive device: Walker-rolling, Orthosis(R hand splint & ace wrap R LE)   Walk 50 feet activity   Assist Walk 50 feet with 2 turns activity did not occur: Safety/medical concerns         Walk  150 feet activity   Assist Walk 150 feet activity did not occur: Safety/medical concerns         Walk 10 feet on uneven surface  activity   Assist Walk 10 feet on uneven surfaces activity did not occur: Safety/medical concerns         Wheelchair     Assist   Type of Wheelchair: Manual    Wheelchair assist level: Supervision/Verbal  cueing Max wheelchair distance: 150'    Wheelchair 50 feet with 2 turns activity    Assist        Assist Level: Supervision/Verbal cueing   Wheelchair 150 feet activity     Assist Wheelchair 150 feet activity did not occur: Safety/medical concerns   Assist Level: Supervision/Verbal cueing         Medical Problem List and Plan: 1.Right side weakness facial droop with dysarthriasecondary to bilateral punctate infarcts with largest infarct in left leftBG/CR on 05/29/2018. Status post TPA. 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  Continue CIR  Weekend notes reviewed 2. Antithrombotics: -DVT/anticoagulation:Subcutaneous Lovenox -antiplatelet therapy: Aspirin 81 mg daily, Plavix 75 mg daily x3 months then Plavix alone   CBC within normal limits on 5/15 3. Pain Management:Tylenol as needed 4. Mood:Provide emotional support -antipsychotic agents: N/A 5. Neuropsych: This patientiscapable of making decisions on hisown behalf. 6. Skin/Wound Care:Routine skin checks 7. Fluids/Electrolytes/Nutrition:Routine in and outs  8.Hypertension with questionable medical noncompliance. HCTZ 12.5 mg dailyLopressor 25 mg twice daily, Norvasc 10 mg daily.   Diastolic elevations on 5/18  Monitor with increased mobility 9.Hyperlipidemia. Lipitor 10.History of alcohol use.  Counseling 11.New findings type 2 diabetes mellitus.Hemoglobin A1c 6.5. SSI. Diabetic teaching  Elevated on 5/18, will consider addition of medications tomorrow if persistently elevated  Monitor with increased mobility 12.  Hyponatremia  Sodium 134 on 5/15  Continue to monitor  LOS: 4 days A FACE TO FACE EVALUATION WAS PERFORMED  Ankit Karis Juba 06/09/2018, 12:41 PM

## 2018-06-09 NOTE — Progress Notes (Signed)
Physical Therapy Session Note  Patient Details  Name: Joe Wood MRN: [id number] Date of Birth: November 11, 1950  Today's Date: 06/09/2018 PT Individual Time: 1355-1450 PT Individual Time Calculation (min): 55 min   Short Term Goals: Week 1:  PT Short Term Goal 1 (Week 1): Patient to perform functional transfers consistently at mod A. PT Short Term Goal 2 (Week 1): Patient to demonstrate static standing balance with UE support and min A.  PT Short Term Goal 3 (Week 1): Patient to demonstrate static sitting balance with S.  PT Short Term Goal 4 (Week 1): Patient to ambulate 61' with LRAD and mod A.  Skilled Therapeutic Interventions/Progress Updates:   pt in bed, agreeable to therapy.  Mod A for supine to sit.  Mod A for squat pivot transfers throughout session with mod cuing for UE/LE placement each attempt.  Gait training with RW 20' x 2 with mod A for wt shifts and Rt LE advancement in controlled environment, max A for turning to sit.  Sit <> stand blocked practice with mod A with mod cuing for UE placement.  Standing reaching task with focus on Rt LE strengthening with pt able to match cards with 100% accuracy, mod A for standing balance without UE support.  Standing pre gait stepping with LT LE with focus on Rt LE stance control with pt able to perform with mod/max A.  Toilet transfer with max A to Rt side.  Mod A to stand with total A for clothing management. Seated AAROM for Rt LE hip abd/add, knee flex/ext with improving mm activation with tactile cues.  Pt left in bed with needs at hand, alarm set.  Therapy Documentation Precautions:  Precautions Precautions: Fall Precaution Comments: R lean and decreased R side awareness Restrictions Weight Bearing Restrictions: No Pain:  no c/o pain   Therapy/Group: Individual Therapy  Verdun Rackley 06/09/2018, 2:57 PM

## 2018-06-09 NOTE — Progress Notes (Signed)
Occupational Therapy Session Note  Patient Details  Name: JAISHAUN MCNAB MRN: [id number] Date of Birth: 04-Oct-1950  Today's Date: 06/09/2018 OT Individual Time: 8295-6213 OT Individual Time Calculation (min): 60 min    Short Term Goals: Week 1:  OT Short Term Goal 1 (Week 1): Pt will be able to sit safely on tub bench with S to be able to bathe LB with mod A. OT Short Term Goal 2 (Week 1): Pt will be able to transfer to toilet with mod A of 1.  OT Short Term Goal 3 (Week 1): Pt will demonstrate improved R side awareness to don shirt over R arm with min cues.  OT Short Term Goal 4 (Week 1): Pt will demonstrate improved motor planning to don shirt with min A and min cues.  OT Short Term Goal 5 (Week 1): Pt will be able to sit to stand for clothing management with mod A.   Skilled Therapeutic Interventions/Progress Updates:    Pt received in bed agreeable to a shower and use of STEDY to shower.   He rolled onto L side with CGA and then pushed up to sit with L arm with CGA.  Pt demonstrated much improved sit balance, sitting at EOB statically with S only.  Pt then stood in [place] lift with CGA.  Pt positioned onto BSC in shower and he maintained sitting balance and was able to bathe with min A just needing A for L arm and R foot.  Worked on Hand over hand with R hand to wash L arm and torso.   Improved R side awareness and attention.   Pt transferred to wc and donned shirt with verbal cues and guiding A to start over R hand, he then completed with verbal cues.   Able to place L foot into pants with min A and pull pants partially over hips in stands.  Sit to stand from w/c with min A and initially leaning to his R. With feedback and cues pt able to correct and was actually able to stand with CGA in a static stand for 1 minute.  Pt brushed teeth at sink with set up.   Pt resting in w/c with belt alarm on and all needs met.    Therapy Documentation Precautions:  Precautions Precautions:  Fall Precaution Comments: R lean and decreased R side awareness Restrictions Weight Bearing Restrictions: No    Pain: Pain Assessment Pain Score: 0-No pain    Therapy/Group: Individual Therapy  [place] 06/09/2018, 9:59 AM

## 2018-06-09 NOTE — Consult Note (Signed)
Neuropsychological Consultation   Patient:   Aleene DavidsonDarryl E Swartzlander   DOB:   07/07/1950  MR Number:  [id number]  [facility]  [facility] [address] [id number] [facility] [place] [id number] Dept: [phone] Loc: [phone]           Date of Service:   06/09/2018  Start Time:   11 AM End Time:   12 PM  Provider/Observer:  Arley PhenixJohn , Psy.D.       Clinical Neuropsychologist       Billing Code/Service: [phone]  Chief Complaint:    Genoveva IllDarrell Vallas is a 68 year old right handed male with history of hypertension questionable medical compliance as well as alcohol use.  Presented 05/29/2018 with right sided weakness facial droop and slurred speech.  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  Patient was admitted for CIR program.    Reason for Service:  JJO:ACZYSAYHPI:Darrell E. Lindsayis a 68 year old right-handed male with history of hypertension questionable medical compliance as well as alcohol use. Per chart review lives with spouse. Independent prior to admission. One level home 2 steps to entry. He is retired. Presented 05/29/2018 with right-sided weakness facial droop and slurred speech. Systolic blood pressure in the 240s. Placed on Cleviprex drip. Cranial CT scan showed age indeterminate lacunar infarct left thalamus. Negative for hemorrhage. CT angiogram of head and neck no emergent large vessel occlusion. Patient did receive TPA. 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 Echocardiogram with ejection fraction of 65% normal systolic function. Cerebral angiogram 06/02/2018 per interventional radiology showed severe preocclusive mid basilar artery stenosis with no plan for intervention. Neurology follow-up maintained on  aspirin and Plavix x3 months then Plavix alone.Subcutaneous Lovenox for DVT prophylaxis. New findings of elevated hemoglobin A1c of 6.5. Sliding scale insulin with diabetic teaching. Tolerating a regular diet. Therapy evaluations completed and patient was admitted for a comprehensive rehab program  Current Status:  The patient reports that he is coping with stroke event and does see some improvement.  Motivated to continue with therapies.  Patient continuing with significant motor deficits impacting daily living skills.    Behavioral Observation: Aleene DavidsonDarryl E Loux  presents as a 68 y.o.-year-old Right African American Male who appeared his stated age. his dress was Appropriate and he was Well Groomed and his manners were Appropriate to the situation.  his participation was indicative of Appropriate and Redirectable behaviors.  There were any physical disabilities noted.  he displayed an appropriate level of cooperation and motivation.     Interactions:    Active Appropriate and Redirectable  Attention:   abnormal and attention span appeared shorter than expected for age  Memory:   abnormal; remote memory intact, recent memory impaired  Visuo-spatial:  not examined  Speech (Volume):  low  Speech:   normal; normal  Thought Process:  Coherent and Relevant  Though Content:  WNL; not suicidal and not homicidal  Orientation:   person, place and situation  Judgment:   Fair  Planning:   Fair  Affect:    Blunted  Mood:    Depressed  Insight:   Fair  Intelligence:   normal  Substance Use:  There is a documented history of alcohol abuse confirmed by the patient.    Medical History:   Past Medical History:  Diagnosis  Date  . Hypertension    Psychiatric History:  Patient denies past psychiatric history  Family Med/Psych History:  Family History  Problem Relation Age of Onset  . Cancer Father     Risk of Suicide/Violence: low Patient denies SI or  HI.  Impression/DX:  Zacari Boies is a 68 year old right handed male with history of hypertension questionable medical compliance as well as alcohol use.  Presented 05/29/2018 with right sided weakness facial droop and slurred speech.  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  Patient was admitted for CIR program.    The patient reports that he is coping with stroke event and does see some improvement.  Motivated to continue with therapies.  Patient continuing with significant motor deficits impacting daily living skills.  Disposition/Plan:  Worked on coping and adjustment issues dealing with residual impact from CVA.  Diagnosis:    Basal ganglia stroke [facility]) - Plan: Ambulatory referral to Neurology         Electronically Signed   _______________________ Arley Phenix, Psy.D.

## 2018-06-10 ENCOUNTER — Inpatient Hospital Stay (HOSPITAL_COMMUNITY): Payer: Medicare Other | Admitting: Occupational Therapy

## 2018-06-10 ENCOUNTER — Inpatient Hospital Stay (HOSPITAL_COMMUNITY): Payer: Medicare Other | Admitting: Speech Pathology

## 2018-06-10 ENCOUNTER — Inpatient Hospital Stay (HOSPITAL_COMMUNITY): Payer: Medicare Other | Admitting: Physical Therapy

## 2018-06-10 LAB — GLUCOSE, CAPILLARY
Glucose-Capillary: 148 mg/dL — ABNORMAL HIGH (ref 70–99)
Glucose-Capillary: 150 mg/dL — ABNORMAL HIGH (ref 70–99)
Glucose-Capillary: 154 mg/dL — ABNORMAL HIGH (ref 70–99)
Glucose-Capillary: 134 mg/dL — ABNORMAL HIGH (ref 70–99)

## 2018-06-10 MED ORDER — METFORMIN HCL 500 MG PO TABS
250.0000 mg | ORAL_TABLET | Freq: Every day | ORAL | Status: DC
  Administered 2018-06-10 – 2018-06-13 (×4): 250 mg via ORAL
  Filled 2018-06-10 (×4): qty 1

## 2018-06-10 NOTE — Progress Notes (Signed)
Occupational Therapy Session Note  Patient Details  Name: RUDOLPHE TESTERMAN MRN: [id number] Date of Birth: 1951/01/09  Today's Date: 06/10/2018 OT Individual Time: 0930-1015 OT Individual Time Calculation (min): 45 min    Short Term Goals: Week 1:  OT Short Term Goal 1 (Week 1): Pt will be able to sit safely on tub bench with S to be able to bathe LB with mod A. OT Short Term Goal 2 (Week 1): Pt will be able to transfer to toilet with mod A of 1.  OT Short Term Goal 3 (Week 1): Pt will demonstrate improved R side awareness to don shirt over R arm with min cues.  OT Short Term Goal 4 (Week 1): Pt will demonstrate improved motor planning to don shirt with min A and min cues.  OT Short Term Goal 5 (Week 1): Pt will be able to sit to stand for clothing management with mod A.   Skilled Therapeutic Interventions/Progress Updates:    Treatment session with focus on ADL retraining with bathing and dressing at sit > stand level.  Pt received semi-reclined in bed agreeable to bathing.  Pt requested to wash up at sink this session.  Completed bed mobility with mod assist and squat pivot transfer to w/c mod assist.  Therapist providing mod cues for sequencing and problem solving to advance RLE off EOB and during transfer.  Engaged in UB bathing with hand over hand to incorporate RUE in to bathing task when washing Lt arm.  Engaged in sit > stand with min-mod assist throughout self-care tasks, pt continues to demonstrate Rt lean in standing but able to correct with min cues.  Utilized [employer] for [employer] of Rt lean in standing.  Encouraged pt to obtain more equal WB through BLE, engaging in weight shifting to increase WB through RLE in standing.  Educated on hemi-dressing technique and mod multimodal cues for sequencing and awareness when donning shirt.  Pt completed oral care in sitting, utilizing LUE solely - no attempts to even incorporate RUE.  Pt remained seated in w/c with seat belt alarm on and all  needs in reach.  RN arriving to provide AM meds.  Therapy Documentation Precautions:  Precautions Precautions: Fall Precaution Comments: R lean and decreased R side awareness Restrictions Weight Bearing Restrictions: No Pain: Pain Assessment Pain Scale: 0-10 Pain Score: 0-No pain  Therapy/Group: Individual Therapy  Rosalio Loud 06/10/2018, 12:05 PM

## 2018-06-10 NOTE — Plan of Care (Signed)
  Problem: Consults Goal: RH STROKE PATIENT EDUCATION Description See Patient Education module for education specifics  Outcome: Progressing Goal: Diabetes Guidelines if Diabetic/Glucose > 140 Description If diabetic or lab glucose is > 140 mg/dl - Initiate Diabetes/Hyperglycemia Guidelines & Document Interventions  Outcome: Progressing   Problem: RH BOWEL ELIMINATION Goal: RH STG MANAGE BOWEL WITH ASSISTANCE Description STG Manage Bowel with Mod I Assistance.  Outcome: Progressing Goal: RH STG MANAGE BOWEL W/MEDICATION W/ASSISTANCE Description STG Manage Bowel with Medication with Min Assistance.  Outcome: Progressing   Problem: RH BLADDER ELIMINATION Goal: RH STG MANAGE BLADDER WITH ASSISTANCE Description STG Manage Bladder With Mod I Assistance  Outcome: Progressing Goal: RH STG MANAGE BLADDER WITH MEDICATION WITH ASSISTANCE Description STG Manage Bladder With Medication With Min Assistance.  Outcome: Progressing   Problem: RH SKIN INTEGRITY Goal: RH STG MAINTAIN SKIN INTEGRITY WITH ASSISTANCE Description STG Maintain Skin Integrity With Mod Assistance.  Outcome: Progressing Goal: RH STG ABLE TO PERFORM INCISION/WOUND CARE W/ASSISTANCE Description STG Able To Perform Incision/Wound Care With Mod Assistance.  Outcome: Progressing   Problem: RH SAFETY Goal: RH STG ADHERE TO SAFETY PRECAUTIONS W/ASSISTANCE/DEVICE Description STG Adhere to Safety Precautions With Mod Assistance and appropriate assistive Device.  Outcome: Progressing   Problem: RH PAIN MANAGEMENT Goal: RH STG PAIN MANAGED AT OR BELOW PT'S PAIN GOAL Description <3 on a 0-10 pain scale  Outcome: Progressing   Problem: RH KNOWLEDGE DEFICIT Goal: RH STG INCREASE KNOWLEDGE OF DIABETES Description Patient will demonstrate knowledge on diabetes medications, dietary restrictions, and blood sugar parameters to rehab staff with min assist. Patient will follow up with MD after discharge.  Outcome:  Progressing Goal: RH STG INCREASE KNOWLEDGE OF HYPERTENSION Description Patient will demonstrate knowledge of HTN medications, dietary restrictions, and BP parameters with min assist from rehab staff. Patient will follow up with MD after discharge.   Outcome: Progressing Goal: RH STG INCREASE KNOWLEDGE OF STROKE PROPHYLAXIS Description Patient will demonstrate knowledge of medications used to help prevent future strokes with min assist from rehab staff and follow up with MD after discharge.  Outcome: Progressing

## 2018-06-10 NOTE — Progress Notes (Signed)
PHYSICAL MEDICINE & REHABILITATION PROGRESS NOTE  Subjective/Complaints: Patient seen laying in bed this morning.  He states he slept well overnight.  He states he is working on trying to move his ankle by thinking about it.  ROS: Denies CP, shortness of breath, nausea, vomiting, diarrhea.  Objective: Vital Signs: Blood pressure (!) 143/90, pulse 70, temperature 98 F (36.7 C), temperature source Oral, resp. rate 16, height $RemoveBefor [id number]$5\' 8"found. No results for input(s): WBC, HGB, HCT, PLT in the last 72 hours. No results for input(s): NA, K, CL, CO2, GLUCOSE, BUN, CREATININE, CALCIUM in the last 72 hours.  Physical Exam: BP (!) 143/90 (BP Location: Left Arm)   Pulse 70   Temp 98 F (36.7 C) (Oral)   Resp 16   Ht 5\' 8"  (1.727 m)   Wt 80.8 kg   SpO2 96%   BMI 27.08 kg/m  Constitutional: No distress . Vital signs reviewed. HENT: Normocephalic.  Atraumatic. Eyes: EOMI.  No discharge. Cardiovascular: No JVD. Respiratory: Normal effort. GI: Non-distended. Musc: No edema or tenderness in extremities. Neurological:  Alert and oriented Makes eye contact with examiner.  Dysarthria, unchanged Facial droop, unchanged Follows commands Motor:  RUE: Shoulder abduction 3-/5, distally 3+-4-/5 with apraxia Right lower extremity: Hip flexion, knee extension 4-/5, ankle dorsiflexion 0/5, unchanged Skin: Warm and dry.  Intact. Psych: Normal mood.  Normal behavior.  Assessment/Plan: 1. Functional deficits secondary to bilateral brain infarcts which require 3+ hours per day of interdisciplinary therapy in a comprehensive inpatient rehab setting.  Physiatrist is providing close team supervision and 24 hour management of active medical problems listed below.  Physiatrist and rehab team continue to assess barriers to discharge/monitor patient progress toward functional and medical goals  Care Tool:  Bathing    Body parts bathed by  patient: Chest, Abdomen, Front perineal area, Buttocks, Right upper leg, Left upper leg, Left lower leg, Face, Left arm, Right arm   Body parts bathed by helper: Right lower leg     Bathing assist Assist Level: Minimal Assistance - Patient > 75%     Upper Body Dressing/Undressing Upper body dressing Upper body dressing/undressing activity did not occur (including orthotics): N/A What is the patient wearing?: Pull over shirt    Upper body assist Assist Level: Minimal Assistance - Patient > 75%    Lower Body Dressing/Undressing Lower body dressing      What is the patient wearing?: Pants, Underwear/pull up     Lower body assist Assist for lower body dressing: Maximal Assistance - Patient 25 - 49%     Toileting Toileting Toileting Activity did not occur (Clothing management and hygiene only): Safety/medical concerns  Toileting assist Assist for toileting: Maximal Assistance - Patient 25 - 49%     Transfers Chair/bed transfer  Transfers assist     Chair/bed transfer assist level: Maximal Assistance - Patient 25 - 49% Chair/bed transfer assistive device: Mechanical lift(stedy)   Locomotion Ambulation   Ambulation assist      Assist level: Moderate Assistance - Patient 50 - 74% Assistive device: Walker-rolling(R hand splint & ace wrap R LE) Max distance: 12'   Walk 10 feet activity   Assist     Assist level: Moderate Assistance - Patient - 50 - 74% Assistive device: Walker-rolling, Orthosis(R hand splint & ace wrap R LE)   Walk 50 feet activity   Assist Walk 50 feet with 2 turns activity did not occur: Safety/medical concerns  Walk 150 feet activity   Assist Walk 150 feet activity did not occur: Safety/medical concerns         Walk 10 feet on uneven surface  activity   Assist Walk 10 feet on uneven surfaces activity did not occur: Safety/medical concerns         Wheelchair     Assist   Type of Wheelchair: Manual     Wheelchair assist level: Supervision/Verbal cueing Max wheelchair distance: 150'    Wheelchair 50 feet with 2 turns activity    Assist        Assist Level: Supervision/Verbal cueing   Wheelchair 150 feet activity     Assist Wheelchair 150 feet activity did not occur: Safety/medical concerns   Assist Level: Supervision/Verbal cueing         Medical Problem List and Plan: 1.Right side weakness facial droop with dysarthriasecondary to bilateral punctate infarcts with largest infarct in left leftBG/CR on 05/29/2018. Status post TPA. 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  Continue CIR 2. Antithrombotics: -DVT/anticoagulation:Subcutaneous Lovenox -antiplatelet therapy: Aspirin 81 mg daily, Plavix 75 mg daily x3 months then Plavix alone   CBC within normal limits on 5/15 3. Pain Management:Tylenol as needed 4. Mood:Provide emotional support -antipsychotic agents: N/A 5. Neuropsych: This patientiscapable of making decisions on hisown behalf. 6. Skin/Wound Care:Routine skin checks 7. Fluids/Electrolytes/Nutrition:Routine in and outs  8.Hypertension with questionable medical noncompliance. HCTZ 12.5 mg dailyLopressor 25 mg twice daily, Norvasc 10 mg daily.   ?  Trending up on 5/19, will consider medication adjustments if persistent  Monitor with increased mobility 9.Hyperlipidemia. Lipitor 10.History of alcohol use.  Counseling 11.New findings type 2 diabetes mellitus.Hemoglobin A1c 6.5. SSI. Diabetic teaching  Metformin 250 daily started on 5/19  Monitor with increased mobility 12.  Hyponatremia  Sodium 134 on 5/15, labs ordered for tomorrow  Continue to monitor  LOS: 5 days A FACE TO FACE EVALUATION WAS PERFORMED  Ankit Karis Juba 06/10/2018, 10:10 AM

## 2018-06-10 NOTE — Progress Notes (Signed)
Speech Language Pathology Daily Session Note  Patient Details  Name: Joe Wood MRN: [id number] Date of Birth: 11/01/1950  Today's Date: 06/10/2018 SLP Individual Time: 1300-1355 SLP Individual Time Calculation (min): 55 min  Short Term Goals: Week 1: SLP Short Term Goal 1 (Week 1): Patient will articulate at the phrase level with >95% intelligibility utilizing compensatory strategies (i.e. slow rate, overarticulation, breath support) given moderate cues in order to increase independent communication skills. SLP Short Term Goal 2 (Week 1): Patient will recall 4/4 WRAP memory strategies and utilize at least x1 strategy during a functional structured or unstructured task with 50% accuracy in order to combat memory deficits.  SLP Short Term Goal 3 (Week 1): Patient will complete functional executive function task for home (i.e. financial/schedule/medication management) with 60% accuracy and moderate cues in order to increase functional independence in the home environment. SLP Short Term Goal 4 (Week 1): Patient will complete complex, functional problem solving tasks with 75% accuracy and moderate cues in order to increase safety in the home environment.  Skilled Therapeutic Interventions: Skilled treatment session focused on cognitive goals. SLP facilitated session by providing Mod A verbal cues and extra time during a complex medication management task of organizing a BID pill box. Patient demonstrated decreased recall throughout session and reported he had already eaten lunch, however, the lunch tray was delivered at the beginning of the session. Patient required Min A verbal cues for problem solving for tray set-up but alternated between self-feeding and a functional conversation with 90% intelligibility and overall Mod I. Patient left upright in bed with alarm on and all needs within reach. Continue with current plan of care.      Pain Pain Assessment Pain Scale: 0-10 Pain Score: 0-No  pain  Therapy/Group: Individual Therapy  Palmer Shorey 06/10/2018, 1:56 PM

## 2018-06-10 NOTE — Progress Notes (Signed)
Offered no complaints. Slept well. Remains incontinent/continent of urine and stool.

## 2018-06-10 NOTE — Progress Notes (Signed)
Physical Therapy Session Note  Patient Details  Name: Joe Wood MRN: [id number] Date of Birth: [birth year]  Today's Date: 06/10/2018 PT Individual Time: 1400-1510 PT Individual Time Calculation (min): 70 min   Short Term Goals: Week 1:  PT Short Term Goal 1 (Week 1): Patient to perform functional transfers consistently at mod A. PT Short Term Goal 2 (Week 1): Patient to demonstrate static standing balance with UE support and min A.  PT Short Term Goal 3 (Week 1): Patient to demonstrate static sitting balance with S.  PT Short Term Goal 4 (Week 1): Patient to ambulate 58' with LRAD and mod A.  Skilled Therapeutic Interventions/Progress Updates:    pt in bed, agreeable to therapy.  Squat pivot transfers with mod A throughout session due pt requiring mod manual facilitation for wt shifts, cues for motor planning.  W/c mobility with hemi technique x 150' with supervision.  Standing NMR with sit <> Stand, mini squats, Lt toe taps, LT sidestep and fwd/backward stepping all with focus on Rt LE stance control with mod/max A, cues for posture and to decrease Rt knee hyperextension.  Gait with RW 2 x 15' with max A for Rt LE advancement, safety and balance with turning. Gait at railing in hallway with much improved posture and Rt LE control.  Seated Rt knee flex/ext on towel with tactile cues and max facilitation.  Pt left in bed with needs at hand, alarm set.  Therapy Documentation Precautions:  Precautions Precautions: Fall Precaution Comments: R lean and decreased R side awareness Restrictions Weight Bearing Restrictions: No Pain:  no c/o pain   Therapy/Group: Individual Therapy  Loreto Loescher 06/10/2018, 3:23 PM

## 2018-06-11 ENCOUNTER — Inpatient Hospital Stay (HOSPITAL_COMMUNITY): Payer: Medicare Other | Admitting: Occupational Therapy

## 2018-06-11 ENCOUNTER — Inpatient Hospital Stay (HOSPITAL_COMMUNITY): Payer: Medicare Other | Admitting: Physical Therapy

## 2018-06-11 ENCOUNTER — Inpatient Hospital Stay (HOSPITAL_COMMUNITY): Payer: Medicare Other | Admitting: Speech Pathology

## 2018-06-11 DIAGNOSIS — R7309 Other abnormal glucose: Secondary | ICD-10-CM

## 2018-06-11 LAB — GLUCOSE, CAPILLARY
Glucose-Capillary: 187 mg/dL — ABNORMAL HIGH (ref 70–99)
Glucose-Capillary: 154 mg/dL — ABNORMAL HIGH (ref 70–99)
Glucose-Capillary: 121 mg/dL — ABNORMAL HIGH (ref 70–99)
Glucose-Capillary: 137 mg/dL — ABNORMAL HIGH (ref 70–99)

## 2018-06-11 LAB — BASIC METABOLIC PANEL
Anion gap: 12 (ref 5–15)
BUN: 19 mg/dL (ref 8–23)
CO2: 26 mmol/L (ref 22–32)
Calcium: 10.3 mg/dL (ref 8.9–10.3)
Chloride: 96 mmol/L — ABNORMAL LOW (ref 98–111)
Creatinine, Ser: 1.14 mg/dL (ref 0.61–1.24)
GFR calc Af Amer: >60 mL/min (ref >60)
GFR calc non Af Amer: >60 mL/min (ref >60)
Glucose, Bld: 186 mg/dL — ABNORMAL HIGH (ref 70–99)
Potassium: 4.2 mmol/L (ref 3.5–5.1)
Sodium: 134 mmol/L — ABNORMAL LOW (ref 135–145)

## 2018-06-11 MED ORDER — METOPROLOL TARTRATE 25 MG PO TABS
12.5000 mg | ORAL_TABLET | Freq: Once | ORAL | Status: AC
  Administered 2018-06-11: 10:00:00 12.5 mg via ORAL
  Filled 2018-06-11: qty 1

## 2018-06-11 MED ORDER — METOPROLOL TARTRATE 25 MG PO TABS
37.5000 mg | ORAL_TABLET | Freq: Two times a day (BID) | ORAL | Status: DC
  Administered 2018-06-11 – 2018-06-27 (×32): 37.5 mg via ORAL
  Filled 2018-06-11 (×33): qty 2

## 2018-06-11 NOTE — Progress Notes (Signed)
Occupational Therapy Session Note  Patient Details  Name: Joe Wood MRN: [id number] Date of Birth: 1950-02-24  Today's Date: 06/11/2018 OT Individual Time: 7741-2878 OT Individual Time Calculation (min): 45 min    Short Term Goals: Week 1:  OT Short Term Goal 1 (Week 1): Pt will be able to sit safely on tub bench with S to be able to bathe LB with mod A. OT Short Term Goal 2 (Week 1): Pt will be able to transfer to toilet with mod A of 1.  OT Short Term Goal 3 (Week 1): Pt will demonstrate improved R side awareness to don shirt over R arm with min cues.  OT Short Term Goal 4 (Week 1): Pt will demonstrate improved motor planning to don shirt with min A and min cues.  OT Short Term Goal 5 (Week 1): Pt will be able to sit to stand for clothing management with mod A.   Skilled Therapeutic Interventions/Progress Updates:    Treatment session with focus on ADL retraining, functional transfers, dynamic standing balance, and functional use of RUE.  Pt received supine in bed reporting just waking.  Pt declined shower this session, but agreeable to bathing at sink.  Completed bed mobility with min assist for sequencing and squat pivot transfer mod assist to w/c.  Engaged in bathing at sink with hand over hand assist to incorporate RUE in to bathing.  Educated on increased functional use of RUE when reaching for items and functional use of hand when completing grooming tasks.  Pt demonstrated sit > stand with min-mod assist, pt demonstrating increased Rt lean this session with increased difficulty in correcting lean.  Mod cues for hemi-dressing technique.  Pt able to maintain standing at sink with RUE on sink while attempting to pull pants over hips, required assist to pull pants over Rt hip.  Pt able to don Lt sock with increased time, unable to achieve figure 4 position for donning Rt sock.  Pt remained upright in w/c with seat belt alarm on, call bell in reach, and breakfast tray setup.  RN arriving  to provide AM meds.  Therapy Documentation Precautions:  Precautions Precautions: Fall Precaution Comments: R lean and decreased R side awareness Restrictions Weight Bearing Restrictions: No Pain: Pain Assessment Pain Scale: 0-10 Pain Score: 0-No pain   Therapy/Group: Individual Therapy  Joe Wood 06/11/2018, 12:27 PM

## 2018-06-11 NOTE — Plan of Care (Signed)
  Problem: Consults Goal: RH STROKE PATIENT EDUCATION Description See Patient Education module for education specifics  Outcome: Progressing Goal: Diabetes Guidelines if Diabetic/Glucose > 140 Description If diabetic or lab glucose is > 140 mg/dl - Initiate Diabetes/Hyperglycemia Guidelines & Document Interventions  Outcome: Progressing   Problem: RH BOWEL ELIMINATION Goal: RH STG MANAGE BOWEL WITH ASSISTANCE Description STG Manage Bowel with Mod I Assistance.  Outcome: Progressing Goal: RH STG MANAGE BOWEL W/MEDICATION W/ASSISTANCE Description STG Manage Bowel with Medication with Min Assistance.  Outcome: Progressing   Problem: RH BLADDER ELIMINATION Goal: RH STG MANAGE BLADDER WITH ASSISTANCE Description STG Manage Bladder With Mod I Assistance  Outcome: Progressing Goal: RH STG MANAGE BLADDER WITH MEDICATION WITH ASSISTANCE Description STG Manage Bladder With Medication With Min Assistance.  Outcome: Progressing   Problem: RH SKIN INTEGRITY Goal: RH STG MAINTAIN SKIN INTEGRITY WITH ASSISTANCE Description STG Maintain Skin Integrity With Mod Assistance.  Outcome: Progressing   Problem: RH SAFETY Goal: RH STG ADHERE TO SAFETY PRECAUTIONS W/ASSISTANCE/DEVICE Description STG Adhere to Safety Precautions With Mod Assistance and appropriate assistive Device.  Outcome: Progressing   Problem: RH PAIN MANAGEMENT Goal: RH STG PAIN MANAGED AT OR BELOW PT'S PAIN GOAL Description <3 on a 0-10 pain scale  Outcome: Progressing   Problem: RH KNOWLEDGE DEFICIT Goal: RH STG INCREASE KNOWLEDGE OF DIABETES Description Patient will demonstrate knowledge on diabetes medications, dietary restrictions, and blood sugar parameters to rehab staff with min assist. Patient will follow up with MD after discharge.  Outcome: Progressing Goal: RH STG INCREASE KNOWLEDGE OF HYPERTENSION Description Patient will demonstrate knowledge of HTN medications, dietary restrictions, and BP parameters  with min assist from rehab staff. Patient will follow up with MD after discharge.   Outcome: Progressing Goal: RH STG INCREASE KNOWLEDGE OF STROKE PROPHYLAXIS Description Patient will demonstrate knowledge of medications used to help prevent future strokes with min assist from rehab staff and follow up with MD after discharge.  Outcome: Progressing

## 2018-06-11 NOTE — Patient Care Conference (Signed)
Inpatient RehabilitationTeam Conference and Plan of Care Update Date: 06/11/2018   Time: 2:10 PM    Patient Name: Joe Wood      Medical Record Number: [id number]  Date of Birth: 12/05/1950 Sex: Male         Room/Bed: 5N14C/5N14C-01 Payor Info: Payor: [employer] MEDICARE / Plan: [employer] MEDICARE / Product Type: *No Product type* /    Admitting Diagnosis: Gen Team  Lt CVA; 23-24days  Admit Date/Time:  06/05/2018  3:55 PM Admission Comments: No comment available   Primary Diagnosis:  <principal problem not specified> Principal Problem: <principal problem not specified>  Patient Active Problem List   Diagnosis Date Noted  . Labile blood glucose   . Cerebellar stroke, acute (HCC)   . Acute cerebral infarction (HCC)   . Hyponatremia   . Diabetes mellitus, new onset (HCC)   . Essential hypertension   . History of alcohol abuse   . Basal ganglia stroke (HCC) 06/05/2018  . 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 Hypertensive emergency 06/04/2018  . Hyperlipidemia LDL goal <70 06/04/2018  . Diabetes mellitus, type II (HCC) 06/04/2018  . Alcohol abuse 06/04/2018  . Acute ischemic stroke [facility](HCC) - s/p tPA, d/t large vessel disease 05/29/2018    Expected Discharge Date: Expected Discharge Date: 06/27/18  Team Members Present: Physician leading conference: Dr. Maryla MorrowAnkit Patel Social Worker Present: Dossie DerBecky Yasheka Fossett, LCSW Nurse Present: Other (comment);Allayne Stackhelsey Evans, RN(Latre Adamah-RN) PT Present: Judieth KeensKaren Donaworth, PT OT Present: Rosalio LoudSarah Hoxie, OT SLP Present: Feliberto Gottronourtney Payne, SLP PPS Coordinator present : Edson SnowballBecky Windsor, PT     Current Status/Progress Goal Weekly Team Focus  Medical   Functional deficits and weakness secondary to debility and encephalopathy after urosepsis/pancreatitis and multiple medical issues  Improve mobility, BP/DM, electrolytes  See above   Bowel/Bladder   uses urinal, incontinent of bladder at times, continent of bowel, LBM5/18  restore continence    assess q shift and as needed   Swallow/Nutrition/ Hydration             ADL's   mod assist bathing, min assist UB dressing, max assist LB dressing, mod assist transfers  min A dynamic stand balance, LB self care, toilet and tub shower transfer; S with cognition and UB self care  ADL retraining, transfers, sit > stand, dynamic standing balance, RUE NMR and awareness   Mobility   min A transfers, mod/max A gait  min A gait, supervision w/c level  NMR, balance, gait   Communication   Supervision   Mod I   use of intelligibility strategies at the sentence level    Safety/Cognition/ Behavioral Observations  Mod A   Min A   complex problem solving and recall with use of strategies    Pain   no complaints of pain  remain pain free   assess q shift and as needed   Skin   no current skin issues  remain intact and free of infection   assess q shift and as needed      *See Care Plan and progress notes for long and short-term goals.     Barriers to Discharge  Current Status/Progress Possible Resolutions Date Resolved   Physician    Medical stability     See above  Therapies, optimize DM/HTN meds, follow labs      Nursing                  PT  OT                  SLP                SW                Discharge Planning/Teaching Needs:  HOme with wife who can assist, will need family training prior to DC. Pt is making progress in therapies.      Team Discussion:  Goals in assist with ambulation and supervision wheelchair level. Working on balance and awareness and making progress in therapies. MD checking labs. Currently min-mod level of assist. occassional bladder incontinence  Revisions to Treatment Plan:  DC 6/5    Continued Need for Acute Rehabilitation Level of Care: The patient requires daily medical management by a physician with specialized training in physical medicine and rehabilitation for the following conditions: Daily direction of a  multidisciplinary physical rehabilitation program to ensure safe treatment while eliciting the highest outcome that is of practical value to the patient.: Yes Daily medical management of patient stability for increased activity during participation in an intensive rehabilitation regime.: Yes Daily analysis of laboratory values and/or radiology reports with any subsequent need for medication adjustment of medical intervention for : Neurological problems;Diabetes problems;Blood pressure problems   I attest that I was present, lead the team conference, and concur with the assessment and plan of the team.   Lucy Chris 06/11/2018, 4:27 PM

## 2018-06-11 NOTE — Progress Notes (Signed)
Occupational Therapy Session Note  Patient Details  Name: MAZEN PHO MRN: [id number] Date of Birth: [birth year]  Today's Date: 06/11/2018 OT Individual Time: 1115-1200 OT Individual Time Calculation (min): 45 min    Short Term Goals: Week 1:  OT Short Term Goal 1 (Week 1): Pt will be able to sit safely on tub bench with S to be able to bathe LB with mod A. OT Short Term Goal 2 (Week 1): Pt will be able to transfer to toilet with mod A of 1.  OT Short Term Goal 3 (Week 1): Pt will demonstrate improved R side awareness to don shirt over R arm with min cues.  OT Short Term Goal 4 (Week 1): Pt will demonstrate improved motor planning to don shirt with min A and min cues.  OT Short Term Goal 5 (Week 1): Pt will be able to sit to stand for clothing management with mod A.  Week 2:     Skilled Therapeutic Interventions/Progress Updates:    1:1 When arrived pt had just called for assistance. Pt's clothes were soaked and there was a puddle of urine on the floor in front of him. Transported pt into the bathroom. Performed min A stand pivot w/c to 3:1 in shower with use of grab bar. Pt bathed with overall mod A (requiring A for lower LEs, back and left UE. Pt demonstrated initiation use of right hand but required A for follow though-more movement and strength distally than proximally. Min A stand pivot back to w/c. Pt performed dressing sit to stand at sink. Pt required cues and A to perform hemi dressing techniques for LB. Min  A for UB dressing and max A for LB.   Therapy Documentation Precautions:  Precautions Precautions: Fall Precaution Comments: R lean and decreased R side awareness Restrictions Weight Bearing Restrictions: No Pain:  no c/o pain in session   Therapy/Group: Individual Therapy  Roney Mans [facility] 06/11/2018, 7:06 PM

## 2018-06-11 NOTE — Progress Notes (Signed)
Physical Therapy Session Note  Patient Details  Name: COURVOISIER VREDEVELD MRN: [id number] Date of Birth: 25-Feb-1950  Today's Date: 06/11/2018 PT Individual Time: 1435-1515 PT Individual Time Calculation (min): 40 min   Short Term Goals: Week 1:  PT Short Term Goal 1 (Week 1): Patient to perform functional transfers consistently at mod A. PT Short Term Goal 2 (Week 1): Patient to demonstrate static standing balance with UE support and min A.  PT Short Term Goal 3 (Week 1): Patient to demonstrate static sitting balance with S.  PT Short Term Goal 4 (Week 1): Patient to ambulate 77' with LRAD and mod A.  Skilled Therapeutic Interventions/Progress Updates:    pt asleep, arouses to light touch and voice. Pt fatigued but agreeable to therapy.  Pt mod A with squat pivot transfers throughout session.  W/c mobility with hemi technique with supervision 100' x 2.  Standing balance with reaching and squatting tasks all directions with focus on Rt LE control with min/mod manual facilitation at hip and knee.  Gait at railing in hallway with mod A for Rt LE advancement, Rt hip anterior translation, stance phase control.   Therapy Documentation Precautions:  Precautions Precautions: Fall Precaution Comments: R lean and decreased R side awareness Restrictions Weight Bearing Restrictions: No Pain:  no c/o pain   Therapy/Group: Individual Therapy  Hersey Maclellan 06/11/2018, 3:28 PM

## 2018-06-11 NOTE — Progress Notes (Signed)
[facility] PHYSICAL MEDICINE & REHABILITATION PROGRESS NOTE  Subjective/Complaints: Patient seen laying in bed this morning.  He states he slept well overnight.  He denies complaints.  ROS: Denies CP, shortness of breath, nausea, vomiting, diarrhea.  Objective: Vital Signs: Blood pressure (!) 166/97, pulse 75, temperature 98.2 F (36.8 C), temperature source Oral, resp. rate 15, height 5\' 8"  (1.727 m), weight 81.2 kg, SpO2 98 %. No results found. No results for input(s): WBC, HGB, HCT, PLT in the last 72 hours. Recent Labs    06/11/18 0515  NA 134*  K 4.2  CL 96*  CO2 26  GLUCOSE 186*  BUN 19  CREATININE 1.14  CALCIUM 10.3    Physical Exam: BP (!) 166/97 (BP Location: Left Arm)   Pulse 75   Temp 98.2 F (36.8 C) (Oral)   Resp 15   Ht 5\' 8"  (1.727 m)   Wt 81.2 kg   SpO2 98%   BMI 27.23 kg/m  Constitutional: No distress . Vital signs reviewed. HENT: Normocephalic.  Atraumatic. Eyes: EOMI. No discharge. Cardiovascular: No JVD. Respiratory: Normal effort. GI: Non-distended. Musc: No edema or tenderness in extremities. Neurological:  Alert and oriented Makes eye contact with examiner.  Dysarthria, unchanged Facial droop, stable Follows commands Motor:  RUE: Shoulder abduction 3-/5, distally 3+-4-/5 with apraxia, unchanged Right lower extremity: Hip flexion, knee extension 4-/5, ankle dorsiflexion 0/5, unchanged Skin: Warm and dry.  Intact. Psych: Normal mood.  Normal behavior.  Assessment/Plan: 1. Functional deficits secondary to bilateral brain infarcts which require 3+ hours per day of interdisciplinary therapy in a comprehensive inpatient rehab setting.  Physiatrist is providing close team supervision and 24 hour management of active medical problems listed below.  Physiatrist and rehab team continue to assess barriers to discharge/monitor patient progress toward functional and medical goals  Care Tool:  Bathing    Body parts bathed by patient:  Chest, Abdomen, Front perineal area, Right upper leg, Left upper leg, Left lower leg, Face, Right arm   Body parts bathed by helper: Left arm, Buttocks     Bathing assist Assist Level: Moderate Assistance - Patient 50 - 74%     Upper Body Dressing/Undressing Upper body dressing Upper body dressing/undressing activity did not occur (including orthotics): N/A What is the patient wearing?: Pull over shirt    Upper body assist Assist Level: Minimal Assistance - Patient > 75%    Lower Body Dressing/Undressing Lower body dressing      What is the patient wearing?: Pants, Incontinence brief     Lower body assist Assist for lower body dressing: Maximal Assistance - Patient 25 - 49%     Toileting Toileting Toileting Activity did not occur (Clothing management and hygiene only): Safety/medical concerns  Toileting assist Assist for toileting: Maximal Assistance - Patient 25 - 49%     Transfers Chair/bed transfer  Transfers assist     Chair/bed transfer assist level: Maximal Assistance - Patient 25 - 49% Chair/bed transfer assistive device: Mechanical lift(stedy)   Locomotion Ambulation   Ambulation assist      Assist level: Moderate Assistance - Patient 50 - 74% Assistive device: Walker-rolling(R hand splint & ace wrap R LE) Max distance: 12'   Walk 10 feet activity   Assist     Assist level: Moderate Assistance - Patient - 50 - 74% Assistive device: Walker-rolling, Orthosis(R hand splint & ace wrap R LE)   Walk 50 feet activity   Assist Walk 50 feet with 2 turns activity did not occur: Safety/medical  concerns         Walk 150 feet activity   Assist Walk 150 feet activity did not occur: Safety/medical concerns         Walk 10 feet on uneven surface  activity   Assist Walk 10 feet on uneven surfaces activity did not occur: Safety/medical concerns         Wheelchair     Assist   Type of Wheelchair: Manual    Wheelchair assist level:  Supervision/Verbal cueing Max wheelchair distance: 150'    Wheelchair 50 feet with 2 turns activity    Assist        Assist Level: Supervision/Verbal cueing   Wheelchair 150 feet activity     Assist Wheelchair 150 feet activity did not occur: Safety/medical concerns   Assist Level: Supervision/Verbal cueing         Medical Problem List and Plan: 1.Right side weakness facial droop with dysarthriasecondary to bilateral punctate infarcts with largest infarct in left leftBG/CR on 05/29/2018. Status post TPA. 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  Continue CIR  Team conference today to discuss current and goals and coordination of care, home and environmental barriers, and discharge planning with nursing, case manager, and therapies.  2. Antithrombotics: -DVT/anticoagulation:Subcutaneous Lovenox -antiplatelet therapy: Aspirin 81 mg daily, Plavix 75 mg daily x3 months then Plavix alone   CBC within normal limits on 5/15 3. Pain Management:Tylenol as needed 4. Mood:Provide emotional support -antipsychotic agents: N/A 5. Neuropsych: This patientiscapable of making decisions on hisown behalf. 6. Skin/Wound Care:Routine skin checks 7. Fluids/Electrolytes/Nutrition:Routine in and outs  8.Hypertension with questionable medical noncompliance. HCTZ 12.5 mg daily  Lopressor 25 mg twice daily, increased to 37.5 twice daily on 5/20  Norvasc 10 mg daily.   Monitor with increased mobility 9.Hyperlipidemia. Lipitor 10.History of alcohol use.  Counseling 11.New findings type 2 diabetes mellitus.Hemoglobin A1c 6.5. SSI. Diabetic teaching  Metformin 250 daily started on 5/19  Remained labile and elevated on 5/20  Monitor with increased mobility 12.  Hyponatremia  Sodium 134 on 5/20  Continue to monitor  LOS: 6 days A FACE TO FACE EVALUATION WAS PERFORMED   Joe Wood 06/11/2018, 9:40  AM

## 2018-06-11 NOTE — Progress Notes (Signed)
Speech Language Pathology Daily Session Note  Patient Details  Name: Joe Wood MRN: [id number] Date of Birth: Feb 17, 1950  Today's Date: 06/11/2018 SLP Individual Time: 1000-1025 SLP Individual Time Calculation (min): 25 min  Short Term Goals: Week 1: SLP Short Term Goal 1 (Week 1): Patient will articulate at the phrase level with >95% intelligibility utilizing compensatory strategies (i.e. slow rate, overarticulation, breath support) given moderate cues in order to increase independent communication skills. SLP Short Term Goal 2 (Week 1): Patient will recall 4/4 WRAP memory strategies and utilize at least x1 strategy during a functional structured or unstructured task with 50% accuracy in order to combat memory deficits.  SLP Short Term Goal 3 (Week 1): Patient will complete functional executive function task for home (i.e. financial/schedule/medication management) with 60% accuracy and moderate cues in order to increase functional independence in the home environment. SLP Short Term Goal 4 (Week 1): Patient will complete complex, functional problem solving tasks with 75% accuracy and moderate cues in order to increase safety in the home environment.  Skilled Therapeutic Interventions: Skilled treatment session focused on cognitive goals. SLP facilitated session by providing Min A verbal cues for problem solving and Mod A verbal cues for recall of procedures during a novel card task. Throughout session, patient was 100% intelligible at the sentence level. Patient left upright in wheelchair with alarm on and all needs within reach. Continue with current plan of care.      Pain No/Denies Pain   Therapy/Group: Individual Therapy  Joe Wood 06/11/2018, 12:55 PM

## 2018-06-12 ENCOUNTER — Inpatient Hospital Stay (HOSPITAL_COMMUNITY): Payer: Medicare Other

## 2018-06-12 ENCOUNTER — Inpatient Hospital Stay (HOSPITAL_COMMUNITY): Payer: Medicare Other | Admitting: Occupational Therapy

## 2018-06-12 ENCOUNTER — Inpatient Hospital Stay (HOSPITAL_COMMUNITY): Payer: Medicare Other | Admitting: Speech Pathology

## 2018-06-12 DIAGNOSIS — R0989 Other specified symptoms and signs involving the circulatory and respiratory systems: Secondary | ICD-10-CM

## 2018-06-12 LAB — CREATININE, SERUM
Creatinine, Ser: 1.21 mg/dL (ref 0.61–1.24)
GFR calc Af Amer: >60 mL/min (ref >60)
GFR calc non Af Amer: >60 mL/min (ref >60)

## 2018-06-12 LAB — GLUCOSE, CAPILLARY
Glucose-Capillary: 172 mg/dL — ABNORMAL HIGH (ref 70–99)
Glucose-Capillary: 134 mg/dL — ABNORMAL HIGH (ref 70–99)
Glucose-Capillary: 119 mg/dL — ABNORMAL HIGH (ref 70–99)
Glucose-Capillary: 146 mg/dL — ABNORMAL HIGH (ref 70–99)

## 2018-06-12 NOTE — Progress Notes (Signed)
Social Work Patient ID: Cletus Gash, male   DOB: Mar 22, 1950, 68 y.o.   MRN: [id number] Met with pt and spoke with wife via telephone to discuss team conference goals-mi assist level and discharge target 6/5. Wife is very pleased with our staff and being able to call at any time to get an update on pt. Made her aware of the need to come in for education prior to pt going home. She will be able to do this. Will work toward discharge needs.

## 2018-06-12 NOTE — Progress Notes (Signed)
Speech Language Pathology Daily Session Note  Patient Details  Name: ZAMEER AWWAD MRN: [id number] Date of Birth: [birth year]  Today's Date: 06/12/2018 SLP Individual Time: 1916-6060 SLP Individual Time Calculation (min): 55 min  Short Term Goals: Week 1: SLP Short Term Goal 1 (Week 1): Patient will articulate at the phrase level with >95% intelligibility utilizing compensatory strategies (i.e. slow rate, overarticulation, breath support) given moderate cues in order to increase independent communication skills. SLP Short Term Goal 2 (Week 1): Patient will recall 4/4 WRAP memory strategies and utilize at least x1 strategy during a functional structured or unstructured task with 50% accuracy in order to combat memory deficits.  SLP Short Term Goal 3 (Week 1): Patient will complete functional executive function task for home (i.e. financial/schedule/medication management) with 60% accuracy and moderate cues in order to increase functional independence in the home environment. SLP Short Term Goal 4 (Week 1): Patient will complete complex, functional problem solving tasks with 75% accuracy and moderate cues in order to increase safety in the home environment.  Skilled Therapeutic Interventions: Skilled treatment session focused on cognitive goals. SLP facilitated session by providing Max A verbal cues for recall of procedures to a previously learned task and Min A verbal cues for problem solving with complex, novel task. SLP also facilitated session by providing Max A multimodal cues for problem solving with utilization of schedule to anticipate upcoming therapy appointments, time, etc. However, patient only required Mod verbal cues to verbalize goals of skilled intervention amongst all therapies. Patient left upright in bed with alarm on and all needs within reach. Continue with current plan of care.      Pain No/Denies Pain   Therapy/Group: Individual Therapy  Plumer Mittelstaedt 06/12/2018, 1:37  PM

## 2018-06-12 NOTE — Progress Notes (Signed)
[facility] PHYSICAL MEDICINE & REHABILITATION PROGRESS NOTE  Subjective/Complaints: Patient seen laying in bed this morning.  He states he slept well overnight.    ROS: Denies CP, shortness of breath, nausea, vomiting, diarrhea.  Objective: Vital Signs: Blood pressure (!) 150/80, pulse 98, temperature 98 F (36.7 C), temperature source Oral, resp. rate 16, height 5\' 8"  (1.727 m), weight 81.2 kg, SpO2 96 %. No results found. No results for input(s): WBC, HGB, HCT, PLT in the last 72 hours. Recent Labs    06/11/18 0515 06/12/18 0026  NA 134*  --   K 4.2  --   CL 96*  --   CO2 26  --   GLUCOSE 186*  --   BUN 19  --   CREATININE 1.14 1.21  CALCIUM 10.3  --     Physical Exam: BP (!) 150/80 (BP Location: Left Arm)   Pulse 98   Temp 98 F (36.7 C) (Oral)   Resp 16   Ht 5\' 8"  (1.727 m)   Wt 81.2 kg   SpO2 96%   BMI 27.23 kg/m  Constitutional: No distress . Vital signs reviewed. HENT: Normocephalic.  Atraumatic. Eyes: EOMI.  No discharge.   Cardiovascular: No JVD. Respiratory: Normal effort. GI: Non-distended. Musc: No edema or tenderness in extremities. Neurological:  Alert and oriented Makes eye contact with examiner.  Dysarthria, stable Facial droop, stable Follows commands Motor:  RUE: Shoulder abduction 3-/5, distally 3+-4-/5 with apraxia, stable Right lower extremity: Hip flexion, knee extension 4-/5, ankle dorsiflexion 0/5, stable Skin: Warm and dry.  Intact. Psych: Normal mood.  Normal behavior.  Assessment/Plan: 1. Functional deficits secondary to bilateral brain infarcts which require 3+ hours per day of interdisciplinary therapy in a comprehensive inpatient rehab setting.  Physiatrist is providing close team supervision and 24 hour management of active medical problems listed below.  Physiatrist and rehab team continue to assess barriers to discharge/monitor patient progress toward functional and medical goals  Care Tool:  Bathing    Body parts  bathed by patient: Chest, Abdomen, Front perineal area, Right upper leg, Left upper leg, Left lower leg, Face, Right arm, Right lower leg   Body parts bathed by helper: Left arm, Buttocks     Bathing assist Assist Level: Moderate Assistance - Patient 50 - 74%     Upper Body Dressing/Undressing Upper body dressing Upper body dressing/undressing activity did not occur (including orthotics): N/A What is the patient wearing?: Pull over shirt    Upper body assist Assist Level: Minimal Assistance - Patient > 75%    Lower Body Dressing/Undressing Lower body dressing      What is the patient wearing?: Pants, Incontinence brief     Lower body assist Assist for lower body dressing: Moderate Assistance - Patient 50 - 74%     Toileting Toileting Toileting Activity did not occur Press photographer(Clothing management and hygiene only): Safety/medical concerns  Toileting assist Assist for toileting: Maximal Assistance - Patient 25 - 49%     Transfers Chair/bed transfer  Transfers assist     Chair/bed transfer assist level: Moderate Assistance - Patient 50 - 74% Chair/bed transfer assistive device: Armrests   Locomotion Ambulation   Ambulation assist      Assist level: Moderate Assistance - Patient 50 - 74% Assistive device: Walker-rolling(R handsplint and ACE wrap on RLE) Max distance: 30'   Walk 10 feet activity   Assist     Assist level: Moderate Assistance - Patient - 50 - 74% Assistive device: Walker-rolling, Orthosis  Walk 50 feet activity   Assist Walk 50 feet with 2 turns activity did not occur: Safety/medical concerns         Walk 150 feet activity   Assist Walk 150 feet activity did not occur: Safety/medical concerns         Walk 10 feet on uneven surface  activity   Assist Walk 10 feet on uneven surfaces activity did not occur: Safety/medical concerns         Wheelchair     Assist   Type of Wheelchair: Manual    Wheelchair assist level:  Supervision/Verbal cueing Max wheelchair distance: 51'    Wheelchair 50 feet with 2 turns activity    Assist        Assist Level: Supervision/Verbal cueing   Wheelchair 150 feet activity     Assist Wheelchair 150 feet activity did not occur: Safety/medical concerns   Assist Level: Supervision/Verbal cueing         Medical Problem List and Plan: 1.Right side weakness facial droop with dysarthriasecondary to bilateral punctate infarcts with largest infarct in left leftBG/CR on 05/29/2018. Status post TPA. 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  Continue CIR 2. Antithrombotics: -DVT/anticoagulation:Subcutaneous Lovenox  Creatinine within normal is on 5/21 -antiplatelet therapy: Aspirin 81 mg daily, Plavix 75 mg daily x3 months then Plavix alone   CBC within normal limits on 5/15 3. Pain Management:Tylenol as needed 4. Mood:Provide emotional support -antipsychotic agents: N/A 5. Neuropsych: This patientiscapable of making decisions on hisown behalf. 6. Skin/Wound Care:Routine skin checks 7. Fluids/Electrolytes/Nutrition:Routine in and outs  8.Hypertension with questionable medical noncompliance. HCTZ 12.5 mg daily  Lopressor 25 mg twice daily, increased to 37.5 twice daily on 5/20  Norvasc 10 mg daily.   Labile on 5/21  Monitor with increased mobility 9.Hyperlipidemia. Lipitor 10.History of alcohol use.  Counseling 11.New findings type 2 diabetes mellitus.Hemoglobin A1c 6.5. SSI. Diabetic teaching  Metformin 250 daily started on 5/19  Labile on 5/21  Monitor with increased mobility 12.  Hyponatremia  Sodium 134 on 5/20  Continue to monitor  LOS: 7 days A FACE TO FACE EVALUATION WAS PERFORMED  Ankit Karis Juba 06/12/2018, 1:55 PM

## 2018-06-12 NOTE — Progress Notes (Signed)
Physical Therapy Session Note  Patient Details  Name: Joe Wood MRN: [id number] Date of Birth: September 02, 1950  Today's Date: 06/12/2018 PT Individual Time: 9292-4462 PT Individual Time Calculation (min): 60 min   Short Term Goals: Week 1:  PT Short Term Goal 1 (Week 1): Patient to perform functional transfers consistently at mod A. PT Short Term Goal 2 (Week 1): Patient to demonstrate static standing balance with UE support and min A.  PT Short Term Goal 3 (Week 1): Patient to demonstrate static sitting balance with S.  PT Short Term Goal 4 (Week 1): Patient to ambulate 75' with LRAD and mod A.  Skilled Therapeutic Interventions/Progress Updates:    Session focused on NMR to address trunk and postural control for reorientation to midline in seated and standing positions and during transitional movement:  sit <> stands with focus on technique and maintaining postural control and reorienting to midline (mod assist progressing to min with repetition), bed mobility on flat bed with focus on on technique and activation of RUE musculature to assist with sidelying to sit, supine bridging (x 2 sets of 10 reps) for strengthening and NMR, supine trunk rotation x 10 reps each direction with hold for stretch, dynamic sitting balance and pelvic mobility as well as addressing trunk elongation (R) and shortening during reaching activities, and stand pivot and squat pivot transfers with mod assist and facilitation for weigthshfiting and to maintain balance. W/c mobility training with hemi technique with supervision for functional moblity x 50' and with BLE for reciprocal movement pattern retraining in preparation for gait. NMR during gait training with RW and R hand orthosis and R ACE wrap x 30' with overall mod assist with facilitation for weigthshift - decreased stance time on R, R knee hyperextension noted, and narrow BOS - requires physical assist with RLE placement 50% of the time. Would benefit from trial  AFO. Spoke with wife over phone at end of session and she plans to bring shoes to drop off tomorrow to trial.   Therapy Documentation Precautions:  Precautions Precautions: Fall Precaution Comments: R lean and decreased R side awareness Restrictions Weight Bearing Restrictions: No   Pain: Pain Assessment Pain Scale: 0-10 Pain Score: 0-No pain    Therapy/Group: Individual Therapy  Karolee Stamps Darrol Poke, PT, DPT, CBIS  06/12/2018, 12:07 PM

## 2018-06-12 NOTE — Progress Notes (Signed)
Occupational Therapy Session Note  Patient Details  Name: Joe Wood MRN: [id number] Date of Birth: Apr 24, 1950  Today's Date: 06/12/2018 OT Individual Time: 1420-1505 OT Individual Time Calculation (min): 45 min    Short Term Goals: Week 1:  OT Short Term Goal 1 (Week 1): Pt will be able to sit safely on tub bench with S to be able to bathe LB with mod A. OT Short Term Goal 2 (Week 1): Pt will be able to transfer to toilet with mod A of 1.  OT Short Term Goal 3 (Week 1): Pt will demonstrate improved R side awareness to don shirt over R arm with min cues.  OT Short Term Goal 4 (Week 1): Pt will demonstrate improved motor planning to don shirt with min A and min cues.  OT Short Term Goal 5 (Week 1): Pt will be able to sit to stand for clothing management with mod A.   Skilled Therapeutic Interventions/Progress Updates:    Treatment session with focus on dynamic standing balance and RUE NMR.  Pt received supine in bed reporting fatigue but agreeable to therapy session.  Completed squat pivot transfer mod assist to w/c.  Engaged in sit > stand at high counter with focus on anterior weight shift and obtaining and maintaining midline standing.  Attempted to engage in reaching with RUE, however unable to maintain standing and reaching despite mod-max assist.  Engaged in RUE NMR in sitting with focus on increased shoulder flexion and elbow extension during reaching.  Therapist facilitating increased ROM during reaching with tactile cues/facilitation at elbow.  Engaged in [place] with dowel in BUE to address symmetrical movements during elbow extension.  Therapist again facilitating at elbow for increased activation and symmetry.  Returned to room and transferred back to bed, where pt was left reclined with all needs in reach.  Therapy Documentation Precautions:  Precautions Precautions: Fall Precaution Comments: R lean and decreased R side awareness Restrictions Weight Bearing Restrictions:  Yes Pain:  Pt with no c/o pain   Therapy/Group: Individual Therapy  Rosalio Loud 06/12/2018, 3:37 PM

## 2018-06-13 ENCOUNTER — Inpatient Hospital Stay (HOSPITAL_COMMUNITY): Payer: Medicare Other | Admitting: Physical Therapy

## 2018-06-13 ENCOUNTER — Inpatient Hospital Stay (HOSPITAL_COMMUNITY): Payer: Medicare Other | Admitting: Speech Pathology

## 2018-06-13 ENCOUNTER — Inpatient Hospital Stay (HOSPITAL_COMMUNITY): Payer: Medicare Other | Admitting: Occupational Therapy

## 2018-06-13 LAB — GLUCOSE, CAPILLARY
Glucose-Capillary: 142 mg/dL — ABNORMAL HIGH (ref 70–99)
Glucose-Capillary: 141 mg/dL — ABNORMAL HIGH (ref 70–99)
Glucose-Capillary: 113 mg/dL — ABNORMAL HIGH (ref 70–99)
Glucose-Capillary: 125 mg/dL — ABNORMAL HIGH (ref 70–99)

## 2018-06-13 MED ORDER — METFORMIN HCL 500 MG PO TABS
250.0000 mg | ORAL_TABLET | Freq: Once | ORAL | Status: AC
  Administered 2018-06-13: 250 mg via ORAL
  Filled 2018-06-13: qty 1

## 2018-06-13 MED ORDER — METFORMIN HCL 500 MG PO TABS
500.0000 mg | ORAL_TABLET | Freq: Every day | ORAL | Status: DC
  Administered 2018-06-14 – 2018-06-19 (×6): 500 mg via ORAL
  Filled 2018-06-13 (×6): qty 1

## 2018-06-13 NOTE — Progress Notes (Signed)
PHYSICAL MEDICINE & REHABILITATION PROGRESS NOTE  Subjective/Complaints: Patient seen laying in bed this morning.  He states he slept well overnight.  He notes improvement in strength of right ankle.  ROS: Denies CP, shortness of breath, nausea, vomiting, diarrhea.  Objective: Vital Signs: Blood pressure 115/72, pulse 90, temperature 98.1 F (36.7 C), temperature source Oral, resp. rate 18, height 5\' 8"  (1.727 m), weight 81.2 kg, SpO2 97 %. No results found. No results for input(s): WBC, HGB, HCT, PLT in the last 72 hours. Recent Labs    06/11/18 0515 06/12/18 0026  NA 134*  --   K 4.2  --   CL 96*  --   CO2 26  --   GLUCOSE 186*  --   BUN 19  --   CREATININE 1.14 1.21  CALCIUM 10.3  --     Physical Exam: BP 115/72 (BP Location: Left Arm)   Pulse 90   Temp 98.1 F (36.7 C) (Oral)   Resp 18   Ht 5\' 8"  (1.727 m)   Wt 81.2 kg   SpO2 97%   BMI 27.23 kg/m  Constitutional: No distress . Vital signs reviewed. HENT: Normocephalic.  Atraumatic. Eyes: EOMI.  No discharge.   Cardiovascular: No JVD. Respiratory: Normal effort. GI: Non-distended. Musc: No edema or tenderness in extremities. Neurological:  Alert and oriented Makes eye contact with examiner.  Dysarthria, stable Facial droop, stable Follows commands Motor:  RUE: Shoulder abduction 3-/5, distally 3+-4-/5 with apraxia, unchanged Right lower extremity: Hip flexion, knee extension 4-/5, ankle dorsiflexion 2-/5 Skin: Warm and dry.  Intact. Psych: Normal mood.  Normal behavior.  Assessment/Plan: 1. Functional deficits secondary to bilateral brain infarcts which require 3+ hours per day of interdisciplinary therapy in a comprehensive inpatient rehab setting.  Physiatrist is providing close team supervision and 24 hour management of active medical problems listed below.  Physiatrist and rehab team continue to assess barriers to discharge/monitor patient progress toward functional and medical  goals  Care Tool:  Bathing    Body parts bathed by patient: Chest, Abdomen, Front perineal area, Right upper leg, Left upper leg, Left lower leg, Face, Right arm, Right lower leg   Body parts bathed by helper: Left arm, Buttocks     Bathing assist Assist Level: Moderate Assistance - Patient 50 - 74%     Upper Body Dressing/Undressing Upper body dressing Upper body dressing/undressing activity did not occur (including orthotics): N/A What is the patient wearing?: Pull over shirt    Upper body assist Assist Level: Minimal Assistance - Patient > 75%    Lower Body Dressing/Undressing Lower body dressing      What is the patient wearing?: Pants, Incontinence brief     Lower body assist Assist for lower body dressing: Moderate Assistance - Patient 50 - 74%     Toileting Toileting Toileting Activity did not occur Press photographer and hygiene only): Safety/medical concerns  Toileting assist Assist for toileting: Maximal Assistance - Patient 25 - 49%     Transfers Chair/bed transfer  Transfers assist     Chair/bed transfer assist level: Moderate Assistance - Patient 50 - 74% Chair/bed transfer assistive device: Armrests   Locomotion Ambulation   Ambulation assist      Assist level: Moderate Assistance - Patient 50 - 74% Assistive device: Walker-rolling(R handsplint and ACE wrap on RLE) Max distance: 30'   Walk 10 feet activity   Assist     Assist level: Moderate Assistance - Patient - 50 - 74% Assistive  device: Walker-rolling, Orthosis   Walk 50 feet activity   Assist Walk 50 feet with 2 turns activity did not occur: Safety/medical concerns         Walk 150 feet activity   Assist Walk 150 feet activity did not occur: Safety/medical concerns         Walk 10 feet on uneven surface  activity   Assist Walk 10 feet on uneven surfaces activity did not occur: Safety/medical concerns         Wheelchair     Assist   Type of  Wheelchair: Manual    Wheelchair assist level: Supervision/Verbal cueing Max wheelchair distance: 50'    Wh16eelchair 50 feet with 2 turns activity    Assist        Assist Level: Supervision/Verbal cueing   Wheelchair 150 feet activity     Assist Wheelchair 150 feet activity did not occur: Safety/medical concerns   Assist Level: Supervision/Verbal cueing         Medical Problem List and Plan: 1.Right side weakness facial droop with dysarthriasecondary to bilateral punctate infarcts with largest infarct in left leftBG/CR on 05/29/2018. Status post TPA. 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  Continue CIR 2. Antithrombotics: -DVT/anticoagulation:Subcutaneous Lovenox  Creatinine within normal is on 5/21 -antiplatelet therapy: Aspirin 81 mg daily, Plavix 75 mg daily x3 months then Plavix alone   CBC within normal limits on 5/15, labs ordered for Monday 3. Pain Management:Tylenol as needed 4. Mood:Provide emotional support -antipsychotic agents: N/A 5. Neuropsych: This patientiscapable of making decisions on hisown behalf. 6. Skin/Wound Care:Routine skin checks 7. Fluids/Electrolytes/Nutrition:Routine in and outs  8.Hypertension with questionable medical noncompliance. HCTZ 12.5 mg daily  Lopressor 25 mg twice daily, increased to 37.5 twice daily on 5/20  Norvasc 10 mg daily.   Labile, however no recordings for this a.m.  Monitor with increased mobility 9.Hyperlipidemia. Lipitor 10.History of alcohol use.  Counseling 11.New findings type 2 diabetes mellitus.Hemoglobin A1c 6.5. SSI. Diabetic teaching  Metformin 250 daily started on 5/19, increased to 500 on 5/22  Elevated on 5/21  Monitor with increased mobility 12.  Hyponatremia  Sodium 134 on 5/20  Continue to monitor  LOS: 8 days A FACE TO FACE EVALUATION WAS PERFORMED  Joe Wood Joe Wood 06/13/2018, 11:51 AM

## 2018-06-13 NOTE — Progress Notes (Signed)
Occupational Therapy Weekly Progress Note  Patient Details  Name: Joe Wood MRN: [id number] Date of Birth: June 22, 1950  Beginning of progress report period: Jun 06, 2018 End of progress report period: Jun 13, 2018  Patient has met 3 of 5 short term goals.  Pt is making steady progress towards goals, however is limited due to fatigue and cognition impacting participation.  Pt has demonstrated ability to complete squat pivot and stand pivot transfers at mod assist level and sit > stand at min-mod assist.  Pt continues to demonstrate impaired awareness of Rt side of body during functional tasks and standing balance.  Bathing and dressing mod assist overall due to decreased attention to and awareness of Rt side of body.  Patient continues to demonstrate the following deficits: decreased cardiorespiratoy endurance, abnormal tone, unbalanced muscle activation, motor apraxia and decreased coordination, decreased midline orientation and decreased attention to right, decreased awareness, decreased problem solving, decreased memory and delayed processing and decreased sitting balance, decreased standing balance, decreased postural control, hemiplegia and decreased balance strategies and therefore will continue to benefit from skilled OT intervention to enhance overall performance with BADL and Reduce care partner burden.  Patient progressing toward long term goals..  Continue plan of care.  OT Short Term Goals Week 1:  OT Short Term Goal 1 (Week 1): Pt will be able to sit safely on tub bench with S to be able to bathe LB with mod A. OT Short Term Goal 1 - Progress (Week 1): Progressing toward goal OT Short Term Goal 2 (Week 1): Pt will be able to transfer to toilet with mod A of 1.  OT Short Term Goal 2 - Progress (Week 1): Met OT Short Term Goal 3 (Week 1): Pt will demonstrate improved R side awareness to don shirt over R arm with min cues.  OT Short Term Goal 3 - Progress (Week 1): Progressing  toward goal OT Short Term Goal 4 (Week 1): Pt will demonstrate improved motor planning to don shirt with min A and min cues.  OT Short Term Goal 4 - Progress (Week 1): Met OT Short Term Goal 5 (Week 1): Pt will be able to sit to stand for clothing management with mod A.  OT Short Term Goal 5 - Progress (Week 1): Met Week 2:  OT Short Term Goal 1 (Week 2): Pt will be able to sit safely on tub bench with S while engaging in bathing OT Short Term Goal 2 (Week 2): Pt will demonstrate improved R side awareness to don shirt over R arm with min cues.  OT Short Term Goal 3 (Week 2): Pt will complete toilet transfers min assist consistently OT Short Term Goal 4 (Week 2): Pt will complete bathing with min assist    Carely Nappier, [facility] 06/13/2018, 3:29 PM

## 2018-06-13 NOTE — Progress Notes (Signed)
Occupational Therapy Note  Patient Details  Name: Joe Wood MRN: [id number] Date of Birth: [birth year]  Today's Date: 06/13/2018 OT Missed Time: 45 Minutes Missed Time Reason: Patient unwilling/refused to participate without medical reason;Patient fatigue  Pt missed 45 mins scheduled OT treatment session due to refusal secondary to fatigue. Pt in bed upon arrival and refusing OOB or even bed level exercises stating that he was "too tired".  Educated on purpose of therapy with pt requesting to rest.  Will continue to follow per POC.   Rosalio Loud 06/13/2018, 3:25 PM

## 2018-06-13 NOTE — Progress Notes (Signed)
Speech Language Pathology Weekly Progress and Session Note  Patient Details  Name: Joe Wood MRN: [id number] Date of Birth: 08-10-1950  Beginning of progress report period: Jun 06, 2018 End of progress report period: Jun 13, 2018  Today's Date: 06/13/2018 SLP Individual Time: 1015-1110 SLP Individual Time Calculation (min): 55 min  Short Term Goals: Week 1: SLP Short Term Goal 1 (Week 1): Patient will articulate at the phrase level with >95% intelligibility utilizing compensatory strategies (i.e. slow rate, overarticulation, breath support) given moderate cues in order to increase independent communication skills. SLP Short Term Goal 1 - Progress (Week 1): Met SLP Short Term Goal 2 (Week 1): Patient will recall 4/4 WRAP memory strategies and utilize at least x1 strategy during a functional structured or unstructured task with 50% accuracy in order to combat memory deficits.  SLP Short Term Goal 2 - Progress (Week 1): Not met SLP Short Term Goal 3 (Week 1): Patient will complete functional executive function task for home (i.e. financial/schedule/medication management) with 60% accuracy and moderate cues in order to increase functional independence in the home environment. SLP Short Term Goal 3 - Progress (Week 1): Not met SLP Short Term Goal 4 (Week 1): Patient will complete complex, functional problem solving tasks with 75% accuracy and moderate cues in order to increase safety in the home environment. SLP Short Term Goal 4 - Progress (Week 1): Not met    New Short Term Goals: Week 2: SLP Short Term Goal 1 (Week 2): Patient will articulate at the phrase level with >95% intelligibility utilizing compensatory strategies (i.e. slow rate, overarticulation, breath support) given moderate cues in order to increase independent communication skills. SLP Short Term Goal 2 (Week 2): Patient will recall 4/4 WRAP memory strategies and utilize at least x1 strategy during a functional structured or  unstructured task with 50% accuracy.  SLP Short Term Goal 3 (Week 2): Patient will complete mildly complex, functional problem solving tasks with 75% accuracy and moderate cues in order to increase safety in the home environment.  Weekly Progress Updates: Patient has made functional gains and has met 1 of 4 STGs this reporting period. Currently, patient is ~90-100% intelligible at the phrase and sentence level with overall Min A verbal cues. Patient demonstrates fluctuating cognitive functioning and requires Mod-Max A multimodal cues to complete functional and familiar tasks safely in regards to problem solving and recall with use of strategies. Patient education ongoing. Patient would benefit from continued skilled SLP intervention to maximize his cognitive functioning prior to discharge.        Intensity: Minumum of 1-2 x/day, 30 to 90 minutes Frequency: 3 to 5 out of 7 days Duration/Length of Stay: 06/27/18 Treatment/Interventions: Cognitive remediation/compensation;Cueing hierarchy;Functional tasks;Internal/external aids;Medication managment;Patient/family education;Speech/Language facilitation;Therapeutic Activities   Daily Session  Skilled Therapeutic Interventions: Skilled treatment session focused on cognitive and speech goals. SLP facilitated session by providing supervision verbal cues for sequencing a 4-step picture task and Mod-Max A verbal and visual cues for sequencing a 6-step picture task. Patient was ~100% intelligible at the sentence level while verbally descibing the actions within the steps with overall supervision level verbal cues. Patient left upright in bed with all needs within reach and alarm on. Continue with current plan of care.     Pain No/Denies Pain   Therapy/Group: Individual Therapy  Adithya Difrancesco 06/13/2018, 6:42 AM

## 2018-06-13 NOTE — Progress Notes (Signed)
Physical Therapy Session Note  Patient Details  Name: Joe Wood MRN: [id number] Date of Birth: 11/09/1950  Today's Date: 06/13/2018 PT Individual Time: 1130-1223 PT Individual Time Calculation (min): 53 min   Short Term Goals: Week 2:  PT Short Term Goal 1 (Week 2): pt to perform gait x 25' with min A in controlled environment PT Short Term Goal 2 (Week 2): pt consistently min A with functional transfers  Skilled Therapeutic Interventions/Progress Updates:    pt rec'd in bed, agreeable to therapy. Pt has shoes this session, donned with total A.  Pt performs squat pivot transfers with min/mod A during session.  Gait with RW 50' x 2 with mod A for Rt LE placement, advancement, balance and RW control. Pt with improved knee stability with use of shoes and tactile cues. nustep x 5 minutes for UE/LE strength and coordination with frequent rest breaks due to fatigue. Pt performs seated AAROM for hip and knee control with continued significant weakness, improves with tactile cues. Pt performs w/c 2 x 150' with supervision and hemi technique. Pt left in bed with needs at hand, alarm set.  Therapy Documentation Precautions:  Precautions Precautions: Fall Precaution Comments: R lean and decreased R side awareness Restrictions Weight Bearing Restrictions: No Pain: Pain Assessment Pain Scale: 0-10 Pain Score: 0-No pain    Therapy/Group: Individual Therapy  Yaffa Seckman 06/13/2018, 12:51 PM

## 2018-06-13 NOTE — Progress Notes (Signed)
Physical Therapy Weekly Progress Note  Patient Details  Name: Joe Wood MRN: [id number] Date of Birth: 1950-03-12  Beginning of progress report period: Jun 06, 2018 End of progress report period: Jun 13, 2018   Patient has met 3 of 4 short term goals.  Pt progressing towards goals. Currently mod A with gait and transfers, continues to require assist for balance, awareness and Rt LE strength.  Patient continues to demonstrate the following deficits muscle weakness, impaired timing and sequencing, abnormal tone, unbalanced muscle activation, decreased coordination and decreased motor planning and decreased standing balance, decreased postural control, hemiplegia and decreased balance strategies and therefore will continue to benefit from skilled PT intervention to increase functional independence with mobility.  Patient progressing toward long term goals..  Continue plan of care.  PT Short Term Goals Week 1:  PT Short Term Goal 1 (Week 1): Patient to perform functional transfers consistently at mod A. PT Short Term Goal 1 - Progress (Week 1): Met PT Short Term Goal 2 (Week 1): Patient to demonstrate static standing balance with UE support and min A.  PT Short Term Goal 2 - Progress (Week 1): Progressing toward goal PT Short Term Goal 3 (Week 1): Patient to demonstrate static sitting balance with S.  PT Short Term Goal 3 - Progress (Week 1): Met PT Short Term Goal 4 (Week 1): Patient to ambulate 42' with LRAD and mod A. PT Short Term Goal 4 - Progress (Week 1): Met Week 2:  PT Short Term Goal 1 (Week 2): pt to perform gait x 25' with min A in controlled environment PT Short Term Goal 2 (Week 2): pt consistently min A with functional transfers  Skilled Therapeutic Interventions/Progress Updates:  Ambulation/gait training;DME/adaptive equipment instruction;Neuromuscular re-education;Stair training;UE/LE Strength taining/ROM;Wheelchair propulsion/positioning;Balance/vestibular  training;Cognitive remediation/compensation;Functional mobility training;Patient/family education;Therapeutic Exercise;UE/LE Coordination activities;Therapeutic Activities;Discharge planning;Functional electrical stimulation;Pain management;Splinting/orthotics;Community reintegration     [place] 06/13/2018, 8:20 AM

## 2018-06-14 ENCOUNTER — Inpatient Hospital Stay (HOSPITAL_COMMUNITY): Payer: Medicare Other | Admitting: Speech Pathology

## 2018-06-14 LAB — GLUCOSE, CAPILLARY
Glucose-Capillary: 147 mg/dL — ABNORMAL HIGH (ref 70–99)
Glucose-Capillary: 122 mg/dL — ABNORMAL HIGH (ref 70–99)
Glucose-Capillary: 187 mg/dL — ABNORMAL HIGH (ref 70–99)
Glucose-Capillary: 160 mg/dL — ABNORMAL HIGH (ref 70–99)

## 2018-06-14 NOTE — Progress Notes (Addendum)
Joe Wood PHYSICAL MEDICINE & REHABILITATION PROGRESS NOTE  Subjective/Complaints: Patient slept well last night, he feels like he is moving his arm and leg a little better  ROS: Denies CP, shortness of breath, nausea, vomiting, diarrhea.  Objective: Vital Signs: Blood pressure (!) 143/81, pulse 75, temperature 98.4 F (36.9 C), temperature source Oral, resp. rate 18, height 5\' 8"  (1.727 m), weight 81.2 kg, SpO2 95 %. No results found. No results for input(s): WBC, HGB, HCT, PLT in the last 72 hours. Recent Labs    06/12/18 0026  CREATININE 1.21    Physical Exam: BP (!) 143/81 (BP Location: Left Arm)   Pulse 75   Temp 98.4 F (36.9 C) (Oral)   Resp 18   Ht 5\' 8"  (1.727 m)   Wt 81.2 kg   SpO2 95%   BMI 27.23 kg/m  Constitutional: No distress . Vital signs reviewed. HENT: Normocephalic.  Atraumatic. Eyes: EOMI.  No discharge.   Cardiovascular: No JVD. Respiratory: Normal effort. GI: Non-distended. Musc: No edema or tenderness in extremities. Neurological:  Alert and oriented Makes eye contact with examiner.  Dysarthria, stable Facial droop, stable Follows commands Motor:  RUE: Shoulder abduction 3-/5, distally 3+-4-/5 with apraxia, unchanged Right lower extremity: Hip flexion, knee extension 4-/5, ankle dorsiflexion 2-/5 Skin: Warm and dry.  Intact. Psych: Normal mood.  Normal behavior.  Assessment/Plan: 1. Functional deficits secondary to bilateral brain infarcts which require 3+ hours per day of interdisciplinary therapy in a comprehensive inpatient rehab setting.  Physiatrist is providing close team supervision and 24 hour management of active medical problems listed below.  Physiatrist and rehab team continue to assess barriers to discharge/monitor patient progress toward functional and medical goals  Care Tool:  Bathing    Body parts bathed by patient: Chest, Abdomen, Front perineal area, Right upper leg, Left upper leg, Left lower leg, Face, Right  arm, Right lower leg   Body parts bathed by helper: Left arm, Buttocks     Bathing assist Assist Level: Moderate Assistance - Patient 50 - 74%     Upper Body Dressing/Undressing Upper body dressing Upper body dressing/undressing activity did not occur (including orthotics): N/A What is the patient wearing?: Pull over shirt    Upper body assist Assist Level: Minimal Assistance - Patient > 75%    Lower Body Dressing/Undressing Lower body dressing      What is the patient wearing?: Pants, Incontinence brief     Lower body assist Assist for lower body dressing: Moderate Assistance - Patient 50 - 74%     Toileting Toileting Toileting Activity did not occur Press photographer and hygiene only): Safety/medical concerns  Toileting assist Assist for toileting: Maximal Assistance - Patient 25 - 49%     Transfers Chair/bed transfer  Transfers assist     Chair/bed transfer assist level: Moderate Assistance - Patient 50 - 74% Chair/bed transfer assistive device: Armrests   Locomotion Ambulation   Ambulation assist      Assist level: Moderate Assistance - Patient 50 - 74% Assistive device: Walker-rolling(R handsplint and ACE wrap on RLE) Max distance: 30'   Walk 10 feet activity   Assist     Assist level: Moderate Assistance - Patient - 50 - 74% Assistive device: Walker-rolling, Orthosis   Walk 50 feet activity   Assist Walk 50 feet with 2 turns activity did not occur: Safety/medical concerns         Walk 150 feet activity   Assist Walk 150 feet activity did not occur: Safety/medical concerns  Walk 10 feet on uneven surface  activity   Assist Walk 10 feet on uneven surfaces activity did not occur: Safety/medical concerns         Wheelchair     Assist   Type of Wheelchair: Manual    Wheelchair assist level: Supervision/Verbal cueing Max wheelchair distance: 3050'    Wheelchair 50 feet with 2 turns activity    Assist         Assist Level: Supervision/Verbal cueing   Wheelchair 150 feet activity     Assist Wheelchair 150 feet activity did not occur: Safety/medical concerns   Assist Level: Supervision/Verbal cueing         Medical Problem List and Plan: 1.Right side weakness facial droop with dysarthriasecondary to bilateral punctate infarcts with largest infarct in left leftBG/CR on 05/29/2018. Status post TPA. 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  Continue CIR 2. Antithrombotics: -DVT/anticoagulation:Subcutaneous Lovenox  Creatinine within normal is on 5/21 -antiplatelet therapy: Aspirin 81 mg daily, Plavix 75 mg daily x3 months then Plavix alone   CBC within normal limits on 5/15, labs ordered for Monday 3. Pain Management:Tylenol as needed 4. Mood:Provide emotional support -antipsychotic agents: N/A 5. Neuropsych: This patientiscapable of making decisions on hisown behalf. 6. Skin/Wound Care:Routine skin checks 7. Fluids/Electrolytes/Nutrition:Routine in and outs  8.Hypertension with questionable medical noncompliance. HCTZ 12.5 mg daily  Lopressor 25 mg twice daily, increased to 37.5 twice daily on 5/20  Norvasc 10 mg daily.  Vitals:   06/13/18 2006 06/14/18 0424  BP: (!) 163/90 (!) 143/81  Pulse: 87 75  Resp: 19 18  Temp: 98.7 F (37.1 C) 98.4 F (36.9 C)  SpO2: 98% 95%    Monitor with increased mobility 9.Hyperlipidemia. Lipitor 10.History of alcohol use.  Counseling 11.New findings type 2 diabetes mellitus.Hemoglobin A1c 6.5. SSI. Diabetic teaching  Metformin 250 daily started on 5/19, increased to 500 on 5/22 CBG (last 3)  Recent Labs    06/13/18 1629 06/13/18 2106 06/14/18 0611  GLUCAP 113* 125* 147*  Good control on current medications 12.  Hyponatremia  Sodium 134 on 5/20  Continue to monitor  LOS: 9 days A FACE TO FACE EVALUATION WAS PERFORMED  Joe Wood  [place] 06/14/2018, 7:42 AM

## 2018-06-14 NOTE — Progress Notes (Signed)
Speech Language Pathology Daily Session Note  Patient Details  Name: Joe Wood MRN: [id number] Date of Birth: 30-Aug-1950  Today's Date: 06/14/2018 SLP Individual Time: 1015-1100 SLP Individual Time Calculation (min): 45 min  Short Term Goals: Week 2: SLP Short Term Goal 1 (Week 2): Patient will articulate at the phrase level with >95% intelligibility utilizing compensatory strategies (i.e. slow rate, overarticulation, breath support) given moderate cues in order to increase independent communication skills. SLP Short Term Goal 2 (Week 2): Patient will recall 4/4 WRAP memory strategies and utilize at least x1 strategy during a functional structured or unstructured task with 50% accuracy.  SLP Short Term Goal 3 (Week 2): Patient will complete mildly complex, functional problem solving tasks with 75% accuracy and moderate cues in order to increase safety in the home environment.  Skilled Therapeutic Interventions: Patient received skilled SLP services targeting speech intelligibility and cognitive goals. Patient required mod verbal cues to increase loudness level and over-articulate to bring listener understanding to 100% at the phrase level. Patient was unable to recall any WRAP memory strategies despite max cues. Education was provided on WRAP strategies. Patient participated in a problem solving task responding to questions about medication labels with 70% accuracy following mod verbal cues. Patient demonstrates increased insight into deficits reporting he knows he will need help with medication management upon discharge home. Upon completion of therapy session patient upright in bed, call bell within reach, and bed alarm activated.  Pain Pain Assessment Pain Scale: 0-10 Pain Score: 0-No pain  Therapy/Group: Individual Therapy  Arnette Schaumann 06/14/2018, 12:05 PM

## 2018-06-15 LAB — GLUCOSE, CAPILLARY
Glucose-Capillary: 180 mg/dL — ABNORMAL HIGH (ref 70–99)
Glucose-Capillary: 128 mg/dL — ABNORMAL HIGH (ref 70–99)
Glucose-Capillary: 129 mg/dL — ABNORMAL HIGH (ref 70–99)
Glucose-Capillary: 135 mg/dL — ABNORMAL HIGH (ref 70–99)

## 2018-06-15 NOTE — Progress Notes (Signed)
[place] PHYSICAL MEDICINE & REHABILITATION PROGRESS NOTE  Subjective/Complaints: No new issues overnight, slept well  ROS: Denies CP, shortness of breath, nausea, vomiting, diarrhea.  Objective: Vital Signs: Blood pressure (!) 153/72, pulse 83, temperature 98.1 F (36.7 C), temperature source Oral, resp. rate 18, height 5\' 8"  (1.727 m), weight 81.2 kg, SpO2 94 %. No results found. No results for input(s): WBC, HGB, HCT, PLT in the last 72 hours. No results for input(s): NA, K, CL, CO2, GLUCOSE, BUN, CREATININE, CALCIUM in the last 72 hours.  Physical Exam: BP (!) 153/72 (BP Location: Right Arm)   Pulse 83   Temp 98.1 F (36.7 C) (Oral)   Resp 18   Ht 5\' 8"  (1.727 m)   Wt 81.2 kg   SpO2 94%   BMI 27.23 kg/m  Constitutional: No distress . Vital signs reviewed. HENT: Normocephalic.  Atraumatic. Eyes: EOMI.  No discharge.   Cardiovascular: No JVD. Respiratory: Normal effort. GI: Non-distended. Musc: No edema or tenderness in extremities. Neurological:  Alert and oriented Makes eye contact with examiner.  Dysarthria, stable Facial droop, stable Follows commands Motor:  RUE: Shoulder abduction 3-/5, distally 3+-4-/5 with apraxia, unchanged Right lower extremity: Hip flexion, knee extension 4-/5, ankle dorsiflexion 2-/5 Skin: Warm and dry.  Intact. Psych: Normal mood.  Normal behavior.  Assessment/Plan: 1. Functional deficits secondary to bilateral brain infarcts which require 3+ hours per day of interdisciplinary therapy in a comprehensive inpatient rehab setting.  Physiatrist is providing close team supervision and 24 hour management of active medical problems listed below.  Physiatrist and rehab team continue to assess barriers to discharge/monitor patient progress toward functional and medical goals  Care Tool:  Bathing    Body parts bathed by patient: Chest, Abdomen, Front perineal area, Right upper leg, Left upper leg, Left lower leg, Face, Right arm, Right  lower leg   Body parts bathed by helper: Left arm, Buttocks     Bathing assist Assist Level: Moderate Assistance - Patient 50 - 74%     Upper Body Dressing/Undressing Upper body dressing Upper body dressing/undressing activity did not occur (including orthotics): N/A What is the patient wearing?: Pull over shirt    Upper body assist Assist Level: Minimal Assistance - Patient > 75%    Lower Body Dressing/Undressing Lower body dressing      What is the patient wearing?: Pants, Incontinence brief     Lower body assist Assist for lower body dressing: Moderate Assistance - Patient 50 - 74%     Toileting Toileting Toileting Activity did not occur Press photographer and hygiene only): Safety/medical concerns  Toileting assist Assist for toileting: Maximal Assistance - Patient 25 - 49%     Transfers Chair/bed transfer  Transfers assist     Chair/bed transfer assist level: Moderate Assistance - Patient 50 - 74% Chair/bed transfer assistive device: Armrests   Locomotion Ambulation   Ambulation assist      Assist level: Moderate Assistance - Patient 50 - 74% Assistive device: Walker-rolling(R handsplint and ACE wrap on RLE) Max distance: 30'   Walk 10 feet activity   Assist     Assist level: Moderate Assistance - Patient - 50 - 74% Assistive device: Walker-rolling, Orthosis   Walk 50 feet activity   Assist Walk 50 feet with 2 turns activity did not occur: Safety/medical concerns         Walk 150 feet activity   Assist Walk 150 feet activity did not occur: Safety/medical concerns  Walk 10 feet on uneven surface  activity   Assist Walk 10 feet on uneven surfaces activity did not occur: Safety/medical concerns         Wheelchair     Assist   Type of Wheelchair: Manual    Wheelchair assist level: Supervision/Verbal cueing Max wheelchair distance: 1650'    Wheelchair 50 feet with 2 turns activity    Assist         Assist Level: Supervision/Verbal cueing   Wheelchair 150 feet activity     Assist Wheelchair 150 feet activity did not occur: Safety/medical concerns   Assist Level: Supervision/Verbal cueing         Medical Problem List and Plan: 1.Right side weakness facial droop with dysarthriasecondary to bilateral punctate infarcts with largest infarct in left leftBG/CR on 05/29/2018. Status post TPA. 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  Continue CIR, PT OT speech 2. Antithrombotics: -DVT/anticoagulation:Subcutaneous Lovenox  Creatinine within normal is on 5/21 -antiplatelet therapy: Aspirin 81 mg daily, Plavix 75 mg daily x3 months then Plavix alone   CBC within normal limits on 5/15, labs ordered for Monday 3. Pain Management:Tylenol as needed 4. Mood:Provide emotional support -antipsychotic agents: N/A 5. Neuropsych: This patientiscapable of making decisions on hisown behalf. 6. Skin/Wound Care:Routine skin checks 7. Fluids/Electrolytes/Nutrition:Routine in and outs  8.Hypertension with questionable medical noncompliance. HCTZ 12.5 mg daily  Lopressor 25 mg twice daily, increased to 37.5 twice daily on 5/20  Norvasc 10 mg daily.  Vitals:   06/14/18 1935 06/15/18 0309  BP: (!) 148/84 (!) 153/72  Pulse: 94 83  Resp: 19 18  Temp: 98.5 F (36.9 C) 98.1 F (36.7 C)  SpO2: 98% 94%    Monitor with increased mobility 9.Hyperlipidemia. Lipitor 10.History of alcohol use.  Counseling 11.New findings type 2 diabetes mellitus.Hemoglobin A1c 6.5. SSI. Diabetic teaching  Metformin 250 daily started on 5/19, increased to 500 on 5/22 CBG (last 3)  Recent Labs    06/14/18 1642 06/14/18 2114 06/15/18 0609  GLUCAP 187* 160* 180*  fair control on current medications, 5/24 12.  Hyponatremia  Sodium 134 on 5/20  Continue to monitor  LOS: 10 days A FACE TO FACE EVALUATION WAS  PERFORMED  Erick Colacendrew E Kirsteins 06/15/2018, 7:38 AM

## 2018-06-16 ENCOUNTER — Inpatient Hospital Stay (HOSPITAL_COMMUNITY): Payer: Medicare Other | Admitting: Speech Pathology

## 2018-06-16 ENCOUNTER — Inpatient Hospital Stay (HOSPITAL_COMMUNITY): Payer: Medicare Other | Admitting: Physical Therapy

## 2018-06-16 ENCOUNTER — Inpatient Hospital Stay (HOSPITAL_COMMUNITY): Payer: Medicare Other | Admitting: Occupational Therapy

## 2018-06-16 LAB — GLUCOSE, CAPILLARY
Glucose-Capillary: 143 mg/dL — ABNORMAL HIGH (ref 70–99)
Glucose-Capillary: 110 mg/dL — ABNORMAL HIGH (ref 70–99)
Glucose-Capillary: 134 mg/dL — ABNORMAL HIGH (ref 70–99)
Glucose-Capillary: 188 mg/dL — ABNORMAL HIGH (ref 70–99)

## 2018-06-16 NOTE — Progress Notes (Signed)
Occupational Therapy Session Note  Patient Details  Name: Joe Wood MRN: [id number] Date of Birth: 1950/12/16  Today's Date: 06/16/2018 OT Individual Time: 0930-1030 OT Individual Time Calculation (min): 60 min    Short Term Goals: Week 2:  OT Short Term Goal 1 (Week 2): Pt will be able to sit safely on tub bench with S while engaging in bathing OT Short Term Goal 2 (Week 2): Pt will demonstrate improved R side awareness to don shirt over R arm with min cues.  OT Short Term Goal 3 (Week 2): Pt will complete toilet transfers min assist consistently OT Short Term Goal 4 (Week 2): Pt will complete bathing with min assist  Skilled Therapeutic Interventions/Progress Updates:    Treatment session with focus on ADL retraining, functional transfers, and standing balance.  Pt received supine in bed agreeable to shower.  Completed bed mobility with min assist and stand pivot transfer with mod assist to w/c.  Stand pivot transfer w/c > 3 in1 in shower with mod assist.  Engaged in bathing with focus on increased functional use of RUE as ROM during bathing.  Therapist assisted with lifting RUE to allow pt to wash under arm and then provided hand over hand assist when washing Lt arm and underarm.  Pt demonstrating increased horizontal adduction and internal rotation in RUE.  Min assist sit > stand when provided with UE support, mod assist without.  Pt able to recall hemi-technique for dressing, however continued to require max assist when donning pants.  Pt able to utilize RUE as gross assist to diminished level when completing grooming task.  Transferred min assist squat pivot w/c > recliner with improved anterior weight shift.  Pt left in recliner with legs elevated, seat belt alarm on, and all needs in reach.  Therapy Documentation Precautions:  Precautions Precautions: Fall Precaution Comments: R lean and decreased R side awareness Restrictions Weight Bearing Restrictions: No General:   Vital  Signs: Therapy Vitals Pulse Rate: 72 BP: 129/74 Patient Position (if appropriate): Lying Oxygen Therapy SpO2: 98 % O2 Device: Room Air Pain: Pain Assessment Pain Scale: 0-10 Pain Score: 0-No pain   Therapy/Group: Individual Therapy  Rosalio Loud 06/16/2018, 12:27 PM

## 2018-06-16 NOTE — Progress Notes (Signed)
[place] PHYSICAL MEDICINE & REHABILITATION PROGRESS NOTE  Subjective/Complaints: Patient seen laying in bed this AM.  He states he slept well overnight.  He states he had a good weekend.    ROS: Denies CP, shortness of breath, nausea, vomiting, diarrhea.  Objective: Vital Signs: Blood pressure 129/74, pulse 72, temperature 98.3 F (36.8 C), temperature source Oral, resp. rate 16, height 5\' 8"  (1.727 m), weight 81.2 kg, SpO2 98 %. No results found. No results for input(s): WBC, HGB, HCT, PLT in the last 72 hours. No results for input(s): NA, K, CL, CO2, GLUCOSE, BUN, CREATININE, CALCIUM in the last 72 hours.  Physical Exam: BP 129/74 (BP Location: Left Arm)   Pulse 72   Temp 98.3 F (36.8 C) (Oral)   Resp 16   Ht 5\' 8"  (1.727 m)   Wt 81.2 kg   SpO2 98%   BMI 27.23 kg/m  Constitutional: No distress . Vital signs reviewed. HENT: Normocephalic. Atraumatic.  Eyes: EOMI. No discharge.   Cardiovascular: No JVD. Respiratory: Normal effort. GI: Non-distended. Musc: No edema or tenderness in extremities. Neurological: Alert and oriented Makes eye contact with examiner.  Dysarthria, unchanged Facial droop, unchanged Follows commands Motor:  RUE: Shoulder abduction 2+/5, distally 3+-4-/5 with apraxia Right lower extremity: Hip flexion, knee extension 4-/5, ankle dorsiflexion 2-/5, improving Skin: Warm and dry.  Intact. Psych: Normal mood.  Normal behavior.  Assessment/Plan: 1. Functional deficits secondary to bilateral brain infarcts which require 3+ hours per day of interdisciplinary therapy in a comprehensive inpatient rehab setting.  Physiatrist is providing close team supervision and 24 hour management of active medical problems listed below.  Physiatrist and rehab team continue to assess barriers to discharge/monitor patient progress toward functional and medical goals  Care Tool:  Bathing    Body parts bathed by patient: Chest, Abdomen, Front perineal area,  Right upper leg, Left upper leg, Left lower leg, Face, Right arm, Right lower leg   Body parts bathed by helper: Left arm, Buttocks     Bathing assist Assist Level: Moderate Assistance - Patient 50 - 74%     Upper Body Dressing/Undressing Upper body dressing Upper body dressing/undressing activity did not occur (including orthotics): N/A What is the patient wearing?: Pull over shirt    Upper body assist Assist Level: Minimal Assistance - Patient > 75%    Lower Body Dressing/Undressing Lower body dressing      What is the patient wearing?: Pants, Incontinence brief     Lower body assist Assist for lower body dressing: Moderate Assistance - Patient 50 - 74%     Toileting Toileting Toileting Activity did not occur Press photographer(Clothing management and hygiene only): Safety/medical concerns  Toileting assist Assist for toileting: Maximal Assistance - Patient 25 - 49%     Transfers Chair/bed transfer  Transfers assist     Chair/bed transfer assist level: Moderate Assistance - Patient 50 - 74% Chair/bed transfer assistive device: Armrests   Locomotion Ambulation   Ambulation assist      Assist level: Moderate Assistance - Patient 50 - 74% Assistive device: Walker-rolling(R handsplint and ACE wrap on RLE) Max distance: 30'   Walk 10 feet activity   Assist     Assist level: Moderate Assistance - Patient - 50 - 74% Assistive device: Walker-rolling, Orthosis   Walk 50 feet activity   Assist Walk 50 feet with 2 turns activity did not occur: Safety/medical concerns         Walk 150 feet activity   Assist Walk 150  feet activity did not occur: Safety/medical concerns         Walk 10 feet on uneven surface  activity   Assist Walk 10 feet on uneven surfaces activity did not occur: Safety/medical concerns         Wheelchair     Assist   Type of Wheelchair: Manual    Wheelchair assist level: Supervision/Verbal cueing Max wheelchair distance: 34'     Wheelchair 50 feet with 2 turns activity    Assist        Assist Level: Supervision/Verbal cueing   Wheelchair 150 feet activity     Assist Wheelchair 150 feet activity did not occur: Safety/medical concerns   Assist Level: Supervision/Verbal cueing         Medical Problem List and Plan: 1.Right side weakness facial droop with dysarthriasecondary to bilateral punctate infarcts with largest infarct in left leftBG/CR on 05/29/2018. Status post TPA. 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  Continue CIR  Weekend notes - stable 2. Antithrombotics: -DVT/anticoagulation:Subcutaneous Lovenox  Creatinine within normal is on 5/21 -antiplatelet therapy: Aspirin 81 mg daily, Plavix 75 mg daily x3 months then Plavix alone   CBC within normal limits on 5/15, labs ordered for Monday 3. Pain Management:Tylenol as needed 4. Mood:Provide emotional support -antipsychotic agents: N/A 5. Neuropsych: This patientiscapable of making decisions on hisown behalf. 6. Skin/Wound Care:Routine skin checks 7. Fluids/Electrolytes/Nutrition:Routine in and outs  8.Hypertension with questionable medical noncompliance. HCTZ 12.5 mg daily  Lopressor 25 mg twice daily, increased to 37.5 twice daily on 5/20  Norvasc 10 mg daily.  Vitals:   06/16/18 0559 06/16/18 0917  BP: (!) 167/90 129/74  Pulse: 74 72  Resp:    Temp: 98.3 F (36.8 C)   SpO2: 100% 98%   Labile on 5/25  Monitor with increased mobility 9.Hyperlipidemia. Lipitor 10.History of alcohol use.  Counseling 11.New findings type 2 diabetes mellitus.Hemoglobin A1c 6.5. SSI. Diabetic teaching  Metformin 250 daily started on 5/19, increased to 500 on 5/22 CBG (last 3)  Recent Labs    06/15/18 1625 06/15/18 2146 06/16/18 0655  GLUCAP 129* 135* 143*   Slightly elevated on 5/25 12.  Hyponatremia  Sodium 134 on 5/20  Continue to monitor  LOS: 11  days A FACE TO FACE EVALUATION WAS PERFORMED  Melvinia Ashby Karis Juba 06/16/2018, 12:14 PM

## 2018-06-16 NOTE — Progress Notes (Signed)
Speech Language Pathology Daily Session Note  Patient Details  Name: Joe Wood MRN: [id number] Date of Birth: 03-19-1950  Today's Date: 06/16/2018 SLP Individual Time: 0800-0840 SLP Individual Time Calculation (min): 40 min  Short Term Goals: Week 2: SLP Short Term Goal 1 (Week 2): Patient will articulate at the phrase level with >95% intelligibility utilizing compensatory strategies (i.e. slow rate, overarticulation, breath support) given moderate cues in order to increase independent communication skills. SLP Short Term Goal 2 (Week 2): Patient will recall 4/4 WRAP memory strategies and utilize at least x1 strategy during a functional structured or unstructured task with 50% accuracy.  SLP Short Term Goal 3 (Week 2): Patient will complete mildly complex, functional problem solving tasks with 75% accuracy and moderate cues in order to increase safety in the home environment.  Skilled Therapeutic Interventions: Skilled treatment session focused on cognitive goals. SLP facilitated session by providing total A for recall of memory compensatory strategies, therefore, a handout was made to maximize recall and carryover. Patient utilized the strategies to recall the name of this clinician with Mod A verbal cues but was able to recall name at end of session after a 20 minute delay.  SLP also facilitated session by providing extra time and Min A verbal cues for functional problem solving during basic and familiar tasks. Patient left upright in bed with alarm on and all needs within reach. Continue with current plan of care.       Pain Pain Assessment Pain Scale: 0-10 Pain Score: 0-No pain  Therapy/Group: Individual Therapy  Tilden Broz 06/16/2018, 11:59 AM

## 2018-06-16 NOTE — Progress Notes (Signed)
Patient s/p stroke is disoriented throughout shift with interval of irritability and anxiousness, episodes with urinating on floor x2. Total care and bed change, unable to follow through with conversation , cueing needed. Safety is a concern, bed alarm adjusted to middle level.refer to assessment data sheet Monitor closely.

## 2018-06-16 NOTE — Progress Notes (Signed)
Physical Therapy Session Note  Patient Details  Name: Joe Wood MRN: [id number] Date of Birth: July 21, 1950  Today's Date: 06/16/2018 PT Individual Time: 1340-1420 PT Individual Time Calculation (min): 40 min   Short Term Goals: Week 2:  PT Short Term Goal 1 (Week 2): pt to perform gait x 25' with min A in controlled environment PT Short Term Goal 2 (Week 2): pt consistently min A with functional transfers  Skilled Therapeutic Interventions/Progress Updates:    standing NMR at railing with focus on Rt stance control and knee control to prevent hyperextension. Pt first performs tapping forward and sideways with LT LE. Then with Rt LE on 4'' step pt performs step ups forward and sideways with mod manual facilitation to prevent Rt knee hyperextension and facilitation hip control. Pt improving Rt LE strength.  Pt performs gait 2 x 50' with mod A with RW, continues to require assist to advance Rt LE and for Rt knee control as well as mod/max A for balance when turning to sit. Pt performs squat pivot transfers with min/mod A throughout session. Pt left in bed with alarm set, needs at hand.  Therapy Documentation Precautions:  Precautions Precautions: Fall Precaution Comments: R lean and decreased R side awareness Restrictions Weight Bearing Restrictions: No Pain:  no c/o pain   Therapy/Group: Individual Therapy  Zunairah Devers 06/16/2018, 2:24 PM

## 2018-06-17 ENCOUNTER — Inpatient Hospital Stay (HOSPITAL_COMMUNITY): Payer: Medicare Other

## 2018-06-17 ENCOUNTER — Inpatient Hospital Stay (HOSPITAL_COMMUNITY): Payer: Medicare Other | Admitting: Speech Pathology

## 2018-06-17 ENCOUNTER — Inpatient Hospital Stay (HOSPITAL_COMMUNITY): Payer: Medicare Other | Admitting: Physical Therapy

## 2018-06-17 ENCOUNTER — Inpatient Hospital Stay (HOSPITAL_COMMUNITY): Payer: Medicare Other | Admitting: Occupational Therapy

## 2018-06-17 DIAGNOSIS — G479 Sleep disorder, unspecified: Secondary | ICD-10-CM

## 2018-06-17 LAB — GLUCOSE, CAPILLARY
Glucose-Capillary: 161 mg/dL — ABNORMAL HIGH (ref 70–99)
Glucose-Capillary: 141 mg/dL — ABNORMAL HIGH (ref 70–99)
Glucose-Capillary: 148 mg/dL — ABNORMAL HIGH (ref 70–99)
Glucose-Capillary: 167 mg/dL — ABNORMAL HIGH (ref 70–99)

## 2018-06-17 MED ORDER — NON FORMULARY: 1.5000 mg | Freq: Every day | Status: DC

## 2018-06-17 MED ORDER — MELATONIN 3 MG PO TABS
1.5000 mg | ORAL_TABLET | Freq: Every day | ORAL | Status: DC
  Administered 2018-06-17: 22:00:00 1.5 mg via ORAL
  Filled 2018-06-17: qty 0.5

## 2018-06-17 NOTE — Progress Notes (Signed)
Occupational Therapy Session Note  Patient Details  Name: Joe Wood MRN: [id number] Date of Birth: 26-Jul-1950  Today's Date: 06/17/2018 OT Individual Time: 0900-1000 OT Individual Time Calculation (min): 60 min    Short Term Goals: Week 2:  OT Short Term Goal 1 (Week 2): Pt will be able to sit safely on tub bench with S while engaging in bathing OT Short Term Goal 2 (Week 2): Pt will demonstrate improved R side awareness to don shirt over R arm with min cues.  OT Short Term Goal 3 (Week 2): Pt will complete toilet transfers min assist consistently OT Short Term Goal 4 (Week 2): Pt will complete bathing with min assist  Skilled Therapeutic Interventions/Progress Updates:    Treatment session with focus on LB dressing, functional transfers, dynamic standing balance, and RUE NMR. Pt received upright in recliner agreeable to therapy session.  Declining bathing but wanting to don pants this session.  Min cues for hemi-dressing technique and assist when threading RLE, pt able to thread LLE and pull pants over hips while therapist assisted with maintaining standing balance.  Pt even able to fasten pants this session.  Engaged in blocked practice of stand vs squat pivot transfers w/c <> toilet with pt able to complete at mod assist level with improvements in weight shift when utilizing grab bar on Rt of toilet when transferring to Rt.  Completed oral care in sitting with use of RUE as gross to diminished level with good use of fingers.  Engaged in RUE NMR in sitting with focus on shoulder flexion and elbow extension with towel glides and then progressing to use of dowel rod to facilitate symmetrical reaching.  Engaged in fine motor control with picking up small items with good fine motor control and incorporating reaching to challenge shoulder flexion, pt with difficulty picking arm up from table.  Returned to room and transferred back to recliner mod assist.  Pt left with seat belt alarm on and all  needs in reach.  Therapy Documentation Precautions:  Precautions Precautions: Fall Precaution Comments: R lean and decreased R side awareness Restrictions Weight Bearing Restrictions: No Pain:  Pt with no c/o pain   Therapy/Group: Individual Therapy  Rosalio Loud 06/17/2018, 11:21 AM

## 2018-06-17 NOTE — Progress Notes (Signed)
Staffing notified for Tele- sitter per order,patient assessed and bed alarm in place, Notified wife Joud Kinslow) with update and informed of Tele-Sitter,monitor

## 2018-06-17 NOTE — Progress Notes (Signed)
Patient found kneeling on floor pads next to bed.  He stated he was "trying to re-position myself".  No injuries noted.  Vital signs stable.  Frazier Butt, PA, aware.

## 2018-06-17 NOTE — Progress Notes (Signed)
Physical Therapy Session Note  Patient Details  Name: Joe Wood MRN: [id number] Date of Birth: 1950-10-03  Today's Date: 06/17/2018 PT Individual Time: 1200-1230 PT Individual Time Calculation (min): 30 min   Short Term Goals: Week 2:  PT Short Term Goal 1 (Week 2): pt to perform gait x 25' with min A in controlled environment PT Short Term Goal 2 (Week 2): pt consistently min A with functional transfers  Skilled Therapeutic Interventions/Progress Updates:    Therapy Documentation Precautions:  Precautions Precautions: Fall Precaution Comments: R lean and decreased R side awareness Restrictions Weight Bearing Restrictions: No  Treatment:  Pt in bed on arrival and agreed to PT today. Denies any pain.  Max assist with cues/HOB 30 degrees for supine to sit on edge of bed toward left side. Seated edge of bed with supervision to don shoes to bil LE's with max assist (pt able to push foot down into shoe to get heel to fully go on). Mod assist with cues to stand from low bed to RW with right hand orthotic. Mod assist with step by step cues on transfer technique with RW from bed to wheelchair. Pt transported to hallway by PTA then had pt self propel wheelchair for >/300 feet with both right and left turns included using left UE/LE. In room mod assist to stand from wheelchair and transfer to recliner with RW/right hand orthotic. Cues needed on sequencing with transfer and mod assist for controlled sitting to recliner. Mod assist to stand up to RW with right hand orthotic: static standing working on upright posture in midline, progressing to lateral weight shifting with staying tall, then working on left LE stance with right LE stepping forward/backwards. emphasis place on step placement with use of circle target and on increased hip/knee flexion with stepping. Mod assist for controlled sitting to recliner. Pt then supervision to scoot back in chair.   Pt left with feet up and belt alarm on.  All needs in reach. No complaints or issues reported by pt with session. continues to need cues to engage right side (secure hand on orthotic, shift onto right leg with transfers/standing).   Therapy/Group: Individual Therapy  Livingston Diones, PTA, CLT 06/17/18, 3:30 PM

## 2018-06-17 NOTE — Progress Notes (Signed)
Physical Therapy Session Note  Patient Details  Name: Joe Wood MRN: [id number] Date of Birth: 06/29/1950  Today's Date: 06/17/2018 PT Individual Time: 1515-1600 PT Individual Time Calculation (min): 45 min   Short Term Goals: Week 2:  PT Short Term Goal 1 (Week 2): pt to perform gait x 25' with min A in controlled environment PT Short Term Goal 2 (Week 2): pt consistently min A with functional transfers  Skilled Therapeutic Interventions/Progress Updates:    Patient in supine and agreeable to PT.  Supine to sit on R side of bed with min A for scooting EOB.  Patient sit to stand to RW with R hand splint mod A initially.  Ambulated 30' with RW and mod A for weight shift, R hip stability in stance for less knee recurvatum.  Sit to stand from chair mod cues for foot positioning and weight shift min A.  Ambulated 30' with RW mod A.  Sit <>stand practice with ball in front and both hands on ball with sit to squat after foot positioning. 2 x 5 reps.  Standing R stance stability with taps to 4" step L foot with walker for support.  At wall side step ups to 4" step with rail for support and mod A for safety.  Side taps with L to step stance on R.  Patient in w/c propelled with feet to room with S and cues 30'.  Stand pivot to bed mod A and sit to supine min a for positioning.  Left with call bell in reach and bed alarm activated.    Therapy Documentation Precautions:  Precautions Precautions: Fall Precaution Comments: R lean and decreased R side awareness Restrictions Weight Bearing Restrictions: No Pain: Pain Assessment Pain Score: 0-No pain    Therapy/Group: Individual Therapy  Elray Mcgregor  Sheran Lawless, PT 06/17/2018, 5:43 PM

## 2018-06-17 NOTE — Progress Notes (Signed)
Social Work Patient ID: Joe Wood, male   DOB: 13-Feb-1950, 68 y.o.   MRN: [id number] Spoke with wife via telephone to discuss family education and her questions. She wanted a POA and informed her we only do HCPOA. She will look into a POA once pt is home from the hospital. Scheduled for she and her son to come in for family training on Tuesday @ 10:00 and may need to come back for more. Wife does not seem to realize the amount of care pt will need at discharge which is good she is coming in early and bringing her son with her.

## 2018-06-17 NOTE — Progress Notes (Signed)
PHYSICAL MEDICINE & REHABILITATION PROGRESS NOTE  Subjective/Complaints: Patient seen laying in bed this morning.  He states he slept well overnight.  Per nursing, patient was restless overnight.  ROS: Denies CP, shortness of breath, nausea, vomiting, diarrhea.  Objective: Vital Signs: Blood pressure (!) 143/76, pulse 89, temperature 98.5 F (36.9 C), temperature source Oral, resp. rate 18, height 5\' 8"  (1.727 m), weight 81.2 kg, SpO2 98 %. No results found. No results for input(s): WBC, HGB, HCT, PLT in the last 72 hours. No results for input(s): NA, K, CL, CO2, GLUCOSE, BUN, CREATININE, CALCIUM in the last 72 hours.  Physical Exam: BP (!) 143/76 (BP Location: Right Arm)   Pulse 89   Temp 98.5 F (36.9 C) (Oral)   Resp 18   Ht 5\' 8"  (1.727 m)   Wt 81.2 kg   SpO2 98%   BMI 27.23 kg/m  Constitutional: No distress . Vital signs reviewed. HENT: Normocephalic.  Atraumatic. Eyes: EOMI.  No discharge.   Cardiovascular: No JVD. Respiratory: Normal effort. GI: Non-distended. Musc: No edema or tenderness in extremities. Neurological: Alert and oriented Makes eye contact with examiner.  Dysarthria, stable Facial droop, stable Follows commands Motor:  RUE: Shoulder abduction 2+/5, distally 3+-4-/5 with apraxia, stable Right lower extremity: Hip flexion, knee extension 4-/5, ankle dorsiflexion 2-/5, stable Skin: Warm and dry.  Intact. Psych: Normal mood.  Normal behavior.  Assessment/Plan: 1. Functional deficits secondary to bilateral brain infarcts which require 3+ hours per day of interdisciplinary therapy in a comprehensive inpatient rehab setting.  Physiatrist is providing close team supervision and 24 hour management of active medical problems listed below.  Physiatrist and rehab team continue to assess barriers to discharge/monitor patient progress toward functional and medical goals  Care Tool:  Bathing    Body parts bathed by patient: Chest, Abdomen,  Front perineal area, Right upper leg, Left upper leg, Left lower leg, Face, Right arm, Right lower leg   Body parts bathed by helper: Left arm, Buttocks     Bathing assist Assist Level: Moderate Assistance - Patient 50 - 74%     Upper Body Dressing/Undressing Upper body dressing Upper body dressing/undressing activity did not occur (including orthotics): N/A What is the patient wearing?: Pull over shirt    Upper body assist Assist Level: Minimal Assistance - Patient > 75%    Lower Body Dressing/Undressing Lower body dressing      What is the patient wearing?: Pants, Incontinence brief     Lower body assist Assist for lower body dressing: Moderate Assistance - Patient 50 - 74%     Toileting Toileting Toileting Activity did not occur Press photographer(Clothing management and hygiene only): Safety/medical concerns  Toileting assist Assist for toileting: Maximal Assistance - Patient 25 - 49%     Transfers Chair/bed transfer  Transfers assist     Chair/bed transfer assist level: Moderate Assistance - Patient 50 - 74% Chair/bed transfer assistive device: Armrests   Locomotion Ambulation   Ambulation assist      Assist level: Moderate Assistance - Patient 50 - 74% Assistive device: Walker-rolling(R handsplint and ACE wrap on RLE) Max distance: 30'   Walk 10 feet activity   Assist     Assist level: Moderate Assistance - Patient - 50 - 74% Assistive device: Walker-rolling, Orthosis   Walk 50 feet activity   Assist Walk 50 feet with 2 turns activity did not occur: Safety/medical concerns         Walk 150 feet activity   Assist Walk  150 feet activity did not occur: Safety/medical concerns         Walk 10 feet on uneven surface  activity   Assist Walk 10 feet on uneven surfaces activity did not occur: Safety/medical concerns         Wheelchair     Assist   Type of Wheelchair: Manual    Wheelchair assist level: Supervision/Verbal cueing Max  wheelchair distance: 45'    Wheelchair 50 feet with 2 turns activity    Assist        Assist Level: Supervision/Verbal cueing   Wheelchair 150 feet activity     Assist Wheelchair 150 feet activity did not occur: Safety/medical concerns   Assist Level: Supervision/Verbal cueing         Medical Problem List and Plan: 1.Right side weakness facial droop with dysarthriasecondary to bilateral punctate infarcts with largest infarct in left leftBG/CR on 05/29/2018. Status post TPA. 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  Continue CIR 2. Antithrombotics: -DVT/anticoagulation:Subcutaneous Lovenox  Creatinine within normal is on 5/21 -antiplatelet therapy: Aspirin 81 mg daily, Plavix 75 mg daily x3 months then Plavix alone   CBC within normal limits on 5/15, labs ordered for Monday 3. Pain Management:Tylenol as needed 4. Mood:Provide emotional support -antipsychotic agents: N/A 5. Neuropsych: This patientiscapable of making decisions on hisown behalf. 6. Skin/Wound Care:Routine skin checks 7. Fluids/Electrolytes/Nutrition:Routine in and outs  8.Hypertension with questionable medical noncompliance. HCTZ 12.5 mg daily  Lopressor 25 mg twice daily, increased to 37.5 twice daily on 5/20  Norvasc 10 mg daily.  Vitals:   06/16/18 1452 06/16/18 1937  BP: 127/82 (!) 143/76  Pulse: 73 89  Resp: 17 18  Temp: 99 F (37.2 C) 98.5 F (36.9 C)  SpO2: 98% 98%   Labile on 5/26  Monitor with increased mobility 9.Hyperlipidemia. Lipitor 10.History of alcohol use.  Counseling 11.New findings type 2 diabetes mellitus.Hemoglobin A1c 6.5. SSI. Diabetic teaching  Metformin 250 daily started on 5/19, increased to 500 on 5/22 CBG (last 3)  Recent Labs    06/16/18 1651 06/16/18 2117 06/17/18 0620  GLUCAP 134* 188* 161*   Labile on 5/26 12.  Hyponatremia  Sodium 134 on 5/20  Continue to  monitor 13.  Sleep disturbance  Melatonin started on 5/26  LOS: 12 days A FACE TO FACE EVALUATION WAS PERFORMED  Marcelyn Ruppe Karis Juba 06/17/2018, 9:30 AM

## 2018-06-18 ENCOUNTER — Inpatient Hospital Stay (HOSPITAL_COMMUNITY): Payer: Medicare Other | Admitting: Occupational Therapy

## 2018-06-18 ENCOUNTER — Inpatient Hospital Stay (HOSPITAL_COMMUNITY): Payer: Medicare Other | Admitting: Physical Therapy

## 2018-06-18 ENCOUNTER — Inpatient Hospital Stay (HOSPITAL_COMMUNITY): Payer: Medicare Other | Admitting: Speech Pathology

## 2018-06-18 DIAGNOSIS — K5901 Slow transit constipation: Secondary | ICD-10-CM

## 2018-06-18 LAB — GLUCOSE, CAPILLARY
Glucose-Capillary: 172 mg/dL — ABNORMAL HIGH (ref 70–99)
Glucose-Capillary: 127 mg/dL — ABNORMAL HIGH (ref 70–99)
Glucose-Capillary: 112 mg/dL — ABNORMAL HIGH (ref 70–99)
Glucose-Capillary: 132 mg/dL — ABNORMAL HIGH (ref 70–99)

## 2018-06-18 MED ORDER — POLYETHYLENE GLYCOL 3350 17 G PO PACK
17.0000 g | PACK | Freq: Every day | ORAL | Status: DC
  Administered 2018-06-18 – 2018-06-26 (×8): 17 g via ORAL
  Filled 2018-06-18 (×9): qty 1

## 2018-06-18 MED ORDER — MELATONIN 3 MG PO TABS
3.0000 mg | ORAL_TABLET | Freq: Every day | ORAL | Status: DC
  Administered 2018-06-18 – 2018-06-26 (×9): 3 mg via ORAL
  Filled 2018-06-18 (×9): qty 1

## 2018-06-18 NOTE — Progress Notes (Signed)
Orthopedic Tech Progress Note Patient Details:  Joe Wood [birth year] [id number]  Patient ID: Joe Wood, male   DOB: 05/19/1950, 68 y.o.   MRN: [id number]   Joe Wood 06/18/2018, 2:29 PMCalled [employer] for right AFO brace.

## 2018-06-18 NOTE — Progress Notes (Signed)
Speech Language Pathology Daily Session Note  Patient Details  Name: Joe Wood MRN: [id number] Date of Birth: June 24, 1950  Today's Date: 06/18/2018 SLP Individual Time: 4259-5638 SLP Individual Time Calculation (min): 40 min  Short Term Goals: Week 2: SLP Short Term Goal 1 (Week 2): Patient will articulate at the phrase level with >95% intelligibility utilizing compensatory strategies (i.e. slow rate, overarticulation, breath support) given moderate cues in order to increase independent communication skills. SLP Short Term Goal 2 (Week 2): Patient will recall 4/4 WRAP memory strategies and utilize at least x1 strategy during a functional structured or unstructured task with 50% accuracy.  SLP Short Term Goal 3 (Week 2): Patient will complete mildly complex, functional problem solving tasks with 75% accuracy and moderate cues in order to increase safety in the home environment.  Skilled Therapeutic Interventions: Skilled treatment session focused on cognitive goals. SLP facilitated session by providing Max A multimodal cues for utilization of memory compensatory strategies (WRAP) to maximize recall and carryover of information during a novel task. Patient also required Max A verbal cues for intellectual awareness of physical impairments and reported he fell out of bed because he was "trying to test his knees." Patient also reported, "I know if I fell, I would be able to get back up" (needed help from nursing staff yesterday). SLP provided education. Patient left upright in recliner with alarm on and all needs within reach. Continue with current plan of care.      Pain Pain Assessment Pain Scale: 0-10 Pain Score: 0-No pain  Therapy/Group: Individual Therapy  Anjeanette Petzold 06/18/2018, 12:58 PM

## 2018-06-18 NOTE — Progress Notes (Signed)
Speech Language Pathology Daily Session Note  Patient Details  Name: Joe Wood MRN: [id number] Date of Birth: 1951/01/07  Today's Date: 06/17/2018 SLP Individual Time: 0800-0900 SLP Individual Time Calculation: 60 minutes    Short Term Goals: Week 2: SLP Short Term Goal 1 (Week 2): Patient will articulate at the phrase level with >95% intelligibility utilizing compensatory strategies (i.e. slow rate, overarticulation, breath support) given moderate cues in order to increase independent communication skills. SLP Short Term Goal 2 (Week 2): Patient will recall 4/4 WRAP memory strategies and utilize at least x1 strategy during a functional structured or unstructured task with 50% accuracy.  SLP Short Term Goal 3 (Week 2): Patient will complete mildly complex, functional problem solving tasks with 75% accuracy and moderate cues in order to increase safety in the home environment.  Skilled Therapeutic Interventions:  Skilled treatment session focused on cognition goals. SLP facilitated session by providing question cues to recall 4 components of WRAP. Pt able to locate WRAP sign in room and read to SLP. With Mod A cues, pt able to demonstrate concepts to aid in memory. SLP further facilitated session by providing Mod A faded to Min A cues to play card game in its simplest form. Specifically, pt required cues to recall rules of game (despite having written visual cue on table), problem solving rules and self-awareness/insight into errors. Pt able to achieve ~ > 95% speech intelligibility when communicating desire for more blankets and specifics on how pt wanted to be left in recliner. Pt left recliner with all needs within reach. Continue per current plan of care.      Pain Pain Assessment Pain Score: 0-No pain  Therapy/Group: Individual Therapy  Katharyn Schauer 06/18/2018, 7:36 AM

## 2018-06-18 NOTE — Progress Notes (Signed)
Occupational Therapy Session Note  Patient Details  Name: Joe Wood MRN: [id number] Date of Birth: [birth year]  Today's Date: 06/18/2018 OT Individual Time: 5498-2641 OT Individual Time Calculation (min): 57 min    Short Term Goals: Week 2:  OT Short Term Goal 1 (Week 2): Pt will be able to sit safely on tub bench with S while engaging in bathing OT Short Term Goal 2 (Week 2): Pt will demonstrate improved R side awareness to don shirt over R arm with min cues.  OT Short Term Goal 3 (Week 2): Pt will complete toilet transfers min assist consistently OT Short Term Goal 4 (Week 2): Pt will complete bathing with min assist  Skilled Therapeutic Interventions/Progress Updates:    Treatment session with focus on ADL retraining with functional transfers, dynamic standing balance, and functional use of RUE during bathing and dressing.  Pt received supine in bed reporting not sleeping well over night.  Rolled over in to prone when rolling to Lt side of bed, when questioned why pt turned completely over instead of just rolling to Lt, pt stated "I don't know, I've been so confused lately".  Completed squat pivot transfer min assist bed > w/c with assist to place RUE on arm rest to assist with weight shift.  Pt reports need to toilet.  Mod assist transfer to toilet due to mild impulsivity due to urgency.  Pt required increased time due to difficulty having a BM.  Engaged in discussion regarding possibility of stool softener - pt able to recall later in session and request assist from MD.  Pt demonstrated increased sequencing with ability to bathe with only min assist for standing balance when washing buttocks and hand over hand assist to ensure thoroughness when washing LUE.  Pt able to thread BLE in pants with increased time and demonstrated increase orientation and sequencing with shirt.  Utilized step stool when doffing and donning hospital socks.  Completed stand pivot transfer to recliner with min  assist.  Pt left upright in recliner with seat belt alarm on and all needs in reach, breakfast tray set up.  Therapy Documentation Precautions:  Precautions Precautions: Fall Precaution Comments: R lean and decreased R side awareness Restrictions Weight Bearing Restrictions: No General:   Vital Signs: Therapy Vitals Temp: 98.4 F (36.9 C) Temp Source: Oral Pulse Rate: 68 Resp: 12 BP: 124/84 Patient Position (if appropriate): Lying Oxygen Therapy SpO2: 97 % Pain: Pain Assessment Pain Score: 0-No pain   Therapy/Group: Individual Therapy  Rosalio Loud 06/18/2018, 8:31 AM

## 2018-06-18 NOTE — Plan of Care (Signed)
  Problem: Consults Goal: RH STROKE PATIENT EDUCATION Description See Patient Education module for education specifics  Outcome: Progressing   Problem: RH BOWEL ELIMINATION Goal: RH STG MANAGE BOWEL WITH ASSISTANCE Description STG Manage Bowel with Mod I Assistance.  Outcome: Progressing Goal: RH STG MANAGE BOWEL W/MEDICATION W/ASSISTANCE Description STG Manage Bowel with Medication with Min Assistance.  Outcome: Progressing   Problem: RH BLADDER ELIMINATION Goal: RH STG MANAGE BLADDER WITH ASSISTANCE Description STG Manage Bladder With Mod I Assistance  Outcome: Progressing   Problem: RH SKIN INTEGRITY Goal: RH STG MAINTAIN SKIN INTEGRITY WITH ASSISTANCE Description STG Maintain Skin Integrity With Mod Assistance.  Outcome: Progressing   Problem: RH SAFETY Goal: RH STG ADHERE TO SAFETY PRECAUTIONS W/ASSISTANCE/DEVICE Description STG Adhere to Safety Precautions With Mod Assistance and appropriate assistive Device.  Outcome: Progressing   Problem: RH PAIN MANAGEMENT Goal: RH STG PAIN MANAGED AT OR BELOW PT'S PAIN GOAL Description <3 on a 0-10 pain scale  Outcome: Progressing   Problem: RH KNOWLEDGE DEFICIT Goal: RH STG INCREASE KNOWLEDGE OF DIABETES Description Patient will demonstrate knowledge on diabetes medications, dietary restrictions, and blood sugar parameters to rehab staff with min assist. Patient will follow up with MD after discharge.  Outcome: Progressing Goal: RH STG INCREASE KNOWLEDGE OF HYPERTENSION Description Patient will demonstrate knowledge of HTN medications, dietary restrictions, and BP parameters with min assist from rehab staff. Patient will follow up with MD after discharge.   Outcome: Progressing Goal: RH STG INCREASE KNOWLEDGE OF STROKE PROPHYLAXIS Description Patient will demonstrate knowledge of medications used to help prevent future strokes with min assist from rehab staff and follow up with MD after discharge.  Outcome:  Progressing

## 2018-06-18 NOTE — Progress Notes (Signed)
Physical Therapy Session Note  Patient Details  Name: Joe Wood MRN: [id number] Date of Birth: 1950-02-02  Today's Date: 06/18/2018 PT Individual Time: 1305-1330 and 1500-1555 PT Individual Time Calculation (min): 25 minand 55 min    Short Term Goals: Week 2:  PT Short Term Goal 1 (Week 2): pt to perform gait x 25' with min A in controlled environment PT Short Term Goal 2 (Week 2): pt consistently min A with functional transfers  Skilled Therapeutic Interventions/Progress Updates:  Session 1 Pt received sitting in recliner and agreeable to PT. Stand pivot transfer to [facility] with mod assist and RW. Moderate cues for AD management and gait pattern. Pt instructed in WC mobility x 258f with supervision assist and min cues for turning technique to the L. Gait training performed with RW, R hand orthotic, and mod assist from PT to improve step width on the R, prevent GR, and improve weight shift L to allow swing on the R. [facility] and all needs met.    Session 2.  Pt received sitting in WC and agreeable to PT. WC mobility through hall 2x 630fwith supervision assist from PT to prevent hitting doorway.   PT fit pt for RAFO, posterior support. Gait training with AFO and RW 2 x [id number] min-mod assist from PT for safety and weight shifting. Significant improvement in foot clearance, and knee stability, but intermittent Genu recurvatum still noted on the R.   Sit<>stand x 5 with min assist from PT and mod cues for posture and set up to improve symmetry of WB through BLE. Pt performed stand pivot transfer to nustep with min assist to the L and mod assist to the R with RW and AFO. nustep reciprocal movement training x 8 minutes, level 4>5 with min cues throughout for improved attention and control of the RLE. Pt able to maintain adequate grasp on the R UE throughout reciprocal movement training without additional assist from PT.   Pt  returned to room and performed stand pivot transfer to bed withno AD and Mod assist from PT. Sit>supine completed with min assist to control the RLE, and left supine in bed with call bell in reach and all needs met.       Therapy Documentation Precautions:  Precautions Precautions: Fall Precaution Comments: R lean and decreased R side awareness Restrictions Weight Bearing Restrictions: No Pain:   denies    Therapy/Group: Individual Therapy  AusLorie Phenix27/2020, 2:40 PM

## 2018-06-18 NOTE — Progress Notes (Signed)
[place] PHYSICAL MEDICINE & REHABILITATION PROGRESS NOTE  Subjective/Complaints: Patient seen sitting up in his chair working with therapy this morning.  He states he slept well overnight initially, then states he slept fairly overnight.  He indicated to therapies that he did not sleep well overnight, discussed with therapies.  He notes he had a hard bowel movement this morning.  He also had a fall overnight, discussed with therapies.  ROS: Denies CP, shortness of breath, nausea, vomiting, diarrhea.  Objective: Vital Signs: Blood pressure 124/84, pulse 68, temperature 98.4 F (36.9 C), temperature source Oral, resp. rate 12, height 5\' 8"  (1.727 m), weight 81.2 kg, SpO2 97 %. No results found. No results for input(s): WBC, HGB, HCT, PLT in the last 72 hours. No results for input(s): NA, K, CL, CO2, GLUCOSE, BUN, CREATININE, CALCIUM in the last 72 hours.  Physical Exam: BP 124/84 (BP Location: Right Arm)   Pulse 68   Temp 98.4 F (36.9 C) (Oral)   Resp 12   Ht 5\' 8"  (1.727 m)   Wt 81.2 kg   SpO2 97%   BMI 27.23 kg/m  Constitutional: No distress . Vital signs reviewed. HENT: Normocephalic.  Atraumatic. Eyes: EOMI.  No discharge.   Cardiovascular: No JVD. Respiratory: Normal effort. GI: Non-distended. Musc: No edema or tenderness in extremities. Neurological:  Alert and oriented Makes eye contact with examiner.  Dysarthria, stable Facial droop, stable Follows commands Motor:  RUE: Shoulder abduction 2+/5, distally 3+-4-/5 with apraxia, unchanged Right lower extremity: Hip flexion, knee extension 4-/5, ankle dorsiflexion 2-2+/5 Skin: Warm and dry.  Intact. Psych: Normal mood.  Normal behavior.  Assessment/Plan: 1. Functional deficits secondary to bilateral brain infarcts which require 3+ hours per day of interdisciplinary therapy in a comprehensive inpatient rehab setting.  Physiatrist is providing close team supervision and 24 hour management of active medical  problems listed below.  Physiatrist and rehab team continue to assess barriers to discharge/monitor patient progress toward functional and medical goals  Care Tool:  Bathing    Body parts bathed by patient: Chest, Abdomen, Front perineal area, Right upper leg, Left upper leg, Left lower leg, Face, Right arm, Right lower leg, Buttocks   Body parts bathed by helper: Left arm     Bathing assist Assist Level: Minimal Assistance - Patient > 75%     Upper Body Dressing/Undressing Upper body dressing Upper body dressing/undressing activity did not occur (including orthotics): N/A What is the patient wearing?: Pull over shirt    Upper body assist Assist Level: Contact Guard/Touching assist    Lower Body Dressing/Undressing Lower body dressing      What is the patient wearing?: Pants     Lower body assist Assist for lower body dressing: Minimal Assistance - Patient > 75%     Toileting Toileting Toileting Activity did not occur (Clothing management and hygiene only): Safety/medical concerns  Toileting assist Assist for toileting: Moderate Assistance - Patient 50 - 74%     Transfers Chair/bed transfer  Transfers assist     Chair/bed transfer assist level: Minimal Assistance - Patient > 75% Chair/bed transfer assistive device: Armrests   Locomotion Ambulation   Ambulation assist      Assist level: Moderate Assistance - Patient 50 - 74% Assistive device: Walker-rolling Max distance: 30'   Walk 10 feet activity   Assist     Assist level: Moderate Assistance - Patient - 50 - 74% Assistive device: Walker-rolling   Walk 50 feet activity   Assist Walk 50 feet with  2 turns activity did not occur: Safety/medical concerns         Walk 150 feet activity   Assist Walk 150 feet activity did not occur: Safety/medical concerns         Walk 10 feet on uneven surface  activity   Assist Walk 10 feet on uneven surfaces activity did not occur: Safety/medical  concerns         Wheelchair     Assist   Type of Wheelchair: Manual    Wheelchair assist level: Supervision/Verbal cueing Max wheelchair distance: 30'    Wheelchair 50 feet with 2 turns activity    Assist        Assist Level: Supervision/Verbal cueing   Wheelchair 150 feet activity     Assist Wheelchair 150 feet activity did not occur: Safety/medical concerns   Assist Level: Supervision/Verbal cueing         Medical Problem List and Plan: 1.Right side weakness facial droop with dysarthriasecondary to bilateral punctate infarcts with largest infarct in left leftBG/CR on 05/29/2018. Status post TPA. 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  Continue CIR  Team conference today to discuss current and goals and coordination of care, home and environmental barriers, and discharge planning with nursing, case manager, and therapies.  2. Antithrombotics: -DVT/anticoagulation:Subcutaneous Lovenox  Creatinine within normal is on 5/21, labs ordered for tomorrow -antiplatelet therapy: Aspirin 81 mg daily, Plavix 75 mg daily x3 months then Plavix alone   CBC within normal limits on 5/15, labs ordered for Monday 3. Pain Management:Tylenol as needed 4. Mood:Provide emotional support -antipsychotic agents: N/A 5. Neuropsych: This patientiscapable of making decisions on hisown behalf.  Tele-sitter ordered for safety 6. Skin/Wound Care:Routine skin checks 7. Fluids/Electrolytes/Nutrition:Routine in and outs  8.Hypertension with questionable medical noncompliance. HCTZ 12.5 mg daily  Lopressor 25 mg twice daily, increased to 37.5 twice daily on 5/20  Norvasc 10 mg daily.  Vitals:   06/17/18 2125 06/18/18 0453  BP: 137/73 124/84  Pulse: 93 68  Resp: 12 12  Temp: 98.4 F (36.9 C) 98.4 F (36.9 C)  SpO2: 98% 97%   Controlled on 5/27  Monitor with increased mobility 9.Hyperlipidemia.  Lipitor 10.History of alcohol use.  Counseling 11.New findings type 2 diabetes mellitus.Hemoglobin A1c 6.5. SSI. Diabetic teaching  Metformin 250 daily started on 5/19, increased to 500 on 5/22 CBG (last 3)  Recent Labs    06/17/18 1659 06/17/18 2128 06/18/18 0638  GLUCAP 148* 167* 172*   ?  Trending up, will increase tomorrow if persistently elevated 12.  Hyponatremia  Sodium 134 on 5/20, labs ordered for tomorrow  Continue to monitor 13.  Sleep disturbance  Melatonin started on 5/26, increased on 5/27 14.  Constipation  MiraLAX daily started on 5/27  LOS: 13 days A FACE TO FACE EVALUATION WAS PERFORMED   Karis Juba 06/18/2018, 9:28 AM

## 2018-06-18 NOTE — Patient Care Conference (Signed)
Inpatient RehabilitationTeam Conference and Plan of Care Update Date: 06/18/2018   Time: 2:15 PM    Patient Name: Joe Wood      Medical Record Number: [id number]  Date of Birth: January 23, 1950 Sex: Male         Room/Bed: 5N14C/5N14C-01 Payor Info: Payor: Marine scientist / Plan: [employer] MEDICARE / Product Type: *No Product type* /    Admitting Diagnosis: Gen Team  Lt CVA; 23-24days  Admit Date/Time:  06/05/2018  3:55 PM Admission Comments: No comment available   Primary Diagnosis:  <principal problem not specified> Principal Problem: <principal problem not specified>  Patient Active Problem List   Diagnosis Date Noted  . Slow transit constipation   . Sleep disturbance   . Labile blood pressure   . Labile blood glucose   . Cerebellar stroke, acute ([place])   . Acute cerebral infarction ([place])   . Hyponatremia   . Diabetes mellitus, new onset ([place])   . Essential hypertension   . History of alcohol abuse   . Basal ganglia stroke ([place]) 06/05/2018  . 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 Hypertensive emergency 06/04/2018  . Hyperlipidemia LDL goal <70 06/04/2018  . Diabetes mellitus, type II ([place]) 06/04/2018  . Alcohol abuse 06/04/2018  . Acute ischemic stroke [facility]) - s/p tPA, d/t large vessel disease 05/29/2018    Expected Discharge Date: Expected Discharge Date: 06/27/18  Team Members Present: Physician leading conference: Dr. Delice Lesch Social Worker Present: Ovidio Kin, LCSW Nurse Present: Leonette Nutting, RN PT Present: Barrie Folk, PT OT Present: Simonne Come, OT SLP Present: Weston Anna, SLP PPS Coordinator present : Gunnar Fusi     Current Status/Progress Goal Weekly Team Focus  Medical   Right side weakness facial droop with dysarthria secondary to bilateral punctate infarcts with largest infarct in left leftBG/CR on 05/29/2018.    Improve mobility, DM/BP, electrolytes, consiptation, sleep  See above   Bowel/Bladder   Continent and  incontinent of bladder episodes encourgage urinal, LBM 06/16/2018   restore continence   Assess and address toileting issues/concerns QS and prn   Swallow/Nutrition/ Hydration             ADL's   mod assist bathing, LB dressing, toilet transfers, min assist UB dressing  min A dynamic stand balance, LB self care, toilet and tub shower transfer; S with cognition and UB self care  ADL retraining, transfers, sit > stand, dynamic standing balance, RUE NMR and awareness   Mobility   supervision for wheelcair (including turns), min/mod for stand pivot transfers with RW (varies based on pt fatigue)  min A gait, supervision w/c level  NMR, balance, gait   Communication   Mod I  Mod I   Goal Met   Safety/Cognition/ Behavioral Observations  Mod A   Min A   complex problem solving and recall with use of strategies    Pain   No c/o pain   remain pain free   Assess QS/PRN,    Skin   No skin issues  remain intact and free of infection   QS assess and address new concerns/issues      *See Care Plan and progress notes for long and short-term goals.     Barriers to Discharge  Current Status/Progress Possible Resolutions Date Resolved   Physician    Medical stability     See above  Therapies, optimize DM/HTN meds, follow labs, optimize sleep/bowel meds      Nursing  PT                    OT                  SLP                SW                Discharge Planning/Teaching Needs:  Wife and son to come in Tuesday in prepartion for DC next Friday. Aware pt will require 24 hr physical care at DC      Team Discussion:  Progressing toward his goals of min/mod assist. R-awareness issues. Fatigues affects his levels. Telesitter now due to fall last night. Confused at times and will need close supervision at discharge. MD adjusting BP and started on sleep meds. Waxes and wanes in his levels. Family education Tuesday with wife and son. R-AFO ordered Memory impaired.   Revisions to  Treatment Plan:  DC 6/5    Continued Need for Acute Rehabilitation Level of Care: The patient requires daily medical management by a physician with specialized training in physical medicine and rehabilitation for the following conditions: Daily direction of a multidisciplinary physical rehabilitation program to ensure safe treatment while eliciting the highest outcome that is of practical value to the patient.: Yes Daily medical management of patient stability for increased activity during participation in an intensive rehabilitation regime.: Yes Daily analysis of laboratory values and/or radiology reports with any subsequent need for medication adjustment of medical intervention for : Neurological problems;Diabetes problems;Blood pressure problems;Other   I attest that I was present, lead the team conference, and concur with the assessment and plan of the team. Teleconference held due to COVID 19   Cian Costanzo, Gardiner Rhyme 06/19/2018, 9:55 AM

## 2018-06-18 NOTE — Plan of Care (Signed)
  Problem: RH SAFETY Goal: RH STG ADHERE TO SAFETY PRECAUTIONS W/ASSISTANCE/DEVICE Description STG Adhere to Safety Precautions With Mod Assistance and appropriate assistive Device.  Outcome: Progressing  Call light at hand, bed alarm, tele sitter.  Problem: RH KNOWLEDGE DEFICIT Goal: RH STG INCREASE KNOWLEDGE OF DIABETES Description Patient will demonstrate knowledge on diabetes medications, dietary restrictions, and blood sugar parameters to rehab staff with min assist. Patient will follow up with MD after discharge.  Outcome: Progressing  Redirect, continue to educate.

## 2018-06-18 NOTE — Progress Notes (Signed)
Tele-sitter monitoring continue, w/o distress or agitation, resting at frequent interval, denies pain, call bell noted in place.. refer to assessment data sheet prn

## 2018-06-19 ENCOUNTER — Inpatient Hospital Stay (HOSPITAL_COMMUNITY): Payer: Medicare Other | Admitting: Speech Pathology

## 2018-06-19 ENCOUNTER — Inpatient Hospital Stay (HOSPITAL_COMMUNITY): Payer: Medicare Other | Admitting: Physical Therapy

## 2018-06-19 ENCOUNTER — Inpatient Hospital Stay (HOSPITAL_COMMUNITY): Payer: Medicare Other

## 2018-06-19 ENCOUNTER — Inpatient Hospital Stay (HOSPITAL_COMMUNITY): Payer: Medicare Other | Admitting: Occupational Therapy

## 2018-06-19 LAB — CBC WITH DIFFERENTIAL/PLATELET
Abs Immature Granulocytes: 0.03 10*3/uL (ref 0.00–0.07)
Basophils Absolute: 0.1 10*3/uL (ref 0.0–0.1)
Basophils Relative: 1 %
Eosinophils Absolute: 0.2 10*3/uL (ref 0.0–0.5)
Eosinophils Relative: 2 %
HCT: 37.5 % — ABNORMAL LOW (ref 39.0–52.0)
Hemoglobin: 13.2 g/dL (ref 13.0–17.0)
Immature Granulocytes: 0 %
Lymphocytes Relative: 18 %
Lymphs Abs: 1.2 10*3/uL (ref 0.7–4.0)
MCH: 28 pg (ref 26.0–34.0)
MCHC: 35.2 g/dL (ref 30.0–36.0)
MCV: 79.4 fL — ABNORMAL LOW (ref 80.0–100.0)
Monocytes Absolute: 0.7 10*3/uL (ref 0.1–1.0)
Monocytes Relative: 10 %
Neutro Abs: 4.6 10*3/uL (ref 1.7–7.7)
Neutrophils Relative %: 69 %
Platelets: 354 10*3/uL (ref 150–400)
RBC: 4.72 MIL/uL (ref 4.22–5.81)
RDW: 12.6 % (ref 11.5–15.5)
WBC: 6.7 10*3/uL (ref 4.0–10.5)
nRBC: 0 % (ref 0.0–0.2)

## 2018-06-19 LAB — GLUCOSE, CAPILLARY
Glucose-Capillary: 151 mg/dL — ABNORMAL HIGH (ref 70–99)
Glucose-Capillary: 151 mg/dL — ABNORMAL HIGH (ref 70–99)
Glucose-Capillary: 131 mg/dL — ABNORMAL HIGH (ref 70–99)
Glucose-Capillary: 122 mg/dL — ABNORMAL HIGH (ref 70–99)

## 2018-06-19 LAB — BASIC METABOLIC PANEL
Anion gap: 10 (ref 5–15)
BUN: 21 mg/dL (ref 8–23)
CO2: 24 mmol/L (ref 22–32)
Calcium: 10 mg/dL (ref 8.9–10.3)
Chloride: 99 mmol/L (ref 98–111)
Creatinine, Ser: 1.18 mg/dL (ref 0.61–1.24)
GFR calc Af Amer: >60 mL/min (ref >60)
GFR calc non Af Amer: >60 mL/min (ref >60)
Glucose, Bld: 169 mg/dL — ABNORMAL HIGH (ref 70–99)
Potassium: 4 mmol/L (ref 3.5–5.1)
Sodium: 133 mmol/L — ABNORMAL LOW (ref 135–145)

## 2018-06-19 MED ORDER — METFORMIN HCL 500 MG PO TABS
500.0000 mg | ORAL_TABLET | Freq: Two times a day (BID) | ORAL | Status: DC
  Administered 2018-06-19 – 2018-06-27 (×16): 500 mg via ORAL
  Filled 2018-06-19 (×16): qty 1

## 2018-06-19 NOTE — Plan of Care (Signed)
  Problem: Consults Goal: RH STROKE PATIENT EDUCATION Description See Patient Education module for education specifics  Outcome: Progressing   Problem: RH BOWEL ELIMINATION Goal: RH STG MANAGE BOWEL WITH ASSISTANCE Description STG Manage Bowel with Mod I Assistance.  Outcome: Progressing Goal: RH STG MANAGE BOWEL W/MEDICATION W/ASSISTANCE Description STG Manage Bowel with Medication with Min Assistance.  Outcome: Progressing   Problem: RH BLADDER ELIMINATION Goal: RH STG MANAGE BLADDER WITH ASSISTANCE Description STG Manage Bladder With Mod I Assistance  Outcome: Progressing   Problem: RH SKIN INTEGRITY Goal: RH STG MAINTAIN SKIN INTEGRITY WITH ASSISTANCE Description STG Maintain Skin Integrity With Mod Assistance.  Outcome: Progressing   Problem: RH SAFETY Goal: RH STG ADHERE TO SAFETY PRECAUTIONS W/ASSISTANCE/DEVICE Description STG Adhere to Safety Precautions With Mod Assistance and appropriate assistive Device.  Outcome: Progressing   Problem: RH PAIN MANAGEMENT Goal: RH STG PAIN MANAGED AT OR BELOW PT'S PAIN GOAL Description <3 on a 0-10 pain scale  Outcome: Progressing   Problem: RH KNOWLEDGE DEFICIT Goal: RH STG INCREASE KNOWLEDGE OF DIABETES Description Patient will demonstrate knowledge on diabetes medications, dietary restrictions, and blood sugar parameters to rehab staff with min assist. Patient will follow up with MD after discharge.  Outcome: Progressing Goal: RH STG INCREASE KNOWLEDGE OF HYPERTENSION Description Patient will demonstrate knowledge of HTN medications, dietary restrictions, and BP parameters with min assist from rehab staff. Patient will follow up with MD after discharge.   Outcome: Progressing Goal: RH STG INCREASE KNOWLEDGE OF STROKE PROPHYLAXIS Description Patient will demonstrate knowledge of medications used to help prevent future strokes with min assist from rehab staff and follow up with MD after discharge.  Outcome:  Progressing   

## 2018-06-19 NOTE — Progress Notes (Signed)
Physical Therapy Session Note  Patient Details  Name: Joe Wood MRN: [id number] Date of Birth: 12-Nov-1950  Today's Date: 06/19/2018 PT Individual Time: 1445-1600 PT Individual Time Calculation (min): 75 min   Short Term Goals: Week 2:  PT Short Term Goal 1 (Week 2): pt to perform gait x 25' with min A in controlled environment PT Short Term Goal 2 (Week 2): pt consistently min A with functional transfers  Skilled Therapeutic Interventions/Progress Updates:    Patient in w/c in room.  Assisted in hallway to storage room for slipper sock.  Patient donned L sock with min A seated in w/c.  Patient sit to stand CGA and ambulated wirh RW and Ace wrap to R foot x 52' with A for R hip stability in stance for decreasing R knee hyperextension.  Also cues for posture and facilitation for trunk extension.    Patient seated EOM for donning belt per his request. Then work on seated balance and trunk activation seated on sit disc to work on lateral weight shifts with facilitation for rib activation and opposite trunk elongation.  Then for ant/post pelvic tilts.  Ant tilt with arms crossed for marching and knee extension min a for balance.   Patient standing at counter for reaching for clothes pins on R with R UE and placing on top of window at desk with A for R UE reaching, cues for posture, trunk activation and assist for balance.   Stand step transfer with RW and mod A to Nu Step.  Seated for 8 min UE/LE at level 4.  Propelled w/c long way to room 150' with hemi technique and S.  Stand pivot with bed rail and CGA to bed,  Sit to supine min A for positioning in supine and left with call bell in reach and bed alarm activated.   Therapy Documentation Precautions:  Precautions Precautions: Fall Precaution Comments: R lean and decreased R side awareness Restrictions Weight Bearing Restrictions: No Pain: Pain Assessment Pain Score: 0-No pain    Therapy/Group: Individual Therapy  Elray Mcgregor   Sheran Lawless, PT 06/19/2018, 3:46 PM

## 2018-06-19 NOTE — Progress Notes (Signed)
[facility] PHYSICAL MEDICINE & REHABILITATION PROGRESS NOTE  Subjective/Complaints: Patient seen laying in bed this morning.  He does not have any clothes on.  He states he was warm last night.  He states he slept well overnight.  ROS: Denies CP, shortness of breath, nausea, vomiting, diarrhea.  Objective: Vital Signs: Blood pressure (!) 138/91, pulse 75, temperature 98.8 F (37.1 C), resp. rate 18, height 5\' 8"  (1.727 m), weight 81.2 kg, SpO2 97 %. No results found. Recent Labs    06/19/18 0511  WBC 6.7  HGB 13.2  HCT 37.5*  PLT 354   Recent Labs    06/19/18 0511  NA 133*  K 4.0  CL 99  CO2 24  GLUCOSE 169*  BUN 21  CREATININE 1.18  CALCIUM 10.0    Physical Exam: BP (!) 138/91 (BP Location: Right Arm)   Pulse 75   Temp 98.8 F (37.1 C)   Resp 18   Ht 5\' 8"  (1.727 m)   Wt 81.2 kg   SpO2 97%   BMI 27.23 kg/m  Constitutional: No distress . Vital signs reviewed. HENT: Cephalic.  Atraumatic. Eyes: EOMI.  No discharge.   Cardiovascular: No JVD. Respiratory: Normal effort. GI: Non-distended. Musc: No edema or tenderness in extremities. Neurological:  Alert and oriented Makes eye contact with examiner.  Dysarthria, unchanged Facial droop, unchanged Follows commands Motor:  RUE: Shoulder abduction 2+/5, distally 3+-4-/5 with apraxia, stable Right lower extremity: Hip flexion, knee extension 4-/5, ankle dorsiflexion 2-2+/5, stable Skin: Warm and dry.  Intact. Psych: Normal mood.  Normal behavior.  Assessment/Plan: 1. Functional deficits secondary to bilateral brain infarcts which require 3+ hours per day of interdisciplinary therapy in a comprehensive inpatient rehab setting.  Physiatrist is providing close team supervision and 24 hour management of active medical problems listed below.  Physiatrist and rehab team continue to assess barriers to discharge/monitor patient progress toward functional and medical goals  Care Tool:  Bathing    Body parts  bathed by patient: Chest, Abdomen, Front perineal area, Right upper leg, Left upper leg, Left lower leg, Face, Right arm, Right lower leg, Buttocks, Left arm   Body parts bathed by helper: Left arm     Bathing assist Assist Level: Minimal Assistance - Patient > 75%(min A with standing)     Upper Body Dressing/Undressing Upper body dressing Upper body dressing/undressing activity did not occur (including orthotics): N/A What is the patient wearing?: Pull over shirt    Upper body assist Assist Level: Set up assist    Lower Body Dressing/Undressing Lower body dressing      What is the patient wearing?: Pants     Lower body assist Assist for lower body dressing: Minimal Assistance - Patient > 75%     Toileting Toileting Toileting Activity did not occur (Clothing management and hygiene only): Safety/medical concerns  Toileting assist Assist for toileting: Moderate Assistance - Patient 50 - 74%     Transfers Chair/bed transfer  Transfers assist     Chair/bed transfer assist level: Moderate Assistance - Patient 50 - 74% Chair/bed transfer assistive device: Armrests   Locomotion Ambulation   Ambulation assist      Assist level: Moderate Assistance - Patient 50 - 74% Assistive device: Walker-rolling Max distance: 45   Walk 10 feet activity   Assist     Assist level: Moderate Assistance - Patient - 50 - 74% Assistive device: Walker-rolling, Orthosis   Walk 50 feet activity   Assist Walk 50 feet with 2 turns  activity did not occur: Safety/medical concerns         Walk 150 feet activity   Assist Walk 150 feet activity did not occur: Safety/medical concerns         Walk 10 feet on uneven surface  activity   Assist Walk 10 feet on uneven surfaces activity did not occur: Safety/medical concerns         Wheelchair     Assist   Type of Wheelchair: Manual    Wheelchair assist level: Supervision/Verbal cueing Max wheelchair distance: 200     Wheelchair 50 feet with 2 turns activity    Assist        Assist Level: Supervision/Verbal cueing   Wheelchair 150 feet activity     Assist Wheelchair 150 feet activity did not occur: Safety/medical concerns   Assist Level: Supervision/Verbal cueing         Medical Problem List and Plan: 1.Right side weakness facial droop with dysarthriasecondary to bilateral punctate infarcts with largest infarct in left leftBG/CR on 05/29/2018. Status post TPA. 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  Continue CIR 2. Antithrombotics: -DVT/anticoagulation:Subcutaneous Lovenox -antiplatelet therapy: Aspirin 81 mg daily, Plavix 75 mg daily x3 months then Plavix alone   CBC within acceptable range on 5/28 3. Pain Management:Tylenol as needed 4. Mood:Provide emotional support -antipsychotic agents: N/A 5. Neuropsych: This patientiscapable of making decisions on hisown behalf.  Continue tele-sitter ordered for safety 6. Skin/Wound Care:Routine skin checks 7. Fluids/Electrolytes/Nutrition:Routine in and outs  8.Hypertension with questionable medical noncompliance. HCTZ 12.5 mg daily  Lopressor 25 mg twice daily, increased to 37.5 twice daily on 5/20  Norvasc 10 mg daily.  Vitals:   06/18/18 2013 06/19/18 0545  BP: 131/84 (!) 138/91  Pulse: 97 75  Resp: 19 18  Temp: 98.8 F (37.1 C)   SpO2: 99% 97%   Controlled on 5/28  Monitor with increased mobility 9.Hyperlipidemia. Lipitor 10.History of alcohol use.  Counseling 11.New findings type 2 diabetes mellitus.Hemoglobin A1c 6.5. SSI. Diabetic teaching  Metformin 250 daily started on 5/19, increased to 500 on 5/22, increased to twice daily on 5/28 CBG (last 3)  Recent Labs    06/18/18 1642 06/18/18 2137 06/19/18 0607  GLUCAP 112* 132* 151*  12.  Hyponatremia  Sodium 133 on 5/28  Continue to monitor 13.  Sleep disturbance  Melatonin  started on 5/26, increased on 5/27 14.  Constipation  MiraLAX daily started on 5/27  LOS: 14 days A FACE TO FACE EVALUATION WAS PERFORMED  Brycelyn Gambino Karis Juba 06/19/2018, 9:41 AM

## 2018-06-19 NOTE — Progress Notes (Signed)
Physical Therapy Session Note  Patient Details  Name: Joe Wood MRN: [id number] Date of Birth: 01/07/1951  Today's Date: 06/19/2018 PT Individual Time: 1300-1330 PT Individual Time Calculation (min): 30 min   Short Term Goals: Week 2:  PT Short Term Goal 1 (Week 2): pt to perform gait x 25' with min A in controlled environment PT Short Term Goal 2 (Week 2): pt consistently min A with functional transfers  Skilled Therapeutic Interventions/Progress Updates:   Pt received sitting in WC and agreeable to PT. PT instructed pt in gait training with RW and RAFO x 18f and min-mod assist for weight shifting, posture, and pelvic rotation. Sit<>stand from WRio [name] with min assist and moderate cues for L weight shift, and proper UE placement to improve success of transfer. Additional gait training with orthotist present x 223fand min assist from PT. Pt noted to have improved weight shift L, but decreased knee control on this bout. Orthotist recommends increased stability in AFO with heel wedge to reduce GR.  [facility] returned to [facility] and [facility] and all needs met.        Therapy Documentation Precautions:  Precautions Precautions: Fall Precaution Comments: R lean and decreased R side awareness Restrictions Weight Bearing Restrictions: No Pain: denies   Therapy/Group: Individual Therapy  AuLorie Phenix/28/2020, 1:37 PM

## 2018-06-19 NOTE — Progress Notes (Signed)
Occupational Therapy Session Note  Patient Details  Name: Joe Wood MRN: [id number] Date of Birth: 07/01/1950  Today's Date: 06/19/2018 OT Individual Time: 1194-1740 OT Individual Time Calculation (min): 60 min    Short Term Goals: Week 2:  OT Short Term Goal 1 (Week 2): Pt will be able to sit safely on tub bench with S while engaging in bathing OT Short Term Goal 2 (Week 2): Pt will demonstrate improved R side awareness to don shirt over R arm with min cues.  OT Short Term Goal 3 (Week 2): Pt will complete toilet transfers min assist consistently OT Short Term Goal 4 (Week 2): Pt will complete bathing with min assist  Skilled Therapeutic Interventions/Progress Updates:    Pt seen for ADL training of shower and dressing with a focus on balance and use of RUE.  See ADL documentation below for detail.  Overall, excellent improvement with postural control sitting EOB and on tub bench (S).  CGA only when reaching to feet for extra safety but pt stated he felt secure leaning forward.  Pt completed stand pivot from bed to w/c with decreased attention to R leg and had it extended vs pivoted under him. Cued pt to actively step with R leg getting on and off tub bench and he was able to do this.  Used R hand on bar to stand in shower to doff brief and stand to wash bottom.Marland Kitchen MIn A to support standing balance while pt washed his bottom and front area.  Pt also actively used R arm to wash L arm. Bathing min A for balance only.  Pt then donned shirt with set up only, no verbal cues needed. Pt donned R arm and leg in clothing first demonstrating food improvement with R side awareness.   Pt stood up and needed min A to maintain balance with R hand on bed rail to pull pants up with left hand. Pt did need A to fully pull pants up and fasten zipper and button.  TED hose donned for pt.  He donned L shoe and tied independently.  R shoe has AFO so needed mod A to don shoe.  He then tied R shoe himself.    Excellent progress with RUE function and AROM.    Pt resting in wc with lap alarm belt and telesitter on.   Therapy Documentation Precautions:  Precautions Precautions: Fall Precaution Comments: R lean and decreased R side awareness Restrictions Weight Bearing Restrictions: No    Vital Signs: Therapy Vitals Pulse Rate: 75 Resp: 18 BP: (!) 138/91 Patient Position (if appropriate): Lying Oxygen Therapy SpO2: 97 % O2 Device: Room Air Pain: Pain Assessment Pain Score: 0-No pain ADL: ADL Eating: Set up Grooming: Setup Where Assessed-Grooming: Sitting at sink Upper Body Bathing: Supervision/safety Where Assessed-Upper Body Bathing: Shower Lower Body Bathing: Minimal assistance(min A to stand in shower to wash all body parts) Where Assessed-Lower Body Bathing: Shower Upper Body Dressing: Setup Where Assessed-Upper Body Dressing: Wheelchair Lower Body Dressing: Moderate assistance Where Assessed-Lower Body Dressing: Secondary school teacher: Insurance underwriter Method: Warden/ranger: Emergency planning/management officer, Grab bars   Therapy/Group: Individual Therapy  Joe Wood 06/19/2018, 9:21 AM

## 2018-06-19 NOTE — Progress Notes (Signed)
Speech Language Pathology Daily Session Note  Patient Details  Name: Joe Wood MRN: [id number] Date of Birth: August 05, 1950  Today's Date: 06/19/2018 SLP Individual Time: 1030-1110 SLP Individual Time Calculation (min): 40 min  Short Term Goals: Week 2: SLP Short Term Goal 1 (Week 2): Patient will articulate at the phrase level with >95% intelligibility utilizing compensatory strategies (i.e. slow rate, overarticulation, breath support) given moderate cues in order to increase independent communication skills. SLP Short Term Goal 2 (Week 2): Patient will recall 4/4 WRAP memory strategies and utilize at least x1 strategy during a functional structured or unstructured task with 50% accuracy.  SLP Short Term Goal 3 (Week 2): Patient will complete mildly complex, functional problem solving tasks with 75% accuracy and moderate cues in order to increase safety in the home environment.  Skilled Therapeutic Interventions: Skilled treatment session focused on cognitive goals. SLP facilitated session by initiating a memory notebook in order to maximize recall and carryover of information performed/disscussed within therapy sessions. Patient required Mod verbal cues to recall events from previous OT therapy session. Patient reported he had a shower but didn't consider that therapy. SLP provided education in reasoning to perform shower during session like safety, balance and hemi-dressing techniques. Patient verbalized understanding but also reported he doesn't need to use those right now because he can "still do it the old way." Patient continues to require overall Mod-Max A verbal cues for awareness of deficits and their impact on his function at times. Patient left upright in the wheelchair with alarm on and all needs within reach. Continue with current plan of care.      Pain No/Denies Pain   Therapy/Group: Individual Therapy  [name] 06/19/2018, 2:36 PM

## 2018-06-20 ENCOUNTER — Inpatient Hospital Stay (HOSPITAL_COMMUNITY): Payer: Medicare Other | Admitting: Physical Therapy

## 2018-06-20 ENCOUNTER — Inpatient Hospital Stay (HOSPITAL_COMMUNITY): Payer: Medicare Other | Admitting: Speech Pathology

## 2018-06-20 ENCOUNTER — Inpatient Hospital Stay (HOSPITAL_COMMUNITY): Payer: Medicare Other | Admitting: Occupational Therapy

## 2018-06-20 ENCOUNTER — Inpatient Hospital Stay (HOSPITAL_COMMUNITY): Payer: Medicare Other

## 2018-06-20 LAB — GLUCOSE, CAPILLARY
Glucose-Capillary: 142 mg/dL — ABNORMAL HIGH (ref 70–99)
Glucose-Capillary: 111 mg/dL — ABNORMAL HIGH (ref 70–99)
Glucose-Capillary: 119 mg/dL — ABNORMAL HIGH (ref 70–99)
Glucose-Capillary: 127 mg/dL — ABNORMAL HIGH (ref 70–99)

## 2018-06-20 NOTE — Progress Notes (Signed)
Occupational Therapy Session Note  Patient Details  Name: Joe Wood MRN: [id number] Date of Birth: 1950/07/12  Today's Date: 06/20/2018 OT Individual Time: 0800-0900 OT Individual Time Calculation (min): 60 min    Short Term Goals: Week 1:  OT Short Term Goal 1 (Week 1): Pt will be able to sit safely on tub bench with S to be able to bathe LB with mod A. OT Short Term Goal 1 - Progress (Week 1): Progressing toward goal OT Short Term Goal 2 (Week 1): Pt will be able to transfer to toilet with mod A of 1.  OT Short Term Goal 2 - Progress (Week 1): Met OT Short Term Goal 3 (Week 1): Pt will demonstrate improved R side awareness to don shirt over R arm with min cues.  OT Short Term Goal 3 - Progress (Week 1): Progressing toward goal OT Short Term Goal 4 (Week 1): Pt will demonstrate improved motor planning to don shirt with min A and min cues.  OT Short Term Goal 4 - Progress (Week 1): Met OT Short Term Goal 5 (Week 1): Pt will be able to sit to stand for clothing management with mod A.  OT Short Term Goal 5 - Progress (Week 1): Met  Skilled Therapeutic Interventions/Progress Updates:    Patient in bed upon therapy arrival and agreeable to participate in OT treatment session. Pt requested to take a shower. Able to transition from supine to sitting on EOB with Supervision. Stand pivot transfer completed from bed to wheelchair with contact guard assist and no device. Pt utilized the grab bars to complete a stand pivot transfer from wheelchair to shower bench. There he remained sitting while completing UB bathing and LB bathing. Close contact guard assist was provided when standing to wash his peri area and buttocks. Before any sit to stand in the shower VC were provided for technique and safety. Throughout bathing, patient was encouraged to utilize his right UE as much as possible to hold grab bars, wash, manage shower hose, etc. Patient completed dressing while seated in his wheelchair at  sink. Required Min assist for UB dressing and LB dressing. Provided education for hemi dressing technique for upper and lower body dressing. Increased difficulty with donning right sock. Therapist held patient's right leg over left and placed his sock over his toes to complete task. Patient brushed his teeth while utilizing his right hand as a stabilizer when opening toothpaste and then when placing toothpaste on brush. Overall, patient was able to utilize his RUE for all tasks 25% of the time. Pt was left in his wheelchair with call light within reach and seat belt alarm on at end of session.   Therapy Documentation Precautions:  Precautions Precautions: Fall Precaution Comments: R lean and decreased R side awareness Restrictions Weight Bearing Restrictions: No Pain: Pain Assessment Pain Scale: 0-10 Pain Score: 0-No pain   Therapy/Group: Individual Therapy  Ailene Ravel, OTR/L,CBIS  [phone]  06/20/2018, 12:29 PM

## 2018-06-20 NOTE — Progress Notes (Signed)
[place] [facility] PHYSICAL MEDICINE & REHABILITATION PROGRESS NOTE  Subjective/Complaints: Patient seen sitting up working with therapy this morning.  He states he slept well overnight.  He notes improvement in strength.  He states he had a BM this AM.  ROS: Denies CP, shortness of breath, nausea, vomiting, diarrhea.  Objective: Vital Signs: Blood pressure 123/78, pulse 64, temperature 98.4 F (36.9 C), temperature source Oral, resp. rate 20, height 5\' 8"  (1.727 m), weight 81.2 kg, SpO2 100 %. No results found. Recent Labs    06/19/18 0511  WBC 6.7  HGB 13.2  HCT 37.5*  PLT 354   Recent Labs    06/19/18 0511  NA 133*  K 4.0  CL 99  CO2 24  GLUCOSE 169*  BUN 21  CREATININE 1.18  CALCIUM 10.0    Physical Exam: BP 123/78 (BP Location: Right Arm)   Pulse 64   Temp 98.4 F (36.9 C) (Oral)   Resp 20   Ht 5\' 8"  (1.727 m)   Wt 81.2 kg   SpO2 100%   BMI 27.23 kg/m  Constitutional: No distress . Vital signs reviewed. HENT: Normocephalic.  Atraumatic. Eyes: EOMI.  No discharge.   Cardiovascular: No JVD. Respiratory: Normal effort. GI: Non-distended. Musc: No edema or tenderness in extremities. Neurological:  Alert and oriented Makes eye contact with examiner.  Dysarthria, stable Facial droop, stable Follows commands Motor:  RUE: Shoulder abduction 4-/5, distally 4/5 with apraxia Right lower extremity: Hip flexion, knee extension 4-/5, ankle dorsiflexion 2-2+/5, stable Skin: Warm and dry.  Intact. Psych: Normal mood.  Normal behavior.  Assessment/Plan: 1. Functional deficits secondary to bilateral brain infarcts which require 3+ hours per day of interdisciplinary therapy in a comprehensive inpatient rehab setting.  Physiatrist is providing close team supervision and 24 hour management of active medical problems listed below.  Physiatrist and rehab team continue to assess barriers to discharge/monitor patient progress toward functional and medical goals  Care  Tool:  Bathing    Body parts bathed by patient: Chest, Abdomen, Front perineal area, Right upper leg, Left upper leg, Left lower leg, Face, Right arm, Right lower leg, Buttocks, Left arm   Body parts bathed by helper: Left arm     Bathing assist Assist Level: Minimal Assistance - Patient > 75%(min A with standing)     Upper Body Dressing/Undressing Upper body dressing Upper body dressing/undressing activity did not occur (including orthotics): N/A What is the patient wearing?: Pull over shirt    Upper body assist Assist Level: Set up assist    Lower Body Dressing/Undressing Lower body dressing      What is the patient wearing?: Pants     Lower body assist Assist for lower body dressing: Minimal Assistance - Patient > 75%     Toileting Toileting Toileting Activity did not occur (Clothing management and hygiene only): Safety/medical concerns  Toileting assist Assist for toileting: Moderate Assistance - Patient 50 - 74%     Transfers Chair/bed transfer  Transfers assist     Chair/bed transfer assist level: Minimal Assistance - Patient > 75% Chair/bed transfer assistive device: Armrests   Locomotion Ambulation   Ambulation assist      Assist level: Moderate Assistance - Patient 50 - 74% Assistive device: Walker-rolling(ace wrap R foot) Max distance: 52   Walk 10 feet activity   Assist     Assist level: Moderate Assistance - Patient - 50 - 74% Assistive device: Walker-rolling, Other (comment)(ace wrap R foot)   Walk 50 feet activity  Assist Walk 50 feet with 2 turns activity did not occur: Safety/medical concerns  Assist level: Moderate Assistance - Patient - 50 - 74% Assistive device: Walker-rolling(ace wrap R foot)    Walk 150 feet activity   Assist Walk 150 feet activity did not occur: Safety/medical concerns         Walk 10 feet on uneven surface  activity   Assist Walk 10 feet on uneven surfaces activity did not occur:  Safety/medical concerns         Wheelchair     Assist   Type of Wheelchair: Manual    Wheelchair assist level: Supervision/Verbal cueing Max wheelchair distance: 150    Wheelchair 50 feet with 2 turns activity    Assist        Assist Level: Supervision/Verbal cueing   Wheelchair 150 feet activity     Assist Wheelchair 150 feet activity did not occur: Safety/medical concerns   Assist Level: Supervision/Verbal cueing         Medical Problem List and Plan: 1.Right side weakness facial droop with dysarthriasecondary to bilateral punctate infarcts with largest infarct in left leftBG/CR on 05/29/2018. Status post TPA. 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  Continue CIR 2. Antithrombotics: -DVT/anticoagulation:Subcutaneous Lovenox -antiplatelet therapy: Aspirin 81 mg daily, Plavix 75 mg daily x3 months then Plavix alone   CBC within acceptable range on 5/28 3. Pain Management:Tylenol as needed 4. Mood:Provide emotional support -antipsychotic agents: N/A 5. Neuropsych: This patientiscapable of making decisions on hisown behalf.  Continue tele-sitter ordered for safety 6. Skin/Wound Care:Routine skin checks 7. Fluids/Electrolytes/Nutrition:Routine in and outs  8.Hypertension with questionable medical noncompliance. HCTZ 12.5 mg daily  Lopressor 25 mg twice daily, increased to 37.5 twice daily on 5/20  Norvasc 10 mg daily.  Vitals:   06/19/18 2044 06/20/18 0447  BP: (!) 145/87 123/78  Pulse: 88 64  Resp: 20 20  Temp: 97.7 F (36.5 C) 98.4 F (36.9 C)  SpO2: 99% 100%   Slightly labile on 5/29  Monitor with increased mobility 9.Hyperlipidemia. Lipitor 10.History of alcohol use.  Counseling 11.New findings type 2 diabetes mellitus.Hemoglobin A1c 6.5. SSI. Diabetic teaching  Metformin 250 daily started on 5/19, increased to 500 on 5/22, increased to twice daily on  5/28 CBG (last 3)  Recent Labs    06/19/18 1653 06/19/18 2157 06/20/18 0646  GLUCAP 131* 122* 142*   Remained overall elevated on 5/29 12.  Hyponatremia  Sodium 133 on 5/28  Continue to monitor 13.  Sleep disturbance  Melatonin started on 5/26, increased on 5/27 14.  Constipation  MiraLAX daily started on 5/27  Improving  LOS: 15 days A FACE TO FACE EVALUATION WAS PERFORMED  Khadeja Abt Karis JubaAnil Jessenya Berdan 06/20/2018, 10:04 AM

## 2018-06-20 NOTE — Progress Notes (Signed)
Social Work Patient ID: Joe Wood, male   DOB: 01/29/1950, 68 y.o.   MRN: [id number]      Diagnosis codes: I63.9 & E11.9  Height: 5'8         Weight: 187 lbs        Patient suffers from L-CVA   which impairs his ability to perform daily activities like toileitng and ADL's   in the home.  A walker  will not resolve issue with performing activities of daily living.  A wheelchair will allow patient to safely perform daily activities.  Patient is not able to propel themselves in the home using a standard weight wheelchair due to endurance and fatigue .  Patient can self propel in the lightweight wheelchair.

## 2018-06-20 NOTE — Progress Notes (Signed)
Speech Language Pathology Weekly Progress and Session Note  Patient Details  Name: [name] MRN: [id number] Date of Birth: November 24, 1950  Beginning of progress report period: Jun 13, 2018 End of progress report period: Jun 20, 2018  Today's Date: 06/20/2018 SLP Individual Time: 1400-1455 SLP Individual Time Calculation (min): 55 min  Short Term Goals: Week 2: SLP Short Term Goal 1 (Week 2): Patient will articulate at the phrase level with >95% intelligibility utilizing compensatory strategies (i.e. slow rate, overarticulation, breath support) given moderate cues in order to increase independent communication skills. SLP Short Term Goal 1 - Progress (Week 2): Met SLP Short Term Goal 2 (Week 2): Patient will recall 4/4 WRAP memory strategies and utilize at least x1 strategy during a functional structured or unstructured task with 50% accuracy.  SLP Short Term Goal 2 - Progress (Week 2): Not met SLP Short Term Goal 3 (Week 2): Patient will complete mildly complex, functional problem solving tasks with 75% accuracy and moderate cues in order to increase safety in the home environment. SLP Short Term Goal 3 - Progress (Week 2): Met    New Short Term Goals: Week 3: SLP Short Term Goal 1 (Week 3): Patient will recall 4/4 WRAP memory strategies and utilize at least x1 strategy during a functional structured or unstructured task with 50% accuracy.  SLP Short Term Goal 2 (Week 3): Patient will complete mildly complex, functional problem solving tasks with 75% accuracy and min cues in order to increase safety in the home environment.  Weekly Progress Updates: Patient continues to make stedy gains and has met 2 of 3 STGs this reporting period. Currently, patient is ~100% intelligible at the sentence level with overall Mod I. Patient demonstrates improved problem solving but continues to require overall Mod A verbal cues for recall with use of strategies and awareness of deficits and their impact on  his function at times. Patient and family education ongoing. Patient would benefit from continued skilled SLP intervention to maximize his cognitive functioning prior to discharge.      Intensity: Minumum of 1-2 x/day, 30 to 90 minutes Frequency: 3 to 5 out of 7 days Duration/Length of Stay: 06/27/18 Treatment/Interventions: Cognitive remediation/compensation;Cueing hierarchy;Functional tasks;Internal/external aids;Medication managment;Patient/family education;Speech/Language facilitation;Therapeutic Activities   Daily Session  Skilled Therapeutic Interventions:  Skilled treatment session focused on cognitive goals. SLP facilitated session by providing overall Mod A verbal cues for problem solving during a deductive reasoning task. Patient's memory notebook was filled out for the last 2 days and patient was able to utilize it to recall events from therapy sessions with Min A verbal cues. Patient left upright in wheelchair with alarm on and all needs within reach. Continue wth current plan of care.     Pain No/Denies Pain   Therapy/Group: Individual Therapy  Zonie Crutcher 06/20/2018, 6:38 AM

## 2018-06-20 NOTE — Progress Notes (Signed)
Physical Therapy Session Note  Patient Details  Name: Joe Wood MRN: [id number] Date of Birth: Sep 15, 1950  Today's Date: 06/20/2018 PT Individual Time: 4709-2957 PT Individual Time Calculation (min): 38 min   Short Term Goals: Week 1:  PT Short Term Goal 1 (Week 1): Patient to perform functional transfers consistently at mod A. PT Short Term Goal 1 - Progress (Week 1): Met PT Short Term Goal 2 (Week 1): Patient to demonstrate static standing balance with UE support and min A.  PT Short Term Goal 2 - Progress (Week 1): Progressing toward goal PT Short Term Goal 3 (Week 1): Patient to demonstrate static sitting balance with S.  PT Short Term Goal 3 - Progress (Week 1): Met PT Short Term Goal 4 (Week 1): Patient to ambulate 81' with LRAD and mod A. PT Short Term Goal 4 - Progress (Week 1): Met Week 2:  PT Short Term Goal 1 (Week 2): pt to perform gait x 25' with min A in controlled environment PT Short Term Goal 2 (Week 2): pt consistently min A with functional transfers  Skilled Therapeutic Interventions/Progress Updates:   Pt received sitting in WC and agreeable to PT. WC mobility x 131f. Stand pivot transfer to and from nustep with min assist moderate cues for sequencing, AD management, and tactile cues to prevent GR in the Rknee. nustep reciprocal movement training with mod-max multimodal cues for improve hip control on the R, midline orientation, full ROM and symmetry. Additional WC mobility x 2043fwith supervision assist with min cues for awareness of obstacles on the R side and improved safety in turns. Sit<>[facility] support on PT shoulder x 5 with mod assist to maintain midline. minisquats in standing with BUE support on PT 2 x 5 with mod assist to improve L weight shift and to prevent knee collapse on the R. Patient returned to room and [facility] bell in reach and all needs met.         Therapy Documentation Precautions:   Precautions Precautions: Fall Precaution Comments: R lean and decreased R side awareness Restrictions Weight Bearing Restrictions: No    Pain: Pain Assessment Pain Scale: 0-10 Pain Score: 0-No pain    Therapy/Group: Individual Therapy  AuLorie Phenix/29/2020, 12:32 PM

## 2018-06-20 NOTE — Progress Notes (Signed)
Occupational Therapy Weekly Progress Note  Patient Details  Name: Joe Wood MRN: [id number] Date of Birth: 1950/05/12  Beginning of progress report period: Jun 13, 2018 End of progress report period: Jun 20, 2018  Today's Date: 06/20/2018 OT Individual Time: 0930-1030 OT Individual Time Calculation (min): 60 min    Patient has met 3 of 4 short term goals.  Pt is making steady progress towards goals.  Pt currently fluctuates between min-mod assist with transfers.  Pt has demonstrated ability to complete bathing/dressing at overall min assist level.  Pt has demonstrated great improvements in sit > stand and standing balance as well as awareness of Rt side of body during functional tasks.  Pt continues to require increased time and effort with functional reaching with RUE due to limited shoulder and elbow ROM.  Pt continues to demonstrate decreased recall, however has shown ability to recall hemi-technique through repetition.  Patient continues to demonstrate the following deficits: decreased cardiorespiratoy endurance,abnormal tone, unbalanced muscle activation, motor apraxia and decreased coordination,decreased midline orientation and decreased attention to right,decreased awareness, decreased problem solving, decreased memory and delayed processingand decreased sitting balance, decreased standing balance, decreased postural control, hemiplegia and decreased balance strategies  and therefore will continue to benefit from skilled OT intervention to enhance overall performance with BADL and Reduce care partner burden.  Patient progressing toward long term goals..  Continue plan of care.  OT Short Term Goals Week 2:  OT Short Term Goal 1 (Week 2): Pt will be able to sit safely on tub bench with S while engaging in bathing OT Short Term Goal 1 - Progress (Week 2): Met OT Short Term Goal 2 (Week 2): Pt will demonstrate improved R side awareness to don shirt over R arm with min cues.  OT  Short Term Goal 2 - Progress (Week 2): Met OT Short Term Goal 3 (Week 2): Pt will complete toilet transfers min assist consistently OT Short Term Goal 3 - Progress (Week 2): Progressing toward goal OT Short Term Goal 4 (Week 2): Pt will complete bathing with min assist OT Short Term Goal 4 - Progress (Week 2): Met Week 3:  OT Short Term Goal 1 (Week 3): STG = LTGs due to remaining LOS  Skilled Therapeutic Interventions/Progress Updates:    Treatment session with focus on functional transfers, dynamic standing balance, and RUE NMR.  Pt received upright in w/c agreeable to therapy session.  Engaged in [place] in supine, sitting, and standing with focus on increased shoulder ROM when reaching away from body due to ?tone developing vs weakness.  Utilized dowel rod held in BUE in supine with focus on AAROM to promote symmetry with use of LUE.  Engaged in towel glides along mat with focus on shoulder abduction and then forward flexion.  Engaged in reaching task to obtain resistive clothespins in various planes to challenge functional reach and strengthening of grasp.  Pt completed all transfers throughout session at overall min assist level.  Returned to room and encouraged to remain seated upright until next session.  Applied seat belt alarm and left with all needs in reach.  Therapy Documentation Precautions:  Precautions Precautions: Fall Precaution Comments: R lean and decreased R side awareness Restrictions Weight Bearing Restrictions: No Pain: Pain Assessment Pain Scale: 0-10 Pain Score: 0-No pain   Therapy/Group: Individual Therapy  Simonne Come 06/20/2018, 1:48 PM

## 2018-06-21 ENCOUNTER — Inpatient Hospital Stay (HOSPITAL_COMMUNITY): Payer: Medicare Other | Admitting: Physical Therapy

## 2018-06-21 LAB — GLUCOSE, CAPILLARY
Glucose-Capillary: 141 mg/dL — ABNORMAL HIGH (ref 70–99)
Glucose-Capillary: 123 mg/dL — ABNORMAL HIGH (ref 70–99)
Glucose-Capillary: 144 mg/dL — ABNORMAL HIGH (ref 70–99)
Glucose-Capillary: 126 mg/dL — ABNORMAL HIGH (ref 70–99)

## 2018-06-21 NOTE — Progress Notes (Signed)
[facility] PHYSICAL MEDICINE & REHABILITATION PROGRESS NOTE  Subjective/Complaints: Patient seen laying in bed this morning he states he slept well overnight.  He notes improvement in left upper extremity strength.  ROS: Denies CP, shortness of breath, nausea, vomiting, diarrhea.  Objective: Vital Signs: Blood pressure 126/80, pulse 70, temperature 98.6 F (37 C), temperature source Oral, resp. rate 18, height 5\' 8"  (1.727 m), weight 81.2 kg, SpO2 98 %. No results found. Recent Labs    06/19/18 0511  WBC 6.7  HGB 13.2  HCT 37.5*  PLT 354   Recent Labs    06/19/18 0511  NA 133*  K 4.0  CL 99  CO2 24  GLUCOSE 169*  BUN 21  CREATININE 1.18  CALCIUM 10.0    Physical Exam: BP 126/80   Pulse 70   Temp 98.6 F (37 C) (Oral)   Resp 18   Ht 5\' 8"  (1.727 m)   Wt 81.2 kg   SpO2 98%   BMI 27.23 kg/m  Constitutional: No distress . Vital signs reviewed. HENT: Normocephalic.  Atraumatic. Eyes: EOMI.  No discharge.   Cardiovascular: No JVD. Respiratory: Normal effort. GI: Non-distended. Musc: No edema or tenderness in extremities. Neurological:  Alert  Makes eye contact with examiner.  Dysarthria, stable Facial droop, stable Follows commands Motor:  RUE: Shoulder abduction 4-/5, distally 4/5 with apraxia, improving Right lower extremity: Hip flexion, knee extension 4-/5, ankle dorsiflexion 2-2+/5 with apraxia Skin: Warm and dry.  Intact. Psych: Normal mood.  Normal behavior.  Assessment/Plan: 1. Functional deficits secondary to bilateral brain infarcts which require 3+ hours per day of interdisciplinary therapy in a comprehensive inpatient rehab setting.  Physiatrist is providing close team supervision and 24 hour management of active medical problems listed below.  Physiatrist and rehab team continue to assess barriers to discharge/monitor patient progress toward functional and medical goals  Care Tool:  Bathing    Body parts bathed by patient: Chest,  Abdomen, Front perineal area, Right upper leg, Left upper leg, Left lower leg, Face, Right arm, Right lower leg, Buttocks, Left arm   Body parts bathed by helper: Left arm     Bathing assist Assist Level: Contact Guard/Touching assist     Upper Body Dressing/Undressing Upper body dressing Upper body dressing/undressing activity did not occur (including orthotics): N/A What is the patient wearing?: Pull over shirt    Upper body assist Assist Level: Minimal Assistance - Patient > 75%    Lower Body Dressing/Undressing Lower body dressing      What is the patient wearing?: Pants, Incontinence brief     Lower body assist Assist for lower body dressing: Minimal Assistance - Patient > 75%(for brief donning)     Toileting Toileting Toileting Activity did not occur (Clothing management and hygiene only): Safety/medical concerns  Toileting assist Assist for toileting: Set up assist     Transfers Chair/bed transfer  Transfers assist     Chair/bed transfer assist level: Contact Guard/Touching assist Chair/bed transfer assistive device: Armrests, Bedrails   Locomotion Ambulation   Ambulation assist      Assist level: Moderate Assistance - Patient 50 - 74% Assistive device: Walker-rolling(ace wrap R foot) Max distance: 52   Walk 10 feet activity   Assist     Assist level: Moderate Assistance - Patient - 50 - 74% Assistive device: Walker-rolling, Other (comment)(ace wrap R foot)   Walk 50 feet activity   Assist Walk 50 feet with 2 turns activity did not occur: Safety/medical concerns  Assist level:  Moderate Assistance - Patient - 50 - 74% Assistive device: Walker-rolling(ace wrap R foot)    Walk 150 feet activity   Assist Walk 150 feet activity did not occur: Safety/medical concerns         Walk 10 feet on uneven surface  activity   Assist Walk 10 feet on uneven surfaces activity did not occur: Safety/medical concerns          Wheelchair     Assist   Type of Wheelchair: Manual    Wheelchair assist level: Supervision/Verbal cueing Max wheelchair distance: 150    Wheelchair 50 feet with 2 turns activity    Assist        Assist Level: Supervision/Verbal cueing   Wheelchair 150 feet activity     Assist Wheelchair 150 feet activity did not occur: Safety/medical concerns   Assist Level: Supervision/Verbal cueing         Medical Problem List and Plan: 1.Right side weakness facial droop with dysarthriasecondary to bilateral punctate infarcts with largest infarct in left leftBG/CR on 05/29/2018. Status post TPA. 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  Continue CIR  AFO ordered 2. Antithrombotics: -DVT/anticoagulation:Subcutaneous Lovenox -antiplatelet therapy: Aspirin 81 mg daily, Plavix 75 mg daily x3 months then Plavix alone   CBC within acceptable range on 5/28 3. Pain Management:Tylenol as needed 4. Mood:Provide emotional support -antipsychotic agents: N/A 5. Neuropsych: This patientiscapable of making decisions on hisown behalf.  Continue tele-sitter ordered for safety 6. Skin/Wound Care:Routine skin checks 7. Fluids/Electrolytes/Nutrition:Routine in and outs  8.Hypertension with questionable medical noncompliance. HCTZ 12.5 mg daily  Lopressor 25 mg twice daily, increased to 37.5 twice daily on 5/20  Norvasc 10 mg daily.  Vitals:   06/21/18 0904 06/21/18 0906  BP: 126/80   Pulse:  70  Resp:    Temp:    SpO2:     Controlled on 5/30  Monitor with increased mobility 9.Hyperlipidemia. Lipitor 10.History of alcohol use.  Counseling 11.New findings type 2 diabetes mellitus.Hemoglobin A1c 6.5. SSI. Diabetic teaching  Metformin 250 daily started on 5/19, increased to 500 on 5/22, increased to twice daily on 5/28 CBG (last 3)  Recent Labs    06/20/18 2235 06/21/18 0612 06/21/18 1144   GLUCAP 127* 141* 123*   Remains slightly elevated on 5/30 12.  Hyponatremia  Sodium 133 on 5/28  Continue to monitor 13.  Sleep disturbance  Melatonin started on 5/26, increased on 5/27  Improving 14.  Constipation  MiraLAX daily started on 5/27  Improving  LOS: 16 days A FACE TO FACE EVALUATION WAS PERFORMED  Ankit Karis Jubanil Patel 06/21/2018, 12:40 PM

## 2018-06-21 NOTE — Progress Notes (Signed)
Called spouse updated on pt condition. She was thankful.

## 2018-06-21 NOTE — Plan of Care (Signed)
  Problem: RH BLADDER ELIMINATION Goal: RH STG MANAGE BLADDER WITH ASSISTANCE Description STG Manage Bladder With Mod I Assistance  Outcome: Progressing  Assist with bathroom needs emptying urinal  Problem: RH KNOWLEDGE DEFICIT Goal: RH STG INCREASE KNOWLEDGE OF DIABETES Description Patient will demonstrate knowledge on diabetes medications, dietary restrictions, and blood sugar parameters to rehab staff with min assist. Patient will follow up with MD after discharge.  Outcome: Progressing  Educating pt on safety call light within reach, bed alarm, floor mats.

## 2018-06-21 NOTE — Progress Notes (Signed)
Physical Therapy Session Note  Patient Details  Name: Joe Wood MRN: [id number] Date of Birth: [birth year]  Today's Date: 06/21/2018 PT Individual Time: 0900-1000 PT Individual Time Calculation (min): 60 min   Short Term Goals: Week 2:  PT Short Term Goal 1 (Week 2): pt to perform gait x 25' with min A in controlled environment PT Short Term Goal 2 (Week 2): pt consistently min A with functional transfers  Skilled Therapeutic Interventions/Progress Updates:    Therapy Documentation Precautions:  Precautions Precautions: Fall Precaution Comments: R lean and decreased R side awareness Restrictions Weight Bearing Restrictions: No  Treatment: Pt in bed on arrival with RN in room working on getting am meds together. Denies any pain.   Total assist to don TED hose while lying in bed. Min assist with cues on technique for supine to sitting edge of bed with bed flat and no rail used. Once sitting up pt needed supervision with cues to scoot closer to edge of bed. Unsupported sitting at edge of bed while taking meds given by RN. Then shoes and right AFO donned by PTA. Pt able to assist with donning by pushing foot down into shoes. Total assist to tie shoes. Min assist with cues to stand from bed to RW with hand orthotic. Total assist to secure hand in orthotic. Min guard assist with RW for stand pivot transfer to wheechair with min guard assit for controlled descent into wheelchiar. Pt propelled self from room to NuStep in hallway with supervision and cues on technique and to use UE' s in addition to LE's. Min assist to stand from wheelchair, min assist for stand pivot transfer to NuStep. On NuStep at level 5 with UE/LE's for 10 minutes with gaoal>/= 45 steps per minute for strengthening and activity tolerance. Min assist to stand from NuStep to RW. Gait for ~60 feet from NuStep to room door with wheelchair follow. Mod assist for balance with cues for posture, increased right step length and foot  clearance (does have toe cap which allows foot to slide). Mod assist for controlled decent to wheelchair with cues for hand placement. Pt transported into room by PTA. Standing and stand pivot transfer from wheelchair to recliner performed with assistance as stated with NuStep. Seated in recliner with back support- right hamstring curls with level 2 band, then long arc quads with 3# ankle weight, both for 2 sets of 10 reps.   Pt left in recliner with belt alarm on, feet up and all needs in reach.    Therapy/Group: Individual Therapy  Livingston Diones, PTA, CLT 06/21/18, 3:42 PM

## 2018-06-22 LAB — GLUCOSE, CAPILLARY
Glucose-Capillary: 139 mg/dL — ABNORMAL HIGH (ref 70–99)
Glucose-Capillary: 114 mg/dL — ABNORMAL HIGH (ref 70–99)
Glucose-Capillary: 142 mg/dL — ABNORMAL HIGH (ref 70–99)
Glucose-Capillary: 140 mg/dL — ABNORMAL HIGH (ref 70–99)

## 2018-06-22 NOTE — Progress Notes (Signed)
[facility] PHYSICAL MEDICINE & REHABILITATION PROGRESS NOTE  Subjective/Complaints: Patient seen laying in bed this morning.  He states he slept well overnight  ROS: Denies CP, shortness of breath, nausea, vomiting, diarrhea.  Objective: Vital Signs: Blood pressure 136/76, pulse 80, temperature 98.3 F (36.8 C), temperature source Oral, resp. rate 16, height 5\' 8"  (1.727 m), weight 81.2 kg, SpO2 97 %. No results found. No results for input(s): WBC, HGB, HCT, PLT in the last 72 hours. No results for input(s): NA, K, CL, CO2, GLUCOSE, BUN, CREATININE, CALCIUM in the last 72 hours.  Physical Exam: BP 136/76   Pulse 80   Temp 98.3 F (36.8 C) (Oral)   Resp 16   Ht 5\' 8"  (1.727 m)   Wt 81.2 kg   SpO2 97%   BMI 27.23 kg/m  Constitutional: No distress . Vital signs reviewed. HENT: Normocephalic.  Atraumatic. Eyes: EOMI.  No discharge.   Cardiovascular: No JVD. Respiratory: Normal effort. GI: Non-distended. Musc: No edema or tenderness in extremities. Neurological:  Alert Makes eye contact with examiner.  Dysarthria, unchanged Facial droop, unchanged Follows commands Motor:  RUE: Shoulder abduction 4-/5, distally 4/5 with apraxia, improving Right lower extremity: Hip flexion, knee extension 4-/5, ankle dorsiflexion 2-2+/5 with apraxia, improving Skin: Warm and dry.  Intact. Psych: Normal mood.  Normal behavior.  Assessment/Plan: 1. Functional deficits secondary to bilateral brain infarcts which require 3+ hours per day of interdisciplinary therapy in a comprehensive inpatient rehab setting.  Physiatrist is providing close team supervision and 24 hour management of active medical problems listed below.  Physiatrist and rehab team continue to assess barriers to discharge/monitor patient progress toward functional and medical goals  Care Tool:  Bathing    Body parts bathed by patient: Chest, Abdomen, Front perineal area, Right upper leg, Left upper leg, Left lower leg,  Face, Right arm, Right lower leg, Buttocks, Left arm   Body parts bathed by helper: Left arm     Bathing assist Assist Level: Contact Guard/Touching assist     Upper Body Dressing/Undressing Upper body dressing Upper body dressing/undressing activity did not occur (including orthotics): N/A What is the patient wearing?: Pull over shirt    Upper body assist Assist Level: Minimal Assistance - Patient > 75%    Lower Body Dressing/Undressing Lower body dressing      What is the patient wearing?: Pants, Incontinence brief     Lower body assist Assist for lower body dressing: Minimal Assistance - Patient > 75%(for brief donning)     Toileting Toileting Toileting Activity did not occur (Clothing management and hygiene only): Safety/medical concerns  Toileting assist Assist for toileting: Set up assist     Transfers Chair/bed transfer  Transfers assist     Chair/bed transfer assist level: Contact Guard/Touching assist Chair/bed transfer assistive device: Armrests, Bedrails   Locomotion Ambulation   Ambulation assist      Assist level: Moderate Assistance - Patient 50 - 74% Assistive device: Walker-rolling(ace wrap R foot) Max distance: 52   Walk 10 feet activity   Assist     Assist level: Moderate Assistance - Patient - 50 - 74% Assistive device: Walker-rolling, Other (comment)(ace wrap R foot)   Walk 50 feet activity   Assist Walk 50 feet with 2 turns activity did not occur: Safety/medical concerns  Assist level: Moderate Assistance - Patient - 50 - 74% Assistive device: Walker-rolling(ace wrap R foot)    Walk 150 feet activity   Assist Walk 150 feet activity did not occur: Safety/medical  concerns         Walk 10 feet on uneven surface  activity   Assist Walk 10 feet on uneven surfaces activity did not occur: Safety/medical concerns         Wheelchair     Assist   Type of Wheelchair: Manual    Wheelchair assist level:  Supervision/Verbal cueing Max wheelchair distance: 150    Wheelchair 50 feet with 2 turns activity    Assist        Assist Level: Supervision/Verbal cueing   Wheelchair 150 feet activity     Assist Wheelchair 150 feet activity did not occur: Safety/medical concerns   Assist Level: Supervision/Verbal cueing         Medical Problem List and Plan: 1.Right side weakness facial droop with dysarthriasecondary to bilateral punctate infarcts with largest infarct in left leftBG/CR on 05/29/2018. Status post TPA. 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  Continue CIR  AFO ordered 2. Antithrombotics: -DVT/anticoagulation:Subcutaneous Lovenox -antiplatelet therapy: Aspirin 81 mg daily, Plavix 75 mg daily x3 months then Plavix alone   CBC within acceptable range on 5/28 3. Pain Management:Tylenol as needed 4. Mood:Provide emotional support -antipsychotic agents: N/A 5. Neuropsych: This patientiscapable of making decisions on hisown behalf.  Continue tele-sitter ordered for safety 6. Skin/Wound Care:Routine skin checks 7. Fluids/Electrolytes/Nutrition:Routine in and outs  8.Hypertension with questionable medical noncompliance. HCTZ 12.5 mg daily  Lopressor 25 mg twice daily, increased to 37.5 twice daily on 5/20  Norvasc 10 mg daily.  Vitals:   06/22/18 0345 06/22/18 0832  BP: 133/78 136/76  Pulse:  80  Resp: 16   Temp: 98.3 F (36.8 C)   SpO2: 97%    Controlled on 5/31  Monitor with increased mobility 9.Hyperlipidemia. Lipitor 10.History of alcohol use.  Counseling 11.New findings type 2 diabetes mellitus.Hemoglobin A1c 6.5. SSI. Diabetic teaching  Metformin 250 daily started on 5/19, increased to 500 on 5/22, increased to twice daily on 5/28 CBG (last 3)  Recent Labs    06/21/18 1628 06/21/18 2130 06/22/18 0641  GLUCAP 144* 126* 139*   Remains elevated on 5/31, will consider  further increase in medications tomorrow if persistent 12.  Hyponatremia  Sodium 133 on 5/28  Continue to monitor 13.  Sleep disturbance  Melatonin started on 5/26, increased on 5/27  Improving 14.  Constipation  MiraLAX daily started on 5/27  Improving  LOS: 17 days A FACE TO FACE EVALUATION WAS PERFORMED  Cristin Penaflor Karis Juba 06/22/2018, 10:50 AM

## 2018-06-23 ENCOUNTER — Inpatient Hospital Stay (HOSPITAL_COMMUNITY): Payer: Medicare Other | Admitting: Occupational Therapy

## 2018-06-23 ENCOUNTER — Inpatient Hospital Stay (HOSPITAL_COMMUNITY): Payer: Medicare Other | Admitting: Physical Therapy

## 2018-06-23 ENCOUNTER — Inpatient Hospital Stay (HOSPITAL_COMMUNITY): Payer: Medicare Other | Admitting: Speech Pathology

## 2018-06-23 LAB — GLUCOSE, CAPILLARY
Glucose-Capillary: 155 mg/dL — ABNORMAL HIGH (ref 70–99)
Glucose-Capillary: 139 mg/dL — ABNORMAL HIGH (ref 70–99)
Glucose-Capillary: 125 mg/dL — ABNORMAL HIGH (ref 70–99)

## 2018-06-23 NOTE — Progress Notes (Signed)
Speech Language Pathology Daily Session Note  Patient Details  Name: JEREMMY BLAESING MRN: [id number] Date of Birth: Jan 29, 1950  Today's Date: 06/23/2018 SLP Individual Time: 1355-1450 SLP Individual Time Calculation (min): 55 min  Short Term Goals: Week 3: SLP Short Term Goal 1 (Week 3): Patient will recall 4/4 WRAP memory strategies and utilize at least x1 strategy during a functional structured or unstructured task with 50% accuracy.  SLP Short Term Goal 2 (Week 3): Patient will complete mildly complex, functional problem solving tasks with 75% accuracy and min cues in order to increase safety in the home environment.  Skilled Therapeutic Interventions: Skilled treatment session focused on cognitive goals. SLP facilitated session by re-administering the Cognistat. Patient scored WFL in all subtests with the exception of moderate impairments in calculations. Patient reported he was not surprised by that as he uses pencil and paper to complete mathematical calculations. Patient also recalled events from previous therapy sessions with overall supervision level verbal cues. Patient left upright in recliner with alarm on and all needs within reach. Continue with current plan of care.      Pain No/Denies Pain  Therapy/Group: Individual Therapy  Ayannah Faddis 06/23/2018, 2:53 PM

## 2018-06-23 NOTE — Progress Notes (Signed)
[facility] PHYSICAL MEDICINE & REHABILITATION PROGRESS NOTE  Subjective/Complaints: Patient seen laying in bed this morning.  He states he slept well overnight.  He notes improvement in strength.  ROS: Denies CP, shortness of breath, nausea, vomiting, diarrhea.  Objective: Vital Signs: Blood pressure 127/77, pulse 70, temperature (!) 97.5 F (36.4 C), temperature source Oral, resp. rate 18, height 5\' 8"  (1.727 m), weight 81.2 kg, SpO2 98 %. No results found. No results for input(s): WBC, HGB, HCT, PLT in the last 72 hours. No results for input(s): NA, K, CL, CO2, GLUCOSE, BUN, CREATININE, CALCIUM in the last 72 hours.  Physical Exam: BP 127/77 (BP Location: Left Arm)   Pulse 70   Temp (!) 97.5 F (36.4 C) (Oral)   Resp 18   Ht 5\' 8"  (1.727 m)   Wt 81.2 kg   SpO2 98%   BMI 27.23 kg/m  Constitutional: No distress . Vital signs reviewed. HENT: Normocephalic.  Atraumatic. Eyes: EOMI.  No discharge.   Cardiovascular: No JVD. Respiratory: Normal effort. GI: Non-distended. Musc: No edema or tenderness in extremities. Neurological:  Alert Makes eye contact with examiner.  Dysarthria, stable Facial droop, stable Follows commands Motor:  RUE: Shoulder abduction 4-/5, distally 4/5 with apraxia, improving Right lower extremity: Hip flexion, knee extension 4-/5, ankle dorsiflexion 4-/5 with apraxia Skin: Warm and dry.  Intact. Psych: Normal mood.  Normal behavior.  Assessment/Plan: 1. Functional deficits secondary to bilateral brain infarcts which require 3+ hours per day of interdisciplinary therapy in a comprehensive inpatient rehab setting.  Physiatrist is providing close team supervision and 24 hour management of active medical problems listed below.  Physiatrist and rehab team continue to assess barriers to discharge/monitor patient progress toward functional and medical goals  Care Tool:  Bathing    Body parts bathed by patient: Chest, Abdomen, Front perineal area,  Right upper leg, Left upper leg, Left lower leg, Face, Right arm, Right lower leg, Buttocks, Left arm   Body parts bathed by helper: Left arm     Bathing assist Assist Level: Contact Guard/Touching assist     Upper Body Dressing/Undressing Upper body dressing Upper body dressing/undressing activity did not occur (including orthotics): N/A What is the patient wearing?: Pull over shirt    Upper body assist Assist Level: Minimal Assistance - Patient > 75%    Lower Body Dressing/Undressing Lower body dressing      What is the patient wearing?: Pants, Incontinence brief     Lower body assist Assist for lower body dressing: Minimal Assistance - Patient > 75%(for brief donning)     Toileting Toileting Toileting Activity did not occur (Clothing management and hygiene only): Safety/medical concerns  Toileting assist Assist for toileting: Set up assist     Transfers Chair/bed transfer  Transfers assist     Chair/bed transfer assist level: Contact Guard/Touching assist Chair/bed transfer assistive device: Armrests, Bedrails   Locomotion Ambulation   Ambulation assist      Assist level: Moderate Assistance - Patient 50 - 74% Assistive device: Walker-rolling(ace wrap R foot) Max distance: 52   Walk 10 feet activity   Assist     Assist level: Moderate Assistance - Patient - 50 - 74% Assistive device: Walker-rolling, Other (comment)(ace wrap R foot)   Walk 50 feet activity   Assist Walk 50 feet with 2 turns activity did not occur: Safety/medical concerns  Assist level: Moderate Assistance - Patient - 50 - 74% Assistive device: Walker-rolling(ace wrap R foot)    Walk 150 feet activity  Assist Walk 150 feet activity did not occur: Safety/medical concerns         Walk 10 feet on uneven surface  activity   Assist Walk 10 feet on uneven surfaces activity did not occur: Safety/medical concerns         Wheelchair     Assist   Type of  Wheelchair: Manual    Wheelchair assist level: Supervision/Verbal cueing Max wheelchair distance: 150    Wheelchair 50 feet with 2 turns activity    Assist        Assist Level: Supervision/Verbal cueing   Wheelchair 150 feet activity     Assist Wheelchair 150 feet activity did not occur: Safety/medical concerns   Assist Level: Supervision/Verbal cueing         Medical Problem List and Plan: 1.Right side weakness facial droop with dysarthriasecondary to bilateral punctate infarcts with largest infarct in left leftBG/CR on 05/29/2018. Status post TPA. 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  Continue CIR  AFO ordered 2. Antithrombotics: -DVT/anticoagulation:Subcutaneous Lovenox -antiplatelet therapy: Aspirin 81 mg daily, Plavix 75 mg daily x3 months then Plavix alone   CBC within acceptable range on 5/28 3. Pain Management:Tylenol as needed 4. Mood:Provide emotional support -antipsychotic agents: N/A 5. Neuropsych: This patientiscapable of making decisions on hisown behalf.  Continue tele-sitter ordered for safety 6. Skin/Wound Care:Routine skin checks 7. Fluids/Electrolytes/Nutrition:Routine in and outs  8.Hypertension with questionable medical noncompliance. HCTZ 12.5 mg daily  Lopressor 25 mg twice daily, increased to 37.5 twice daily on 5/20  Norvasc 10 mg daily.  Vitals:   06/22/18 2209 06/23/18 0418  BP: 133/87 127/77  Pulse:  70  Resp: 16 18  Temp: 98.8 F (37.1 C) (!) 97.5 F (36.4 C)  SpO2:     Slightly labile, but relatively controlled on 6/1  Monitor with increased mobility 9.Hyperlipidemia. Lipitor 10.History of alcohol use.  Counseling 11.New findings type 2 diabetes mellitus.Hemoglobin A1c 6.5. SSI. Diabetic teaching  Metformin 250 daily started on 5/19, increased to 500 on 5/22, increased to twice daily on 5/28 CBG (last 3)  Recent Labs     06/22/18 1607 06/22/18 2123 06/23/18 0635  GLUCAP 142* 140* 155*   Labile on 6/1 12.  Hyponatremia  Sodium 133 on 5/28  Continue to monitor 13.  Sleep disturbance  Melatonin started on 5/26, increased on 5/27  Improving 14.  Constipation  MiraLAX daily started on 5/27  Improving  LOS: 18 days A FACE TO FACE EVALUATION WAS PERFORMED   Karis JubaAnil  06/23/2018, 9:28 AM

## 2018-06-23 NOTE — Progress Notes (Signed)
Occupational Therapy Session Note  Patient Details  Name: Joe Wood MRN: [id number] Date of Birth: 1950-11-04  Today's Date: 06/23/2018 OT Individual Time: 3220-2542 OT Individual Time Calculation (min): 60 min    Short Term Goals: Week 3:  OT Short Term Goal 1 (Week 3): STG = LTGs due to remaining LOS  Skilled Therapeutic Interventions/Progress Updates:    Treatment session with focus on functional transfers, dynamic standing balance, and forced use of RUE during self-care tasks of bathing/dressing.  Pt declined shower this session, but agreeable to bathing at sink.  Therapist providing cues throughout session to increase functional use of RUE to include turning on water, obtaining items, and increased use during bathing.  Educated on WB through RUE when in standing if not actively using RUE in task.  Pt completed bathing at overall min assist, mostly for standing balance.  Pt with occasional Lt lean in standing, therapist providing tactile cues for weight shift and use of mirror for visual feedback to promote midline standing.  Pt demonstrating good recall of hemi-technique with dressing.  Pt able to don shoes this session with assist to don Rt shoe due to AFO and pt able to fasten shoes this session with increased time.  All transfers min assist stand pivot throughout session.  Pt left upright in recliner with seat belt alarm on and all needs in reach.  Therapy Documentation Precautions:  Precautions Precautions: Fall Precaution Comments: R lean and decreased R side awareness Restrictions Weight Bearing Restrictions: No Pain:  Pt with no c/o pain   Therapy/Group: Individual Therapy  Rosalio Loud 06/23/2018, 9:38 AM

## 2018-06-23 NOTE — Progress Notes (Signed)
Physical Therapy Session Note  Patient Details  Name: Joe Wood MRN: [id number] Date of Birth: 1950/12/26  Today's Date: 06/23/2018 PT Individual Time: 1000-1115 PT Individual Time Calculation (min): 75 min   Short Term Goals: Week 3:     Skilled Therapeutic Interventions/Progress Updates:  Pt seated in recliner with belt alarm on upon arrival to room. Agreeable to PT at this time.  Min assist to stand from recliner to RW. Assist needed to secure strap on hand orthotic (pt noted to have a left one on right side of walker, will try to find correct side for patient). Gait from recliner to NuStep in hall way with min/mod assist (increased assist as pt fatigued). Cues on posture, increased right step length and weight shifting with gait. Min assist to turn 180* and back up 3 steps to NuStep. Min assist for controlled sitting to NuStep with cues to reach back with left UE. NuStep for 10 minutes on Level 6 with UE/LE's with goal >/= 40 steps per minute for strengthening and activity tolerance. Min assist to stand from NuStep and then take 2-3 pivot steps to wheelchair. Pt self propelled wheelchair with UE's for >/= 300 feet with supervision. Occasional cues to avoid objects in hallway. In rehab gym: min guard assist to stand and take 2 steps to stairs. Up/down 5 stairs x 2 reps, step to pattern with cues to advance hands on rails and for weight shifting, min guard to min assist for balance. PTA transported pt back to room via wheelchair. Min guard to stand, min assist for stand pivot to bed. Pt expressing need for bathroom. Mod assist for gait into bathroom with RW (needs assist to secure strap on orthotic). Mod assist to turn and back up to toilet with 2 balance losses needing max assist to correct. Min guard assist for balance with total assist for clothing mangament both before and after voiding in toilet. Mod assist to walk to sink with min assist for balance while washing hands. Directional cues  needed on where soap and other items were. Mod assist for gait back to recliner and for 180 degree turn to sit down. After brief rest break: min assist to stand and with UE support on RW worked on alternating forward toe taps to cones with assistance for posture and weight shifting, progressed to alternating cross toe taps with same assist and cues needed. Seated in recliner with feet down- right LE HS curls with level 3 band for 2 sets of 10 reps.   Pt left with feet up and all needs in reach. Progressing well toward goals.    Therapy Documentation Precautions:  Precautions Precautions: Fall Precaution Comments: R lean and decreased R side awareness Restrictions Weight Bearing Restrictions: No      Therapy/Group: Individual Therapy  Sallyanne Kuster, PTA 06/23/2018, 4:27 PM

## 2018-06-24 ENCOUNTER — Ambulatory Visit (HOSPITAL_COMMUNITY): Payer: Medicare Other

## 2018-06-24 ENCOUNTER — Inpatient Hospital Stay (HOSPITAL_COMMUNITY): Payer: Medicare Other

## 2018-06-24 ENCOUNTER — Encounter (HOSPITAL_COMMUNITY): Payer: Medicare Other | Admitting: Speech Pathology

## 2018-06-24 ENCOUNTER — Encounter (HOSPITAL_COMMUNITY): Payer: Medicare Other | Admitting: Occupational Therapy

## 2018-06-24 LAB — GLUCOSE, CAPILLARY
Glucose-Capillary: 116 mg/dL — ABNORMAL HIGH (ref 70–99)
Glucose-Capillary: 155 mg/dL — ABNORMAL HIGH (ref 70–99)
Glucose-Capillary: 142 mg/dL — ABNORMAL HIGH (ref 70–99)
Glucose-Capillary: 134 mg/dL — ABNORMAL HIGH (ref 70–99)
Glucose-Capillary: 144 mg/dL — ABNORMAL HIGH (ref 70–99)

## 2018-06-24 NOTE — Progress Notes (Signed)
Occupational Therapy Session Note  Patient Details  Name: Joe Wood MRN: [id number] Date of Birth: June 22, 1950  Today's Date: 06/24/2018 OT Individual Time: 6004-5997 OT Individual Time Calculation (min): 75 min    Short Term Goals: Week 3:  OT Short Term Goal 1 (Week 3): STG = LTGs due to remaining LOS  Skilled Therapeutic Interventions/Progress Updates:    Pt completed functional mobility into bathroom with RW, min A for balance support. Pt requiring more cueing and physical assist when maneuvering small space to get to bathroom- of note as well is pt was not wearing AFO in route to  Bathroom, potentially causing more need for assist, as well as hyperextension at the knee. Pt completed oral hygiene seated and then requested shower. Pt completed stand pivot transfer with use of grab bar to transfer from w/c to [facility] in shower with min A. Pt sat on BSC and bimanually set up soap and washcloths. He was able to wash all UB with (S) seated. CGA provided for standing balance support with unilateral support on grab bar when washing peri areas. Pt able to reach distally to wash feet from seated with close (S). Pt alert and oriented. Pt donned shirt with increased time. CGA for donning brief and shorts sit <> stand with support on sink. Cueing requried for hemi technique and safety awareness, as pt initially stated he wanted to put the shorts on in standing. Min A to don R shoe with AFO, pt able to don L with (S). Pt able to tie B shoes. Pt set up for breakfast and encouraged to feed self with R hand as well. Pt able to do so with minimal spillage, increased time. Pt then completed [facility] training with R UE. Focused on pincer grasp, pronation <> supination, and palm > finger translation. Pt left sitting up with all needs met, chair alarm set.   Therapy Documentation Precautions:  Precautions Precautions: Fall Precaution Comments: R lean and decreased R side awareness Restrictions Weight Bearing  Restrictions: No   Therapy/Group: Individual Therapy  Curtis Sites 06/24/2018, 7:08 AM

## 2018-06-24 NOTE — Plan of Care (Signed)
  Problem: Consults Goal: RH STROKE PATIENT EDUCATION Description See Patient Education module for education specifics  Outcome: Progressing Goal: Diabetes Guidelines if Diabetic/Glucose > 140 Description If diabetic or lab glucose is > 140 mg/dl - Initiate Diabetes/Hyperglycemia Guidelines & Document Interventions  Outcome: Progressing   Problem: RH BOWEL ELIMINATION Goal: RH STG MANAGE BOWEL WITH ASSISTANCE Description STG Manage Bowel with Min Assistance.   Outcome: Progressing Flowsheets (Taken 06/24/2018 1411) STG: Pt will manage bowels with assistance: 4-Minimum assistance Goal: RH STG MANAGE BOWEL W/MEDICATION W/ASSISTANCE Description STG Manage Bowel with Medication with Min Assistance.  Outcome: Progressing Flowsheets (Taken 06/24/2018 1411) STG: Pt will manage bowels with medication with assistance: 4-Minimal assistance   Problem: RH BLADDER ELIMINATION Goal: RH STG MANAGE BLADDER WITH ASSISTANCE Description STG Manage Bladder With Min Assistance   Outcome: Progressing Flowsheets (Taken 06/24/2018 1411) STG: Pt will manage bladder with assistance: 5-Supervision/cueing   Problem: RH SKIN INTEGRITY Goal: RH STG MAINTAIN SKIN INTEGRITY WITH ASSISTANCE Description STG Maintain Skin Integrity With Mod Assistance.  Outcome: Progressing Flowsheets (Taken 06/24/2018 1411) STG: Maintain skin integrity with assistance: 4-Minimal assistance   Problem: RH SAFETY Goal: RH STG ADHERE TO SAFETY PRECAUTIONS W/ASSISTANCE/DEVICE Description STG Adhere to Safety Precautions With Mod Assistance and appropriate assistive Device.  Outcome: Progressing Flowsheets (Taken 06/24/2018 1411) STG:Pt will adhere to safety precautions with assistance/device: 4-Minimal assistance   Problem: RH PAIN MANAGEMENT Goal: RH STG PAIN MANAGED AT OR BELOW PT'S PAIN GOAL Description <3 on a 0-10 pain scale  Outcome: Progressing   Problem: RH KNOWLEDGE DEFICIT Goal: RH STG INCREASE KNOWLEDGE OF  DIABETES Description Patient will demonstrate knowledge on diabetes medications, dietary restrictions, and blood sugar parameters to rehab staff with min assist. Patient will follow up with MD after discharge.  Outcome: Progressing Goal: RH STG INCREASE KNOWLEDGE OF HYPERTENSION Description Patient will demonstrate knowledge of HTN medications, dietary restrictions, and BP parameters with min assist from rehab staff. Patient will follow up with MD after discharge.   Outcome: Progressing Goal: RH STG INCREASE KNOWLEDGE OF STROKE PROPHYLAXIS Description Patient will demonstrate knowledge of medications used to help prevent future strokes with min assist from rehab staff and follow up with MD after discharge.  Outcome: Progressing

## 2018-06-24 NOTE — Progress Notes (Signed)
Social Work Patient ID: Joe Wood, male   DOB: May 25, 1950, 68 y.o.   MRN: [id number]  Met with pt and wife who is here to learn his care. It went well and she feels comfortable with his care needs. She plans to show their son since he could not be here. Will order equipment and set up home health follow up. Prepare for DC Friday.

## 2018-06-24 NOTE — Progress Notes (Signed)
Social Work Patient ID: Joe Wood, male   DOB: 02-06-1950, 68 y.o.   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  A walker will not resolve issue with performing activities of daily living.  A wheelchair will allow patient to safely perform daily activities.  Patient is not able to propel themselves in the home using a standard weight wheelchair due to endurance and fatigue .  Patient can self propel in the lightweight wheelchair.

## 2018-06-24 NOTE — Discharge Instructions (Signed)
Inpatient Rehab Discharge Instructions  Joe Wood Discharge date and time: No discharge date for patient encounter.   Activities/Precautions/ Functional Status: Activity: As tolerated Diet: Diabetic diet Wound Care: Wound care Functional status:  ___ No restrictions     ___ Walk up steps independently ___ 24/7 supervision/assistance   ___ Walk up steps with assistance ___ Intermittent supervision/assistance  ___ Bathe/dress independently ___ Walk with walker     _x__ Bathe/dress with assistance ___ Walk Independently    ___ Shower independently ___ Walk with assistance    ___ Shower with assistance ___ No alcohol     ___ Return to work/school ________  Special Instructions:  Aspirin and Plavix x3 months then Plavix alone No driving smoking or alcohol    COMMUNITY REFERRALS UPON DISCHARGE:    Home Health:   PT, OT, SP, AIDE  Agency:[facility]   Phone:[phone]   Date of last service:06/27/2018  Medical Equipment/Items Ordered:WHEELCHAIR, ROLLING WALKER, 3 N 1, TUB BENCH  Agency/Supplier:[facility]  [phone]   GENERAL COMMUNITY RESOURCES FOR PATIENT/FAMILY: Support Groups:CVA SUPPORT GROUP THE SECOND Thursday OF EACH MONTH ( SEPT-MAY ) @ 6:00-7:00 PM ON THE REHAB UNIT QUESTIONS CALL AMY [phone]  STROKE/TIA DISCHARGE INSTRUCTIONS SMOKING Cigarette smoking nearly doubles your risk of having a stroke & is the single most alterable risk factor  If you smoke or have smoked in the last 12 months, you are advised to quit smoking for your health.  Most of the excess cardiovascular risk related to smoking disappears within a year of stopping.  Ask you doctor about anti-smoking medications  [place] Quit Line: 1-800-QUIT NOW  Free Smoking Cessation Classes [phone]  CHOLESTEROL Know your levels; limit fat & cholesterol in your diet  Lipid Panel     Component Value Date/Time   CHOL 233 (H) 05/30/2018 0250   TRIG 259 (H) 05/30/2018 0250   TRIG 251 (H)  05/30/2018 0250   HDL 62 05/30/2018 0250   CHOLHDL 3.8 05/30/2018 0250   VLDL 52 (H) 05/30/2018 0250   LDLCALC 119 (H) 05/30/2018 0250      Many patients benefit from treatment even if their cholesterol is at goal.  Goal: Total Cholesterol (CHOL) less than 160  Goal:  Triglycerides (TRIG) less than 150  Goal:  HDL greater than 40  Goal:  LDL (LDLCALC) less than 100   BLOOD PRESSURE American Stroke Association blood pressure target is less that 120/80 mm/Hg  Your discharge blood pressure is:  BP: (!) 170/102  Monitor your blood pressure  Limit your salt and alcohol intake  Many individuals will require more than one medication for high blood pressure  DIABETES (A1c is a blood sugar average for last 3 months) Goal HGBA1c is under 7% (HBGA1c is blood sugar average for last 3 months)  Diabetes:     Lab Results  Component Value Date   HGBA1C 6.5 (H) 05/30/2018     Your HGBA1c can be lowered with medications, healthy diet, and exercise.  Check your blood sugar as directed by your physician  Call your physician if you experience unexplained or low blood sugars.  PHYSICAL ACTIVITY/REHABILITATION Goal is 30 minutes at least 4 days per week  Activity:  Therapies:  Return to work:   Activity decreases your risk of heart attack and stroke and makes your heart stronger.  It helps control your weight and blood pressure; helps you relax and can improve your mood.  Participate in a regular exercise program.  Talk with your doctor  about the best form of exercise for you (dancing, walking, swimming, cycling).  DIET/WEIGHT Goal is to maintain a healthy weight  Your discharge diet is:  Diet Order            Diet Carb Modified Fluid consistency: Thin; Room service appropriate? Yes  Diet effective now              liquids Your height is:  Height: 5\' 8"  (172.7 cm) Your current weight is: Weight: 80.8 kg Your Body Mass Index (BMI) is:  BMI (Calculated): 27.09  Following the type  of diet specifically designed for you will help prevent another stroke.  Your goal weight range is:    Your goal Body Mass Index (BMI) is 19-24.  Healthy food habits can help reduce 3 risk factors for stroke:  High cholesterol, hypertension, and excess weight.  RESOURCES Stroke/Support Group:  Call [phone]   STROKE EDUCATION PROVIDED/REVIEWED AND GIVEN TO PATIENT Stroke warning signs and symptoms How to activate emergency medical system (call 911). Medications prescribed at discharge. Need for follow-up after discharge. Personal risk factors for stroke. Pneumonia vaccine given:  Flu vaccine given:  My questions have been answered, the writing is legible, and I understand these instructions.  I will adhere to these goals & educational materials that have been provided to me after my discharge from the hospital.     My questions have been answered and I understand these instructions. I will adhere to these goals and the provided educational materials after my discharge from the hospital.  Patient/Caregiver Signature _______________________________ Date __________  Clinician Signature _______________________________________ Date __________  Please bring this form and your medication list with you to all your follow-up doctor's appointments.

## 2018-06-24 NOTE — Progress Notes (Signed)
Speech Language Pathology Daily Session Note  Patient Details  Name: Joe Wood MRN: [id number] Date of Birth: 1950/06/09  Today's Date: 06/24/2018 SLP Individual Time: 1130-1200 SLP Individual Time Calculation (min): 30 min  Short Term Goals: Week 3: SLP Short Term Goal 1 (Week 3): Patient will recall 4/4 WRAP memory strategies and utilize at least x1 strategy during a functional structured or unstructured task with 50% accuracy.  SLP Short Term Goal 2 (Week 3): Patient will complete mildly complex, functional problem solving tasks with 75% accuracy and min cues in order to increase safety in the home environment.  Skilled Therapeutic Interventions: Skilled treatment session focused on completion of patient and family education with the patient's wife. Both were educated in regards to patient's current cognitive functioning and strategies to utilize at home to maximize memory and overall safety at home, especially the importance of 24 hour supervision. She verbalized understanding of all information and handouts were also given to reinforce information. Patient transferred back to the recliner at the end of session and left with alarm on an all needs within reach. Continue with current plan of care.      Pain Pain Assessment Pain Scale: 0-10 Pain Score: 0-No pain  Therapy/Group: Individual Therapy  Fortune Brannigan 06/24/2018, 12:44 PM

## 2018-06-24 NOTE — Progress Notes (Signed)
Occupational Therapy Session Note  Patient Details  Name: Joe Wood MRN: [id number] Date of Birth: 1950/06/05  Today's Date: 06/24/2018 OT Individual Time: 1000-1045 OT Individual Time Calculation (min): 45 min    Short Term Goals: Week 3:  OT Short Term Goal 1 (Week 3): STG = LTGs due to remaining LOS  Skilled Therapeutic Interventions/Progress Updates:    Treatment session with focus on hands on family education in regards to ADLs and bathroom transfers.  Pt received upright in w/c with wife present upon arrival.  Discussed goals of therapy and current progress towards goals.  Educated on stand pivot and ambulatory transfers in bathroom as pt's wife is unsure if w/c will fit in the bathroom.  Pt's wife completed hands on with tub transfer and toilet transfer with use of RW for ambulatory and stand pivot without RW from w/c.  Min cues for hand placement for wife and educated on safety and body mechanics to allow for pt and caregiver safety.  Discussed use of gait belt for transfers, especially if doing short distance gait to access bathroom.  Educated on DME with both accepting of recommendation for tub bench and 3 in 1.  Educated on dynamic standing balance during bathing and toileting hygiene, with pt's wife able to demonstrate good hand placement to provide stability during standing.  Pt and wife passed off to PT to continue family education.  Therapy Documentation Precautions:  Precautions Precautions: Fall Precaution Comments: R lean and decreased R side awareness Restrictions Weight Bearing Restrictions: No General:   Vital Signs: Therapy Vitals Pulse Rate: 79 Resp: 16 BP: 133/78 Patient Position (if appropriate): Sitting Oxygen Therapy SpO2: 100 % O2 Device: Room Air Pain: Pain Assessment Pain Score: 0-No pain   Therapy/Group: Individual Therapy  Rosalio Loud 06/24/2018, 3:20 PM

## 2018-06-24 NOTE — Progress Notes (Signed)
Physical Therapy Weekly Progress Note  Patient Details  Name: BENJAMYN HESTAND MRN: [id number] Date of Birth: 1950/02/05  Beginning of progress report period: Jun 13, 2018 End of progress report period: June 24, 2018  Today's Date: 06/24/2018 PT Individual Time: 1045-1130 PT Individual Time Calculation (min): 45 min   Patient has met 2 of 2 short term goals.  Patient has progressed with transfers and ambulation this week and now able to walk short distances with CGA to min A with R AFO with RW and R hand splint.    Patient continues to demonstrate the following deficits decreased coordination and decreased motor planning, decreased attention to right and decreased attention, decreased safety awareness and decreased memory and therefore will continue to benefit from skilled PT intervention to increase functional independence with mobility.  Patient progressing toward long term goals..  Continue plan of care.  PT Short Term Goals Week 2:  PT Short Term Goal 1 (Week 2): pt to perform gait x 25' with min A in controlled environment PT Short Term Goal 1 - Progress (Week 2): Met PT Short Term Goal 2 (Week 2): pt consistently min A with functional transfers PT Short Term Goal 2 - Progress (Week 2): Met Week 3:  PT Short Term Goal 1 (Week 3): STG=LTG due to ELOS  Skilled Therapeutic Interventions/Progress Updates:    Patient seen with spouse present for caregiver education.  Met them in the gym with OT present.  Discussed with pt and spouse stair set up for home entry.  Demonstrated with pt sit to stand and stair negotiation with reverse technique with walker due to no rails on stairs at home.  Patient min to mod A for balance and walker management with cues to negotiate 3 (4") steps with RW and reverse technique.  Spouse assisted pt needing mod cues for also holding walker and handout given with reminders of technique.  She asked if able to go up in w/c and demonstrated technique for bumping up in w/c  and she preferred to have pt use walker.  Patient propelled in w/c to mat in hallway outside his room with S with hemi technique.  Educated wife along the way regarding w/c level mobility in the home.  Patient performed simulated car transfer to mat with bolster on floor to lift leg over with min A and increased time.  Spouse performed with pt and return demonstrated safely.  Spouse educated to call if further questions arise and discussed pt will have HEP upon d/c.    Therapy Documentation Precautions:  Precautions Precautions: Fall Precaution Comments: R lean and decreased R side awareness Restrictions Weight Bearing Restrictions: No Pain: Pain Assessment Pain Scale: 0-10 Pain Score: 0-No pain   Therapy/Group: Individual Therapy  Reginia Naas  [place], PT 06/24/2018, 12:02 PM

## 2018-06-24 NOTE — Progress Notes (Signed)
[place] PHYSICAL MEDICINE & REHABILITATION PROGRESS NOTE  Subjective/Complaints: Patient seen sitting up in a chair working with therapies this morning.  He states he slept well overnight.  ROS: Denies CP, shortness of breath, nausea, vomiting, diarrhea.  Objective: Vital Signs: Blood pressure (!) 174/93, pulse 68, temperature 98.7 F (37.1 C), resp. rate 17, height 5\' 8"  (1.727 m), weight 81.2 kg, SpO2 99 %. No results found. No results for input(s): WBC, HGB, HCT, PLT in the last 72 hours. No results for input(s): NA, K, CL, CO2, GLUCOSE, BUN, CREATININE, CALCIUM in the last 72 hours.  Physical Exam: BP (!) 174/93 (BP Location: Right Arm)   Pulse 68   Temp 98.7 F (37.1 C)   Resp 17   Ht 5\' 8"  (1.727 m)   Wt 81.2 kg   SpO2 99%   BMI 27.23 kg/m  Constitutional: No distress . Vital signs reviewed. HENT: Normocephalic.  Atraumatic. Eyes: EOMI.  No discharge.   Cardiovascular: No JVD. Respiratory: Normal effort. GI: Non-distended. Musc: No edema or tenderness in extremities. Neurological:  Alert Makes eye contact with examiner.  Dysarthria, improved Follows commands Motor:  RUE: Shoulder abduction 4-/5, distally 4/5 with apraxia, stable Right lower extremity: Hip flexion, knee extension 4-/5, ankle dorsiflexion 3+-4-/5 with apraxia Skin: Warm and dry.  Intact. Psych: Normal mood.  Normal behavior.  Assessment/Plan: 1. Functional deficits secondary to bilateral brain infarcts which require 3+ hours per day of interdisciplinary therapy in a comprehensive inpatient rehab setting.  Physiatrist is providing close team supervision and 24 hour management of active medical problems listed below.  Physiatrist and rehab team continue to assess barriers to discharge/monitor patient progress toward functional and medical goals  Care Tool:  Bathing    Body parts bathed by patient: Chest, Abdomen, Front perineal area, Right upper leg, Left upper leg, Left lower leg, Face,  Right arm, Right lower leg, Buttocks, Left arm   Body parts bathed by helper: Left arm     Bathing assist Assist Level: Contact Guard/Touching assist     Upper Body Dressing/Undressing Upper body dressing Upper body dressing/undressing activity did not occur (including orthotics): N/A What is the patient wearing?: Pull over shirt    Upper body assist Assist Level: Supervision/Verbal cueing    Lower Body Dressing/Undressing Lower body dressing      What is the patient wearing?: Pants, Incontinence brief     Lower body assist Assist for lower body dressing: Contact Guard/Touching assist     Toileting Toileting Toileting Activity did not occur (Clothing management and hygiene only): Safety/medical concerns  Toileting assist Assist for toileting: Set up assist     Transfers Chair/bed transfer  Transfers assist     Chair/bed transfer assist level: Minimal Assistance - Patient > 75% Chair/bed transfer assistive device: Bedrails, Walker   Locomotion Ambulation   Ambulation assist      Assist level: Moderate Assistance - Patient 50 - 74% Assistive device: Walker-rolling(ace wrap R foot) Max distance: 52   Walk 10 feet activity   Assist     Assist level: Moderate Assistance - Patient - 50 - 74% Assistive device: Walker-rolling, Other (comment)(ace wrap R foot)   Walk 50 feet activity   Assist Walk 50 feet with 2 turns activity did not occur: Safety/medical concerns  Assist level: Moderate Assistance - Patient - 50 - 74% Assistive device: Walker-rolling(ace wrap R foot)    Walk 150 feet activity   Assist Walk 150 feet activity did not occur: Safety/medical concerns  Walk 10 feet on uneven surface  activity   Assist Walk 10 feet on uneven surfaces activity did not occur: Safety/medical concerns         Wheelchair     Assist   Type of Wheelchair: Manual    Wheelchair assist level: Supervision/Verbal cueing Max wheelchair  distance: 150    Wheelchair 50 feet with 2 turns activity    Assist        Assist Level: Supervision/Verbal cueing   Wheelchair 150 feet activity     Assist Wheelchair 150 feet activity did not occur: Safety/medical concerns   Assist Level: Supervision/Verbal cueing         Medical Problem List and Plan: 1.Right side weakness facial droop with dysarthriasecondary to bilateral punctate infarcts with largest infarct in left leftBG/CR on 05/29/2018. Status post TPA. 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  Continue CIR  AFO ordered 2. Antithrombotics: -DVT/anticoagulation:Subcutaneous Lovenox -antiplatelet therapy: Aspirin 81 mg daily, Plavix 75 mg daily x3 months then Plavix alone   CBC within acceptable range on 5/28 3. Pain Management:Tylenol as needed 4. Mood:Provide emotional support -antipsychotic agents: N/A 5. Neuropsych: This patientiscapable of making decisions on hisown behalf.  Continue tele-sitter ordered for safety 6. Skin/Wound Care:Routine skin checks 7. Fluids/Electrolytes/Nutrition:Routine in and outs  8.Hypertension with questionable medical noncompliance. HCTZ 12.5 mg daily  Lopressor 25 mg twice daily, increased to 37.5 twice daily on 5/20  Norvasc 10 mg daily.  Vitals:   06/23/18 2004 06/24/18 0527  BP: 137/84 (!) 174/93  Pulse: 80 68  Resp: 18 17  Temp: 98.5 F (36.9 C) 98.7 F (37.1 C)  SpO2: 99% 99%   Labile and elevated on 6/2, monitor for trend  Monitor with increased mobility 9.Hyperlipidemia. Lipitor 10.History of alcohol use.  Counseling 11.New findings type 2 diabetes mellitus.Hemoglobin A1c 6.5. SSI. Diabetic teaching  Metformin 250 daily started on 5/19, increased to 500 on 5/22, increased to twice daily on 5/28 CBG (last 3)  Recent Labs    06/23/18 1641 06/23/18 2142 06/24/18 0600  GLUCAP 125* 116* 155*   Labile on 6/2, monitor for  trend 12.  Hyponatremia  Sodium 133 on 5/28  Continue to monitor 13.  Sleep disturbance  Melatonin started on 5/26, increased on 5/27  Improved 14.  Constipation  MiraLAX daily started on 5/27  Improved  LOS: 19 days A FACE TO FACE EVALUATION WAS PERFORMED  Shaqueena Mauceri Karis Juba 06/24/2018, 10:12 AM

## 2018-06-25 ENCOUNTER — Inpatient Hospital Stay (HOSPITAL_COMMUNITY): Payer: Medicare Other | Admitting: Physical Therapy

## 2018-06-25 ENCOUNTER — Inpatient Hospital Stay (HOSPITAL_COMMUNITY): Payer: Medicare Other | Admitting: Occupational Therapy

## 2018-06-25 ENCOUNTER — Inpatient Hospital Stay (HOSPITAL_COMMUNITY): Payer: Medicare Other | Admitting: Speech Pathology

## 2018-06-25 LAB — GLUCOSE, CAPILLARY
Glucose-Capillary: 135 mg/dL — ABNORMAL HIGH (ref 70–99)
Glucose-Capillary: 112 mg/dL — ABNORMAL HIGH (ref 70–99)
Glucose-Capillary: 141 mg/dL — ABNORMAL HIGH (ref 70–99)
Glucose-Capillary: 111 mg/dL — ABNORMAL HIGH (ref 70–99)

## 2018-06-25 NOTE — Progress Notes (Signed)
Speech Language Pathology Daily Session Note  Patient Details  Name: Joe Wood MRN: [id number] Date of Birth: [birth year]  Today's Date: 06/25/2018 SLP Individual Time: 0930-1030 SLP Individual Time Calculation (min): 60 min  Short Term Goals: Week 3: SLP Short Term Goal 1 (Week 3): Patient will recall 4/4 WRAP memory strategies and utilize at least x1 strategy during a functional structured or unstructured task with 50% accuracy.  SLP Short Term Goal 2 (Week 3): Patient will complete mildly complex, functional problem solving tasks with 75% accuracy and min cues in order to increase safety in the home environment.  Skilled Therapeutic Interventions:    Skilled treatment session focused on cognition goals. SLP facilitated session by providing Min A cues for selective attention in moderately distracting environment (pt seen for therapy in table in common area). SLP further facilitiated session by providing Mod A cues to recall WRAP memory strategies and Mod A cues to utilize 1 strategy during structured task. SLP further facilitated session by providing written ways to implement strategies within structured tasks. Despite brainstorming ways to implement strategy, pt struggled with task and didn't refer to written strategies. Despite Mod A cues, pt unable to provide appropriate solution to safety situation within home environment. Pt returned to room, transferred to recliner, left upright with lap belt alarm on and all needs within reach as well as telesitter present. Continue pt current plan of care.   Pain    Therapy/Group: Individual Therapy  Hal Norrington 06/25/2018, 11:23 AM

## 2018-06-25 NOTE — Progress Notes (Signed)
Social Work Patient ID: Joe Wood, male   DOB: Jan 17, 1951, 68 y.o.   MRN: [id number] Met with pt and spoke with wife via telephone to inform team conference progress in therapies and discharge 6/5. He is ready to go home and wife has been through training and feels comfortable with this. Prepare for discharge Friday.

## 2018-06-25 NOTE — Progress Notes (Signed)
Occupational Therapy Session Note  Patient Details  Name: Joe Wood MRN: [id number] Date of Birth: 23-Dec-1950  Today's Date: 06/25/2018 OT Individual Time: 5945-8592 and 1330-1400 OT Individual Time Calculation (min): 60 min and 30 min   Short Term Goals: Week 3:  OT Short Term Goal 1 (Week 3): STG = LTGs due to remaining LOS  Skilled Therapeutic Interventions/Progress Updates:    1) Treatment session with focus on dynamic standing balance and RUE NMR. Pt received supine in bed declining bathing but agreeable to donning pants and shoes, pt with no c/o pain. Pt completed bed mobility with min assist and completed LB dressing at sit > stand level with CGA for standing balance.  Pt able to don Rt shoe with increased time and assistance to position due to AFO.  Engaged in dynamic standing balance incorporating reaching with RUE.  CGA for sit > stand and intermittent cues and tactile cues for weight shift as pt with tendency to lean to Lt.  Incorporated reaching for resistive clothespins on Rt to facilitate weight shift and to incorporate fine and gross motor challenge.  Pt demonstrating difficulty with increased shoulder flexion challenge.  Engaged in water pouring task in sitting with focus on motor control when managing cup of water.  Pt passed off to SLP.  2) Treatment session with focus on RUE NMR.  Completed stand pivot transfer recliner > w/c with Min assist.  Engaged in RUE NMR In sitting with focus on fine motor control while completing push pin pattern to incorporate in hand manipulation and functional pinch.  Pt required mod cues initially for sequencing and technique faded to supervision.  Returned to bed at end of session, CGA stand pivot transfer and min assist to advance RLE in to bed.  Pt left semi-reclined with all needs in reach.  Therapy Documentation Precautions:  Precautions Precautions: Fall Precaution Comments: R lean and decreased R side awareness Restrictions Weight  Bearing Restrictions: No General:   Vital Signs: Therapy Vitals Pulse Rate: 85 Resp: 20 BP: 137/78 Patient Position (if appropriate): Lying Oxygen Therapy SpO2: 96 % O2 Device: Room Air Pain:  Pt with no c/o pain   Therapy/Group: Individual Therapy  Rosalio Loud 06/25/2018, 3:17 PM

## 2018-06-25 NOTE — Plan of Care (Signed)
  Problem: Consults Goal: RH STROKE PATIENT EDUCATION Description See Patient Education module for education specifics  Outcome: Progressing Goal: Diabetes Guidelines if Diabetic/Glucose > 140 Description If diabetic or lab glucose is > 140 mg/dl - Initiate Diabetes/Hyperglycemia Guidelines & Document Interventions  Outcome: Progressing   Problem: RH BOWEL ELIMINATION Goal: RH STG MANAGE BOWEL WITH ASSISTANCE Description STG Manage Bowel with Min Assistance.   Outcome: Progressing Flowsheets (Taken 06/25/2018 1515) STG: Pt will manage bowels with assistance: 4-Minimum assistance Goal: RH STG MANAGE BOWEL W/MEDICATION W/ASSISTANCE Description STG Manage Bowel with Medication with Min Assistance.  Outcome: Progressing Flowsheets (Taken 06/25/2018 1515) STG: Pt will manage bowels with medication with assistance: 4-Minimal assistance   Problem: RH BLADDER ELIMINATION Goal: RH STG MANAGE BLADDER WITH ASSISTANCE Description STG Manage Bladder With Min Assistance   Outcome: Progressing Flowsheets (Taken 06/25/2018 1515) STG: Pt will manage bladder with assistance: 5-Supervision/set up   Problem: RH SKIN INTEGRITY Goal: RH STG MAINTAIN SKIN INTEGRITY WITH ASSISTANCE Description STG Maintain Skin Integrity With Mod Assistance.  Outcome: Progressing Flowsheets (Taken 06/25/2018 1515) STG: Maintain skin integrity with assistance: 5-Supervision/cueing   Problem: RH SAFETY Goal: RH STG ADHERE TO SAFETY PRECAUTIONS W/ASSISTANCE/DEVICE Description STG Adhere to Safety Precautions With Mod Assistance and appropriate assistive Device.  Outcome: Progressing Flowsheets (Taken 06/25/2018 1515) STG:Pt will adhere to safety precautions with assistance/device: 4-Minimal assistance   Problem: RH PAIN MANAGEMENT Goal: RH STG PAIN MANAGED AT OR BELOW PT'S PAIN GOAL Description <3 on a 0-10 pain scale  Outcome: Progressing   Problem: RH KNOWLEDGE DEFICIT Goal: RH STG INCREASE KNOWLEDGE OF  DIABETES Description Patient will demonstrate knowledge on diabetes medications, dietary restrictions, and blood sugar parameters to rehab staff with min assist. Patient will follow up with MD after discharge.  Outcome: Progressing Goal: RH STG INCREASE KNOWLEDGE OF HYPERTENSION Description Patient will demonstrate knowledge of HTN medications, dietary restrictions, and BP parameters with min assist from rehab staff. Patient will follow up with MD after discharge.   Outcome: Progressing Goal: RH STG INCREASE KNOWLEDGE OF STROKE PROPHYLAXIS Description Patient will demonstrate knowledge of medications used to help prevent future strokes with min assist from rehab staff and follow up with MD after discharge.  Outcome: Progressing

## 2018-06-25 NOTE — Progress Notes (Signed)
[place] PHYSICAL MEDICINE & REHABILITATION PROGRESS NOTE  Subjective/Complaints: Patient seen laying in bed this morning.  He states he slept well overnight.  He notes improvement in strength.  Discussed ulcer with nursing.  ROS: Denies CP, shortness of breath, nausea, vomiting, diarrhea.  Objective: Vital Signs: Blood pressure 137/78, pulse 85, temperature 98.3 F (36.8 C), temperature source Oral, resp. rate 20, height 5\' 8"  (1.727 m), weight 81.2 kg, SpO2 96 %. No results found. No results for input(s): WBC, HGB, HCT, PLT in the last 72 hours. No results for input(s): NA, K, CL, CO2, GLUCOSE, BUN, CREATININE, CALCIUM in the last 72 hours.  Physical Exam: BP 137/78 (BP Location: Left Arm)   Pulse 85   Temp 98.3 F (36.8 C) (Oral)   Resp 20   Ht 5\' 8"  (1.727 m)   Wt 81.2 kg   SpO2 96%   BMI 27.23 kg/m  Constitutional: No distress . Vital signs reviewed. HENT: Normocephalic.  Atraumatic. Eyes: EOMI.  No discharge.   Cardiovascular: No JVD. Respiratory: Normal effort. GI: Non-distended. Musc: No edema or tenderness in extremities. Neurological:  Alert Makes eye contact with examiner.  Dysarthria, improved Follows commands Motor:  RUE: Shoulder abduction 4-/5, distally 4/5 with apraxia, unchanged Right lower extremity: Hip flexion, knee extension 4-/5, ankle dorsiflexion 3+-4-/5 with apraxia, unchanged Skin: Warm and dry.  Intact. Psych: Normal mood.  Normal behavior.  Assessment/Plan: 1. Functional deficits secondary to bilateral brain infarcts which require 3+ hours per day of interdisciplinary therapy in a comprehensive inpatient rehab setting.  Physiatrist is providing close team supervision and 24 hour management of active medical problems listed below.  Physiatrist and rehab team continue to assess barriers to discharge/monitor patient progress toward functional and medical goals  Care Tool:  Bathing    Body parts bathed by patient: Chest, Abdomen,  Front perineal area, Right upper leg, Left upper leg, Left lower leg, Face, Right arm, Right lower leg, Buttocks, Left arm   Body parts bathed by helper: Left arm     Bathing assist Assist Level: Contact Guard/Touching assist     Upper Body Dressing/Undressing Upper body dressing Upper body dressing/undressing activity did not occur (including orthotics): N/A What is the patient wearing?: Pull over shirt    Upper body assist Assist Level: Supervision/Verbal cueing    Lower Body Dressing/Undressing Lower body dressing      What is the patient wearing?: Pants, Incontinence brief     Lower body assist Assist for lower body dressing: Contact Guard/Touching assist     Toileting Toileting Toileting Activity did not occur (Clothing management and hygiene only): Safety/medical concerns  Toileting assist Assist for toileting: Set up assist     Transfers Chair/bed transfer  Transfers assist     Chair/bed transfer assist level: Minimal Assistance - Patient > 75% Chair/bed transfer assistive device: Walker, Orthosis   Locomotion Ambulation   Ambulation assist      Assist level: Minimal Assistance - Patient > 75% Assistive device: Walker-rolling(R AFO) Max distance: 12'   Walk 10 feet activity   Assist     Assist level: Minimal Assistance - Patient > 75% Assistive device: Walker-rolling, Orthosis   Walk 50 feet activity   Assist Walk 50 feet with 2 turns activity did not occur: Safety/medical concerns  Assist level: Moderate Assistance - Patient - 50 - 74% Assistive device: Walker-rolling(ace wrap R foot)    Walk 150 feet activity   Assist Walk 150 feet activity did not occur: Safety/medical concerns  Walk 10 feet on uneven surface  activity   Assist Walk 10 feet on uneven surfaces activity did not occur: Safety/medical concerns         Wheelchair     Assist Will patient use wheelchair at discharge?: Yes Type of Wheelchair:  Manual    Wheelchair assist level: Supervision/Verbal cueing Max wheelchair distance: 150    Wheelchair 50 feet with 2 turns activity    Assist        Assist Level: Supervision/Verbal cueing   Wheelchair 150 feet activity     Assist Wheelchair 150 feet activity did not occur: Safety/medical concerns   Assist Level: Supervision/Verbal cueing         Medical Problem List and Plan: 1.Right side weakness facial droop with dysarthriasecondary to bilateral punctate infarcts with largest infarct in left leftBG/CR on 05/29/2018. Status post TPA. 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  Continue CIR  Team conference today to discuss current and goals and coordination of care, home and environmental barriers, and discharge planning with nursing, case manager, and therapies.   AFO ordered 2. Antithrombotics: -DVT/anticoagulation:Subcutaneous Lovenox -antiplatelet therapy: Aspirin 81 mg daily, Plavix 75 mg daily x3 months then Plavix alone   CBC within acceptable range on 5/28 3. Pain Management:Tylenol as needed 4. Mood:Provide emotional support -antipsychotic agents: N/A 5. Neuropsych: This patientiscapable of making decisions on hisown behalf.  Continue tele-sitter ordered for safety 6. Skin/Wound Care:Routine skin checks 7. Fluids/Electrolytes/Nutrition:Routine in and outs  8.Hypertension with questionable medical noncompliance. HCTZ 12.5 mg daily  Lopressor 25 mg twice daily, increased to 37.5 twice daily on 5/20  Norvasc 10 mg daily.  Vitals:   06/25/18 0526 06/25/18 1421  BP: (!) 155/90 137/78  Pulse: 67 85  Resp: 18 20  Temp: 98.3 F (36.8 C)   SpO2: 100% 96%   Labile on 6/3  Monitor with increased mobility 9.Hyperlipidemia. Lipitor 10.History of alcohol use.  Counseling 11.New findings type 2 diabetes mellitus.Hemoglobin A1c 6.5. SSI. Diabetic teaching  Metformin 250  daily started on 5/19, increased to 500 on 5/22, increased to twice daily on 5/28 CBG (last 3)  Recent Labs    06/24/18 2139 06/25/18 0654 06/25/18 1237  GLUCAP 144* 135* 112*   Labile on 6/3 12.  Hyponatremia  Sodium 133 on 5/28  Continue to monitor 13.  Sleep disturbance  Melatonin started on 5/26, increased on 5/27  Improved 14.  Constipation  MiraLAX daily started on 5/27  Improved  LOS: 20 days A FACE TO FACE EVALUATION WAS PERFORMED  Noble Bodie Karis Juba 06/25/2018, 2:25 PM

## 2018-06-25 NOTE — Patient Care Conference (Signed)
Inpatient RehabilitationTeam Conference and Plan of Care Update Date: 06/25/2018   Time: 2:00 PM    Patient Name: Joe Wood      Medical Record Number: [id number]  Date of Birth: December 30, 1950 Sex: Male         Room/Bed: 5N14C/5N14C-01 Payor Info: Payor: [employer] / Plan: [employer] MEDICARE / Product Type: *No Product type* /    Admitting Diagnosis: Gen Team  Lt CVA; 23-24days  Admit Date/Time:  06/05/2018  3:55 PM Admission Comments: No comment available   Primary Diagnosis:  <principal problem not specified> Principal Problem: <principal problem not specified>  Patient Active Problem List   Diagnosis Date Noted  . Slow transit constipation   . Sleep disturbance   . Labile blood pressure   . Labile blood glucose   . Cerebellar stroke, acute (HCC)   . Acute cerebral infarction (HCC)   . Hyponatremia   . Diabetes mellitus, new onset (HCC)   . Essential hypertension   . History of alcohol abuse   . Basal ganglia stroke (HCC) 06/05/2018  . 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 Hypertensive emergency 06/04/2018  . Hyperlipidemia LDL goal <70 06/04/2018  . Diabetes mellitus, type II (HCC) 06/04/2018  . Alcohol abuse 06/04/2018  . Acute ischemic stroke [facility]) - s/p tPA, d/t large vessel disease 05/29/2018    Expected Discharge Date: Expected Discharge Date: 06/27/18  Team Members Present: Physician leading conference: Dr. Maryla Morrow Social Worker Present: Dossie Der, LCSW Nurse Present: Allayne Stack, RN PT Present: Judieth Keens, PT OT Present: Rosalio Loud, OT SLP Present: Reuel Derby, SLP PPS Coordinator present : Edson Snowball, PT     Current Status/Progress Goal Weekly Team Focus  Medical   Right side weakness facial droop with dysarthria secondary to bilateral punctate infarcts with largest infarct in left leftBG/CR on 05/29/2018.  Status post TPA.  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  Improve  mobility, DM/BP, electrolytes  See above   Bowel/Bladder   Pt is continent/incontinent of B/B. LBM 06/24/2018.  Maintain regular bowel pattern. Encourage timed toileting.  Assist with toileting needs PRN.   Swallow/Nutrition/ Hydration             ADL's   min assist bathing, LB dressing, and toilet transfers; supervision UB dressing  min A dynamic stand balance, LB self care, toilet and tub shower transfer; S with cognition and UB self care  ADL retraining, transfers, dynamic standing balance, RUE NMR and awareness   Mobility   S w/c mobility, CGA/min A transfers, gait with AFO and RW x 50' min A to mod A, Stairs reverse with RW min to mod A  min A gait, supervision w/c level  stairs reverse technique for home entry, R side awareness, balance, gait   Communication             Safety/Cognition/ Behavioral Observations  Min A   Min A   Family Educatin complete, d/c planning    Pain   No complaints of pain.  Remain pain free.  Assess pain Q shift and PRN.   Skin   No skin issues.  Maintain skin integrity and prevent skin breakdown.  Assess skin Q shift and PRN.      *See Care Plan and progress notes for long and short-term goals.     Barriers to Discharge  Current Status/Progress Possible Resolutions Date Resolved   Physician    Medical stability     See  above  Therapies, optimize DM/HTN meds, follow labs, optimize sleep      Nursing                  PT                    OT                  SLP                SW                Discharge Planning/Teaching Needs:  Wife here yesterday for family training and it went well. She is aware he will need 24 hr physcial care and is prepared to provide this.      Team Discussion:  Family education yesterday and it went well with his wife, she will show son what his care entails. Stable with BP meds and sleeping better according to MD. Pt at min assist level and supervision with wheelchair mobility. Incontinent at times, needs timed  toileting-wife aware of this.  Revisions to Treatment Plan:  DC 6/5    Continued Need for Acute Rehabilitation Level of Care: The patient requires daily medical management by a physician with specialized training in physical medicine and rehabilitation for the following conditions: Daily direction of a multidisciplinary physical rehabilitation program to ensure safe treatment while eliciting the highest outcome that is of practical value to the patient.: Yes Daily medical management of patient stability for increased activity during participation in an intensive rehabilitation regime.: Yes Daily analysis of laboratory values and/or radiology reports with any subsequent need for medication adjustment of medical intervention for : Neurological problems;Diabetes problems;Blood pressure problems;Other   I attest that I was present, lead the team conference, and concur with the assessment and plan of the team. Teleconference held due to COVID 19   Avyn Aden, Lemar LivingsRebecca G 06/25/2018, 3:50 PM

## 2018-06-25 NOTE — Progress Notes (Signed)
Physical Therapy Session Note  Patient Details  Name: Joe Wood MRN: [id number] Date of Birth: [birth year]  Today's Date: 06/25/2018 PT Individual Time: 1130-1224 PT Individual Time Calculation (min): 54 min   Short Term Goals: Week 3:  PT Short Term Goal 1 (Week 3): STG=LTG due to ELOS  Skilled Therapeutic Interventions/Progress Updates:    session focused on gait and stair training. Pt performs gait with RW and Rt AFO with CGA 50' x 3 during session.  Transfer training blocked practice without AD with CGA.  Pt performs stair negotiation using reverse technique with RW with mod A, 3 stairs x 2 attempts. Pt states "there has to be a better way".  PT had pt perform stairs with 1 railing and min A, HHA and encouraged pt to attempt to have a railing installed at home.  Pt left in recliner chair with wife made aware of PT recommendations by phone, pt with alarm set, needs at hand.  Therapy Documentation Precautions:  Precautions Precautions: Fall Precaution Comments: R lean and decreased R side awareness Restrictions Weight Bearing Restrictions: No Pain:  no c/o pain   Therapy/Group: Individual Therapy  Roger Fasnacht 06/25/2018, 12:50 PM

## 2018-06-26 ENCOUNTER — Inpatient Hospital Stay (HOSPITAL_COMMUNITY): Payer: Medicare Other | Admitting: Physical Therapy

## 2018-06-26 ENCOUNTER — Inpatient Hospital Stay (HOSPITAL_COMMUNITY): Payer: Medicare Other | Admitting: Speech Pathology

## 2018-06-26 ENCOUNTER — Inpatient Hospital Stay (HOSPITAL_COMMUNITY): Payer: Medicare Other | Admitting: Occupational Therapy

## 2018-06-26 ENCOUNTER — Inpatient Hospital Stay (HOSPITAL_COMMUNITY): Payer: Medicare Other

## 2018-06-26 LAB — GLUCOSE, CAPILLARY
Glucose-Capillary: 139 mg/dL — ABNORMAL HIGH (ref 70–99)
Glucose-Capillary: 143 mg/dL — ABNORMAL HIGH (ref 70–99)
Glucose-Capillary: 126 mg/dL — ABNORMAL HIGH (ref 70–99)
Glucose-Capillary: 120 mg/dL — ABNORMAL HIGH (ref 70–99)

## 2018-06-26 LAB — CREATININE, SERUM
Creatinine, Ser: 1.03 mg/dL (ref 0.61–1.24)
GFR calc Af Amer: >60 mL/min (ref >60)
GFR calc non Af Amer: >60 mL/min (ref >60)

## 2018-06-26 NOTE — Progress Notes (Signed)
Speech Language Pathology Discharge Summary  Patient Details  Name: Joe Wood MRN: [id number] Date of Birth: 10/25/1950  Today's Date: 06/26/2018 SLP Individual Time: 0830-0925 SLP Individual Time Calculation (min): 55 min   Skilled Therapeutic Interventions:  Skilled treatment session focused on cognitive goals. Upon arrival, patient was awake in bed and requested to transfer to the recliner. SLP facilitated session by providing Min A verbal cues for problem solving while donning pants and with transfer to maximize safety. SLP also facilitated session by providing Min-Mod A verbal cues for anticipatory awareness in regards to generating a list of activities the patient can perform safely at home to stay both physically and cognitively active. Patient will discharge home tomorrow with family. Patient left upright in the recliner with alarm on and all needs within reach. Continue with current plan of care.   Patient has met 3 of 3 long term goals.  Patient to discharge at overall Supervision;Min level.   Reasons goals not met: N/A   Clinical Impression/Discharge Summary: Patient has made excellent gains and has met 3 of 3 LTGs this admission. Currently, patient is overall Mod I for use of speech intelligibility strategies to achieve 100% intelligibility at the conversation level. Patient also requires supervision-Min A verbal cues to complete functional and familiar tasks safely in regards to problem solving and requires Mod A verbal and visual cues for recall of functional information with use of compensatory strategies. Patient and family education is complete and patient will discharge home with 24 hour supervision from family. Patient would benefit from f/u SLP services to maximize his cognitive functioning and overall functional independence in order to reduce caregiver burden.   Care Partner:  Caregiver Able to Provide Assistance: Yes  Type of Caregiver Assistance:  Physical;Cognitive  Recommendation:  24 hour supervision/assistance;Home Health SLP  Rationale for SLP Follow Up: Maximize cognitive function and independence;Reduce caregiver burden   Equipment: N/A   Reasons for discharge: Treatment goals met;Discharged from hospital   Patient/Family Agrees with Progress Made and Goals Achieved: Yes    [name], [place] 06/26/2018, 6:38 AM

## 2018-06-26 NOTE — Progress Notes (Signed)
Physical Therapy Session Note  Patient Details  Name: Joe Wood MRN: [id number] Date of Birth: [birth year]  Today's Date: 06/26/2018 PT Individual Time: 8333-8329 PT Individual Time Calculation (min): 54 min   Short Term Goals: Week 3:  PT Short Term Goal 1 (Week 3): STG=LTG due to ELOS  Skilled Therapeutic Interventions/Progress Updates:    Pt received sitting in recliner and agreeable to therapy session; however, states he cannot find his urinal which is located on his R but with min cuing able to locate. Sit>stand recliner>RW with CGA for steadying. Stand pivot transfer recliner>EOB using RW with min assist/CGA for balance. Supine<>sit, HOB flat and without bedrails, with supervision for safety and increased time for R LE management. Ambulated ~45ft using RW, R hand orthosis, and R LE AFO with min assist for balance and cuing throughout for increased R LE foot clearance during swing phase of gait.  Performed the following exercises in preparation for HEP: - supine bridging with R LE preference 2 x 12 repetitions - cuing for proper technique - sidelying R LE clamshells with pt demonstration compensations requiring constant tactile cuing and manual facilitation to perform with proper technique therefore did not include in HEP printout - repeated sit<>stand EOM<>RW 2 x 10 repetitions with multimodal cuing/manual facilitatoin for increased R LE weightshift and weight bearing and CGA/min assist for balance -  Standing R LE repeated hip flexion x 10 repetitions with BUE support on RW and CGA for steadying throughout - cuing for technique - Standing R LE knee flexion with B UE support on RW x10 repetitions - manual facilitation for technique Pt educated on need for family to provide assistance for balance during exercises and this was written in HEP instructions - printed HEP provided to patient. Ambulated ~76ft using RW, R hand orthosis, and R LE AFO with CGA for balance and continues to require  cuing for increased R LE foot clearance during swing phase. Stand>sit RW>EOB with CGA for steadying. Sit>supine with supervision. Pt requesting to doff shoes and TED hose - performed with max assist for time management. Pt left supine in bed with needs in reach, telesitter in place, and bed alarm on.    Therapy Documentation Precautions:  Precautions Precautions: Fall Precaution Comments: R lean and decreased R side awareness Restrictions Weight Bearing Restrictions: No  Pain: No reports of pain during session.  Therapy/Group: Individual Therapy  Ginny Forth, PT, DPT 06/26/2018, 1:14 PM

## 2018-06-26 NOTE — Progress Notes (Signed)
Physical Therapy Discharge Summary  Patient Details  Name: Joe Wood MRN: [id number] Date of Birth: [birth year]  Today's Date: 06/26/2018 PT Individual Time: 0933-1030 PT Individual Time Calculation (min): 57 min    Patient in recliner and agreeable to PT.  Seated to don shoes and R AFO with min A.  Performed sit to stand throughout session with S to CGA for safety and increased time for R foot placement.  Patient ambulated 67' with min A and RW with R hand splint and R AFO.  Propelled w/c x 150' with S with hemi technique.  Negotiated 3 steps with RW and min A cues for sequence.  Patient assisted to 4MW unit for car transfer and performed with min A stand step with RW.  Patient negotiated ramp with RW and min A.  Assisted back to unit and to room and stand step to recliner.  Assist for positioning for comfort and chair alarm set and call bell in reach.   Patient has met 10 of 12 long term goals due to improved activity tolerance, improved balance, increased strength, ability to compensate for deficits, functional use of  right lower extremity and improved attention.  Patient to discharge at a wheelchair level [place].   Patient's care partner is independent to provide the necessary physical assistance at discharge.  Reasons goals not met: Patient still needing min CGA for functional transfers due to R inattention and occasional inappropriate foot position.  Recommendation:  Patient will benefit from ongoing skilled PT services in home health setting to continue to advance safe functional mobility, address ongoing impairments in balance, R side awareness, safety, R side weakness and cognitive deficits, and minimize fall risk.  Equipment: wheelchair, RW, R AFO  Reasons for discharge: treatment goals met and discharge from hospital  Patient/family agrees with progress made and goals achieved: Yes  PT Discharge Precautions/Restrictions Precautions Precautions: Fall Precaution  Comments: decreased R side awareness Pain Pain Assessment Pain Scale: 0-10 Pain Score: 0-No pain Vision/Perception  Perception Perception: Impaired Inattention/Neglect: Does not attend to right side of body;Does not attend to right visual field Praxis Praxis: Impaired Praxis Impairment Details: Motor planning  Cognition Overall Cognitive Status: Impaired/Different from baseline Arousal/Alertness: Awake/alert Orientation Level: Oriented X4 Attention: Sustained Memory: Impaired Awareness: Impaired Safety/Judgment: Impaired Sensation Sensation Light Touch: Appears Intact Coordination Coordination and Movement Description: Decresaed coordination of R UE/LE sequencing/timing, placement Motor  Motor Motor: Hemiplegia;Abnormal postural alignment and control;Motor apraxia;Ataxia Motor - Discharge Observations: R side weakness, decreased coordination R, decreased motor planning  Mobility Transfers Transfers: Sit to Stand;Stand to Sit Sit to Stand: Contact Guard/Touching assist Stand to Sit: Supervision/Verbal cueing Transfer (Assistive device): Rolling walker Locomotion  Gait Ambulation: Yes Gait Assistance: Minimal Assistance - Patient > 75% Gait Distance (Feet): 70 Feet Assistive device: Rolling walker;Other (Comment)(R AFO) Gait Gait Pattern: Decreased stance time - right;Right flexed knee in stance;Lateral hip instability Stairs / Additional Locomotion Stairs: Yes Stairs Assistance: Minimal Assistance - Patient > 75% Stair Management Technique: Backwards;Step to pattern;With walker Number of Stairs: 3 Height of Stairs: 4 Ramp: Minimal Assistance - Patient >75% Wheelchair Mobility Wheelchair Mobility: Yes Wheelchair Assistance: Supervision/Verbal cueing Wheelchair Propulsion: Left upper extremity;Left lower extremity Wheelchair Parts Management: Supervision/cueing Distance: 150  Trunk/Postural Assessment  Cervical Assessment Cervical Assessment: Exceptions to  WFL(forward head) Thoracic Assessment Thoracic Assessment: Exceptions to WFL(R side elongation) Lumbar Assessment Lumbar Assessment: Exceptions to WFL(post pelvic tilt) Postural Control Postural Control: Deficits on evaluation Righting Reactions: slowed  Balance Dynamic Sitting Balance Dynamic Sitting -  Level of Assistance: 5: Stand by assistance Sitting balance - Comments: donning shoes Dynamic Standing Balance Dynamic Standing - Balance Support: Bilateral upper extremity supported Dynamic Standing - Level of Assistance: 4: Min assist Extremity Assessment      RLE Assessment RLE Assessment: Exceptions to [facility] Passive Range of Motion (PROM) Comments: WFL Active Range of Motion (AROM) Comments: limited ankle AROM, hip and knee [facility] General Strength Comments: hip flexion 3-/5, knee extension 3+/5, ankle DF 1/5 LLE Assessment LLE Assessment: Within Functional Limits    Reginia Naas  Magda Kiel, PT 06/26/2018, 1:24 PM

## 2018-06-26 NOTE — Progress Notes (Signed)
[place] PHYSICAL MEDICINE & REHABILITATION PROGRESS NOTE  Subjective/Complaints: Patient seen lying in bed this morning.  He states he slept well overnight.  He states he is looking forward to being discharged tomorrow.  ROS: Denies CP, shortness of breath, nausea, vomiting, diarrhea.  Objective: Vital Signs: Blood pressure 117/72, pulse 75, temperature 98.6 F (37 C), temperature source Oral, resp. rate 18, height 5\' 8"  (1.727 m), weight 81.2 kg, SpO2 99 %. No results found. No results for input(s): WBC, HGB, HCT, PLT in the last 72 hours. Recent Labs    06/26/18 0018  CREATININE 1.03    Physical Exam: BP 117/72 (BP Location: Left Arm)   Pulse 75   Temp 98.6 F (37 C) (Oral)   Resp 18   Ht 5\' 8"  (1.727 m)   Wt 81.2 kg   SpO2 99%   BMI 27.23 kg/m  Constitutional: No distress . Vital signs reviewed. HENT: Normocephalic.  Atraumatic. Eyes: EOMI.  No discharge.   Cardiovascular: No JVD. Respiratory: Normal effort. GI: Non-distended. Musc: No edema or tenderness in extremities. Neurological:  Alert Makes eye contact with examiner.  Dysarthria, improved Follows commands Motor:  RUE: Shoulder abduction 4-/5, distally 4/5 with apraxia, improving Right lower extremity: Hip flexion, knee extension 4-/5, ankle dorsiflexion 3+-4-/5 with apraxia, improving Skin: Warm and dry.  Intact. Psych: Normal mood.  Normal behavior.  Assessment/Plan: 1. Functional deficits secondary to bilateral brain infarcts which require 3+ hours per day of interdisciplinary therapy in a comprehensive inpatient rehab setting.  Physiatrist is providing close team supervision and 24 hour management of active medical problems listed below.  Physiatrist and rehab team continue to assess barriers to discharge/monitor patient progress toward functional and medical goals  Care Tool:  Bathing    Body parts bathed by patient: Chest, Abdomen, Front perineal area, Right upper leg, Left upper leg,  Left lower leg, Face, Right arm, Right lower leg, Buttocks, Left arm   Body parts bathed by helper: Left arm     Bathing assist Assist Level: Contact Guard/Touching assist     Upper Body Dressing/Undressing Upper body dressing Upper body dressing/undressing activity did not occur (including orthotics): N/A What is the patient wearing?: Pull over shirt    Upper body assist Assist Level: Supervision/Verbal cueing    Lower Body Dressing/Undressing Lower body dressing      What is the patient wearing?: Pants     Lower body assist Assist for lower body dressing: Contact Guard/Touching assist     Toileting Toileting Toileting Activity did not occur (Clothing management and hygiene only): Safety/medical concerns  Toileting assist Assist for toileting: Set up assist     Transfers Chair/bed transfer  Transfers assist     Chair/bed transfer assist level: Minimal Assistance - Patient > 75% Chair/bed transfer assistive device: Walker, Orthosis   Locomotion Ambulation   Ambulation assist      Assist level: Minimal Assistance - Patient > 75% Assistive device: Walker-rolling(R AFO) Max distance: 12'   Walk 10 feet activity   Assist     Assist level: Minimal Assistance - Patient > 75% Assistive device: Walker-rolling, Orthosis   Walk 50 feet activity   Assist Walk 50 feet with 2 turns activity did not occur: Safety/medical concerns  Assist level: Moderate Assistance - Patient - 50 - 74% Assistive device: Walker-rolling(ace wrap R foot)    Walk 150 feet activity   Assist Walk 150 feet activity did not occur: Safety/medical concerns         Walk  10 feet on uneven surface  activity   Assist Walk 10 feet on uneven surfaces activity did not occur: Safety/medical concerns         Wheelchair     Assist Will patient use wheelchair at discharge?: Yes Type of Wheelchair: Manual    Wheelchair assist level: Supervision/Verbal cueing Max wheelchair  distance: 150    Wheelchair 50 feet with 2 turns activity    Assist        Assist Level: Supervision/Verbal cueing   Wheelchair 150 feet activity     Assist Wheelchair 150 feet activity did not occur: Safety/medical concerns   Assist Level: Supervision/Verbal cueing         Medical Problem List and Plan: 1.Right side weakness facial droop with dysarthriasecondary to bilateral punctate infarcts with largest infarct in left leftBG/CR on 05/29/2018. Status post TPA. 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  Continue CIR  Plan for d/c tomorrow  Will see patient for transitional care management in 1-2 weeks post-discharge  AFO ordered 2. Antithrombotics: -DVT/anticoagulation:Subcutaneous Lovenox -antiplatelet therapy: Aspirin 81 mg daily, Plavix 75 mg daily x3 months then Plavix alone   CBC within acceptable range on 5/28 3. Pain Management:Tylenol as needed 4. Mood:Provide emotional support -antipsychotic agents: N/A 5. Neuropsych: This patientiscapable of making decisions on hisown behalf.  Continue tele-sitter ordered for safety 6. Skin/Wound Care:Routine skin checks 7. Fluids/Electrolytes/Nutrition:Routine in and outs   Creatinine within normal limits on 6/4 8.Hypertension with questionable medical noncompliance. HCTZ 12.5 mg daily  Lopressor 25 mg twice daily, increased to 37.5 twice daily on 5/20  Norvasc 10 mg daily.  Vitals:   06/25/18 1953 06/26/18 0410  BP: 114/71 117/72  Pulse: 96 75  Resp: 16 18  Temp: 98.6 F (37 C) 98.6 F (37 C)  SpO2: 96% 99%   Controlled on 6/4  Monitor with increased mobility 9.Hyperlipidemia. Lipitor 10.History of alcohol use.  Counseling 11.New findings type 2 diabetes mellitus.Hemoglobin A1c 6.5. SSI. Diabetic teaching  Metformin 250 daily started on 5/19, increased to 500 on 5/22, increased to twice daily on 5/28 CBG (last 3)  Recent  Labs    06/25/18 2108 06/26/18 0611 06/26/18 1225  GLUCAP 111* 139* 143*   Slightly elevated on 6/4 12.  Hyponatremia  Sodium 133 on 5/28  Continue to monitor 13.  Sleep disturbance  Melatonin started on 5/26, increased on 5/27  Improved 14.  Constipation  MiraLAX daily started on 5/27  Improved  LOS: 21 days A FACE TO FACE EVALUATION WAS PERFORMED  Joe Wood Joe Wood 06/26/2018, 12:57 PM

## 2018-06-26 NOTE — Discharge Summary (Addendum)
Physician Discharge Summary  Patient ID: Joe Wood MRN: [id number] DOB/AGE: 1950-03-12 68 y.o.  Admit date: 06/05/2018 Discharge date: 06/27/2018  Discharge Diagnoses:  Active Problems:   Basal ganglia stroke (HCC)   Cerebellar stroke, acute (HCC)   Acute cerebral infarction (HCC)   Hyponatremia   Diabetes mellitus, new onset (HCC)   Essential hypertension   History of alcohol abuse   Labile blood glucose   Labile blood pressure   Sleep disturbance   Slow transit constipation   Discharged Condition: Stable  Significant Diagnostic Studies: Ct Angio Head W Or Wo Contrast  Result Date: 05/29/2018 CLINICAL DATA:  Right-sided weakness EXAM: CT ANGIOGRAPHY HEAD AND NECK TECHNIQUE: Multidetector CT imaging of the head and neck was performed using the standard protocol during bolus administration of intravenous contrast. Multiplanar CT image reconstructions and MIPs were obtained to evaluate the vascular anatomy. Carotid stenosis measurements (when applicable) are obtained utilizing NASCET criteria, using the distal internal carotid diameter as the denominator. CONTRAST:  75mL OMNIPAQUE IOHEXOL 350 MG/ML SOLN COMPARISON:  Head CT earlier today FINDINGS: CTA NECK FINDINGS Aortic arch: Atheromatous plaque.  No acute finding Right carotid system: Moderate calcified plaque at the bifurcation without flow limiting stenosis or ulceration. Left carotid system: Calcified plaque at the bifurcation without flow limiting stenosis or ulceration. Vertebral arteries: No proximal subclavian stenosis. No flow is seen throughout the left vertebral artery. The right vertebral artery is smooth and widely patent to the dura. Skeleton: No acute or aggressive finding Other neck: No acute finding Upper chest: Negative Review of the MIP images confirms the above findings CTA HEAD FINDINGS Anterior circulation: Due to motion requiring repeat imaging there is suboptimal bolus density. Atherosclerotic plaque on the  carotid siphons. No proximal flow limiting stenosis or major branch occlusion is seen. Probable medium vessel irregularity from atherosclerosis, but limited by bolus quality. Posterior circulation: No flow is seen within the left vertebral artery until the intradural segment where faint flow is seen. Small basilar in the setting of bilateral fetal type PCA. There is a mid basilar severe stenosis with apparent flow gap. Advanced narrowing of the right PCA at the P2 segment. Atheromatous irregularity of the left P2/3 segment. Venous sinuses: Patent when accounting for arachnoid granulations. Anatomic variants: As above Delayed phase: Not obtained Review of the MIP images confirms the above findings IMPRESSION: 1. No emergent large vessel occlusion. 2. Small basilar in the setting of bilateral fetal type PCA with severe stenosis of the mid basilar giving an apparent flow gap. No flow is seen within the left vertebral artery until the distal V4 segment. 3. Cervical and intracranial carotid atherosclerosis without flow limiting stenosis. Electronically Signed   By: Marnee Spring M.D.   On: 05/29/2018 06:51   Ct Head Wo Contrast  Result Date: 06/02/2018 CLINICAL DATA:  New unexplained altered mental status. Recent cerebral angiography. EXAM: CT HEAD WITHOUT CONTRAST TECHNIQUE: Contiguous axial images were obtained from the base of the skull through the vertex without intravenous contrast. COMPARISON:  Catheter angio, MRI and CT studies demonstrating severe basilar stenosis with superimposed hypoplasia. BILATERAL acute cerebral infarcts. FINDINGS: Brain: Hypoattenuation involving the LEFT lenticulostriate territory redemonstrated, appears roughly similar to prior MR. Chronic lacune LEFT thalamus. Smaller lacunar infarct RIGHT basal ganglia also appears stable. No acute hemorrhage, mass lesion, or extra-axial fluid. No hydrocephalus. Hypoattenuation of white matter consistent with small vessel disease. Vascular:  Calcification of the cavernous internal carotid arteries consistent with cerebrovascular atherosclerotic disease. No signs of intracranial large vessel  occlusion. Skull: No acute findings. Sinuses/Orbits: No significant sinus or mastoid disease. Negative orbits. Other: None. IMPRESSION: Chronic changes as described. Similar appearance to priors. No acute intracranial findings. Electronically Signed   By: Elsie Stain M.D.   On: 06/02/2018 13:06   Ct Angio Neck W Or Wo Contrast  Result Date: 05/29/2018 CLINICAL DATA:  Right-sided weakness EXAM: CT ANGIOGRAPHY HEAD AND NECK TECHNIQUE: Multidetector CT imaging of the head and neck was performed using the standard protocol during bolus administration of intravenous contrast. Multiplanar CT image reconstructions and MIPs were obtained to evaluate the vascular anatomy. Carotid stenosis measurements (when applicable) are obtained utilizing NASCET criteria, using the distal internal carotid diameter as the denominator. CONTRAST:  75mL OMNIPAQUE IOHEXOL 350 MG/ML SOLN COMPARISON:  Head CT earlier today FINDINGS: CTA NECK FINDINGS Aortic arch: Atheromatous plaque.  No acute finding Right carotid system: Moderate calcified plaque at the bifurcation without flow limiting stenosis or ulceration. Left carotid system: Calcified plaque at the bifurcation without flow limiting stenosis or ulceration. Vertebral arteries: No proximal subclavian stenosis. No flow is seen throughout the left vertebral artery. The right vertebral artery is smooth and widely patent to the dura. Skeleton: No acute or aggressive finding Other neck: No acute finding Upper chest: Negative Review of the MIP images confirms the above findings CTA HEAD FINDINGS Anterior circulation: Due to motion requiring repeat imaging there is suboptimal bolus density. Atherosclerotic plaque on the carotid siphons. No proximal flow limiting stenosis or major branch occlusion is seen. Probable medium vessel irregularity  from atherosclerosis, but limited by bolus quality. Posterior circulation: No flow is seen within the left vertebral artery until the intradural segment where faint flow is seen. Small basilar in the setting of bilateral fetal type PCA. There is a mid basilar severe stenosis with apparent flow gap. Advanced narrowing of the right PCA at the P2 segment. Atheromatous irregularity of the left P2/3 segment. Venous sinuses: Patent when accounting for arachnoid granulations. Anatomic variants: As above Delayed phase: Not obtained Review of the MIP images confirms the above findings IMPRESSION: 1. No emergent large vessel occlusion. 2. Small basilar in the setting of bilateral fetal type PCA with severe stenosis of the mid basilar giving an apparent flow gap. No flow is seen within the left vertebral artery until the distal V4 segment. 3. Cervical and intracranial carotid atherosclerosis without flow limiting stenosis. Electronically Signed   By: Marnee Spring M.D.   On: 05/29/2018 06:51   [name] Head Wo Contrast  Result Date: 05/30/2018 CLINICAL DATA:  Stroke follow-up EXAM: MRI HEAD WITHOUT CONTRAST MRA HEAD WITHOUT CONTRAST TECHNIQUE: Multiplanar, multiecho pulse sequences of the brain and surrounding structures were obtained without intravenous contrast. Angiographic images of the head were obtained using MRA technique without contrast. COMPARISON:  CTA head neck from yesterday FINDINGS: MRI HEAD FINDINGS Brain: Wedge of restricted diffusion in the left corona radiata. Small acute infarct at the right putamen. Background of chronic small vessel ischemia with gliosis in the cerebral white matter and lacune at the frontal horn of the left lateral ventricle. Small remote right pontine and right cerebellar infarcts. Remote left thalamic infarct. No acute hemorrhage, hydrocephalus, or masslike finding. Asymmetric prominent CSF in the left parietal region but no clear vessel displacement to imply hygroma. Vascular:  Arterial findings below. Skull and upper cervical spine: Negative for marrow lesion Sinuses/Orbits: Negative MRA HEAD FINDINGS Dominant right vertebral artery. Minimal flow present in the left vertebral artery. Small basilar in the setting of fetal type  bilateral PCA. There is a superimposed severe basilar stenosis with apparent flow gap. Bilateral PCA atherosclerosis with moderate P2/3 narrowings. Atherosclerotic irregularity of anterior circulation medium size vessels. No branch occlusion or aneurysm. IMPRESSION: 1. Acute perforator infarct in the left corona radiata. 2. 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 3. 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 4. Intracranial MRA is stable from CTA yesterday. 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 Electronically Signed   By: Marnee Spring M.D.   On: 05/30/2018 06:06   Mr Brain Wo Contrast  Result Date: 06/02/2018 CLINICAL DATA:  Increased dysarthria, right hemiparesis, and involuntary movements of the left hand, face, and lower extremities following cerebral angiography today which revealed a severe basilar artery stenosis. EXAM: MRI HEAD WITHOUT CONTRAST TECHNIQUE: Multiplanar, multiecho pulse sequences of the brain and surrounding structures were obtained without intravenous contrast. COMPARISON:  Head CT 06/02/2018 and MRI 05/30/2018 FINDINGS: A limited brain MRI was requested by the ordering neurologist. Axial and coronal diffusion, axial SWI, axial T2* gradient echo, axial FLAIR, and axial T2 imaging was performed. The study is variably motion degraded with the SWI sequence being severely degraded. Brain: A 2.5 cm acute left lateral lenticulostriate territory infarct involving the posterior corona radiata and basal ganglia and a punctate acute infarct in the right putamen/external capsule are unchanged from the prior MRI. There are 2 new punctate acute infarcts, 1 in the parietal lobe and 1 in the  inferior right cerebellar hemisphere. No intracranial hemorrhage, intracranial mass effect, or extra-axial fluid collection is identified. There is mild cerebral atrophy. Small chronic infarcts are again noted in the left thalamus, left frontal periventricular white matter, right pons, and right cerebellum. Patchy T2 hyperintensities elsewhere in the cerebral white matter bilaterally are unchanged and compatible with moderate chronic small vessel ischemic disease. Vascular: Major intracranial vascular flow voids are preserved. Skull and upper cervical spine: No suspicious marrow lesion. Sinuses/Orbits: Unremarkable orbits. Paranasal sinuses and mastoid air cells are clear. Other: None. IMPRESSION: 1. Punctate acute right parietal and right cerebellar infarcts. 2. Unchanged acute left larger than right basal ganglia region infarcts. 3. Moderate chronic small vessel ischemic disease. Electronically Signed   By: Sebastian Ache M.D.   On: 06/02/2018 16:46   Mr Brain Wo Contrast  Result Date: 05/30/2018 CLINICAL DATA:  Stroke follow-up EXAM: MRI HEAD WITHOUT CONTRAST MRA HEAD WITHOUT CONTRAST TECHNIQUE: Multiplanar, multiecho pulse sequences of the brain and surrounding structures were obtained without intravenous contrast. Angiographic images of the head were obtained using MRA technique without contrast. COMPARISON:  CTA head neck from yesterday FINDINGS: MRI HEAD FINDINGS Brain: Wedge of restricted diffusion in the left corona radiata. Small acute infarct at the right putamen. Background of chronic small vessel ischemia with gliosis in the cerebral white matter and lacune at the frontal horn of the left lateral ventricle. Small remote right pontine and right cerebellar infarcts. Remote left thalamic infarct. No acute hemorrhage, hydrocephalus, or masslike finding. Asymmetric prominent CSF in the left parietal region but no clear vessel displacement to imply hygroma. Vascular: Arterial findings below. Skull and upper  cervical spine: Negative for marrow lesion Sinuses/Orbits: Negative MRA HEAD FINDINGS Dominant right vertebral artery. Minimal flow present in the left vertebral artery. Small basilar in the setting of fetal type bilateral PCA. There is a superimposed severe basilar stenosis with apparent flow gap. Bilateral PCA atherosclerosis with moderate P2/3 narrowings. Atherosclerotic irregularity of anterior circulation medium size vessels. No branch occlusion or  aneurysm. IMPRESSION: 1. Acute perforator infarct in the left corona radiata. 2. 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 3. 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 4. Intracranial MRA is stable from CTA yesterday. 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 Electronically Signed   By: Marnee Spring M.D.   On: 05/30/2018 06:06   Ir Angiogram Extremity Left  Result Date: 06/05/2018 CLINICAL DATA:  Dysarthria with right-sided weakness. Abnormal MRI of the brain with bihemispheric DWI changes. Attenuated flow signal in the mid basilar artery on MRA examination. EXAM: BILATERAL COMMON CAROTID AND INNOMINATE ANGIOGRAPHY; LEFT EXTREMITY ARTERIOGRAPHY; IR ANGIO VERTEBRAL SEL SUBCLAVIAN INNOMINATE UNI RIGHT MOD SED COMPARISON:  MRI MRA of the brain of 05/30/2018. MEDICATIONS: Heparin 1000 units IV; No antibiotic was administered within 1 hour of the procedure. ANESTHESIA/SEDATION: Versed 1 mg IV; Fentanyl 25 mcg IV.  Hydralazine 5 mg IV 1 time. Moderate Sedation Time:  28 minutes. The patient was continuously monitored during the procedure by the interventional radiology nurse under my direct supervision. CONTRAST:  Isovue 300 approximately 45 mL. FLUOROSCOPY TIME:  Fluoroscopy Time: 7 minutes 43 seconds (764 mGy). COMPLICATIONS: None immediate. TECHNIQUE: Informed written consent was obtained from the patient after a thorough discussion of the procedural risks, benefits and alternatives. All questions were  addressed. Maximal Sterile Barrier Technique was utilized including caps, mask, sterile gowns, sterile gloves, sterile drape, hand hygiene and skin antiseptic. A timeout was performed prior to the initiation of the procedure. The right groin was prepped and draped in the usual sterile fashion. Thereafter using modified Seldinger technique, transfemoral access into the right common femoral artery was obtained without difficulty. Over a 0.035 inch guidewire, a 5 French Pinnacle sheath was inserted. Through this, and also over 0.035 inch guidewire, a 5 [place] JB 1 catheter was advanced to the aortic arch region and selectively positioned in the right common carotid artery, the right subclavian artery, the left common carotid artery and the left subclavian artery. FINDINGS: The right subclavian arteriogram demonstrates widely patent right vertebral artery. The vessel is seen to opacify to the cranial skull base. Patency is seen of the right vertebrobasilar junction and the right posterior-inferior cerebellar artery. 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 The right common carotid arteriogram demonstrates the right external carotid artery and its major branches to be widely patent. The right internal carotid artery at the bulb has a smooth shallow plaque along the posterior wall without evidence of ulcerations. No evidence of intraluminal filling defects is seen either. More distally, the right internal carotid artery is seen to opacify to the cranial skull base. The petrous, cavernous and the supraclinoid segments are widely patent. The right posterior communicating artery is seen opacifying the right posterior cerebral artery, and transiently bilateral superior cerebellar arteries. The right middle cerebral artery and the right anterior cerebral artery opacify into the capillary and venous phases. A soft plaque is seen in the right middle cerebral artery  proximally. The left common carotid arteriogram demonstrates the left external carotid artery and its major branches to be widely patent. The left internal carotid artery at the bulb has a smooth shallow plaque along the medial aspect with approximately 10% stenosis by the NASCET criteria. Mild tortuosity is seen of the proximal 1/3 of the left internal carotid artery. More distally, the vessel assumes normal caliber to the cranial skull base. The petrous, the cavernous and the supraclinoid  segments are widely patent. The left posterior communicating artery is seen opacifying the left posterior cerebral artery distribution. The left middle cerebral artery at its origin and proximally demonstrates a 50% stenosis secondary to a circumferential soft atherosclerotic plaque. The left MCA branches, and the left anterior cerebral artery opacify into the capillary and venous phases. The right subclavian arteriogram demonstrates non visualization of the left vertebral artery. Ascending cervical branch of the thyrocervical trunk is noted to be present. 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 PLAN: Endovascular treatment of mid basilar artery pre occlusive stenosis in approximately 10 days. Electronically Signed   By: Julieanne Cotton M.D.   On: 06/02/2018 13:44   Ct Head Code Stroke Wo Contrast  Result Date: 05/29/2018 CLINICAL DATA:  Code stroke. Right-sided deficits. Elevated blood pressure EXAM: CT HEAD WITHOUT CONTRAST TECHNIQUE: Contiguous axial images were obtained from the base of the skull through the vertex without intravenous contrast. COMPARISON:  None. FINDINGS: Brain: Lacunar infarct in the left thalamus, age-indeterminate based on density. Small remote right cerebellar infarct. Chronic small vessel  ischemic type change in the cerebral white matter. Symmetric atrophy. No hydrocephalus or masslike finding. Vascular: No hyperdense vessel. Skull: Negative Sinuses/Orbits: Negative Other: Motion degraded. These results were communicated to Dr. Wilford Corner at [facility] amon 5/7/2020by text page via the [facility] messaging system. ASPECTS ([place] Stroke Program Early CT Score) - Ganglionic level infarction (caudate, lentiform nuclei, internal capsule, insula, M1-M3 cortex): 7 - Supraganglionic infarction (M4-M6 cortex): 3 Total score (0-10 with 10 being normal): 10 IMPRESSION: 1. Age-indeterminate lacunar infarct in the left thalamus. 2. Negative for acute hemorrhage. 3. Motion degraded. Electronically Signed   By: Marnee Spring M.D.   On: 05/29/2018 06:15   Ir Angio Intra Extracran Sel Com Carotid Innominate Bilat Mod Sed  Result Date: 06/05/2018 CLINICAL DATA:  Dysarthria with right-sided weakness. Abnormal MRI of the brain with bihemispheric DWI changes. Attenuated flow signal in the mid basilar artery on MRA examination. EXAM: BILATERAL COMMON CAROTID AND INNOMINATE ANGIOGRAPHY; LEFT EXTREMITY ARTERIOGRAPHY; IR ANGIO VERTEBRAL SEL SUBCLAVIAN INNOMINATE UNI RIGHT MOD SED COMPARISON:  MRI MRA of the brain of 05/30/2018. MEDICATIONS: Heparin 1000 units IV; No antibiotic was administered within 1 hour of the procedure. ANESTHESIA/SEDATION: Versed 1 mg IV; Fentanyl 25 mcg IV.  Hydralazine 5 mg IV 1 time. Moderate Sedation Time:  28 minutes. The patient was continuously monitored during the procedure by the interventional radiology nurse under my direct supervision. CONTRAST:  Isovue 300 approximately 45 mL. FLUOROSCOPY TIME:  Fluoroscopy Time: 7 minutes 43 seconds (764 mGy). COMPLICATIONS: None immediate. TECHNIQUE: Informed written consent was obtained from the patient after a thorough discussion of the procedural risks, benefits and alternatives. All questions were addressed. Maximal Sterile Barrier Technique was utilized  including caps, mask, sterile gowns, sterile gloves, sterile drape, hand hygiene and skin antiseptic. A timeout was performed prior to the initiation of the procedure. The right groin was prepped and draped in the usual sterile fashion. Thereafter using modified Seldinger technique, transfemoral access into the right common femoral artery was obtained without difficulty. Over a 0.035 inch guidewire, a 5 French Pinnacle sheath was inserted. Through this, and also over 0.035 inch guidewire, a 5 [place] JB 1 catheter was advanced to the aortic arch region and selectively positioned in the right common carotid artery, the right subclavian artery, the left common carotid  artery and the left subclavian artery. FINDINGS: The right subclavian arteriogram demonstrates widely patent right vertebral artery. The vessel is seen to opacify to the cranial skull base. Patency is seen of the right vertebrobasilar junction and the right posterior-inferior cerebellar artery. 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 The right common carotid arteriogram demonstrates the right external carotid artery and its major branches to be widely patent. The right internal carotid artery at the bulb has a smooth shallow plaque along the posterior wall without evidence of ulcerations. No evidence of intraluminal filling defects is seen either. More distally, the right internal carotid artery is seen to opacify to the cranial skull base. The petrous, cavernous and the supraclinoid segments are widely patent. The right posterior communicating artery is seen opacifying the right posterior cerebral artery, and transiently bilateral superior cerebellar arteries. The right middle cerebral artery and the right anterior cerebral artery opacify into the capillary and venous phases. A soft plaque is seen in the right middle cerebral artery proximally. The left common carotid arteriogram demonstrates  the left external carotid artery and its major branches to be widely patent. The left internal carotid artery at the bulb has a smooth shallow plaque along the medial aspect with approximately 10% stenosis by the NASCET criteria. Mild tortuosity is seen of the proximal 1/3 of the left internal carotid artery. More distally, the vessel assumes normal caliber to the cranial skull base. The petrous, the cavernous and the supraclinoid segments are widely patent. The left posterior communicating artery is seen opacifying the left posterior cerebral artery distribution. The left middle cerebral artery at its origin and proximally demonstrates a 50% stenosis secondary to a circumferential soft atherosclerotic plaque. The left MCA branches, and the left anterior cerebral artery opacify into the capillary and venous phases. The right subclavian arteriogram demonstrates non visualization of the left vertebral artery. Ascending cervical branch of the thyrocervical trunk is noted to be present. 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 PLAN: Endovascular treatment of mid basilar artery pre occlusive stenosis in approximately 10 days. Electronically Signed   By: Julieanne Cotton M.D.   On: 06/02/2018 13:44   Ir Angio Vertebral Sel Subclavian Innominate Uni R Mod Sed  Result Date: 06/05/2018 CLINICAL DATA:  Dysarthria with right-sided weakness. Abnormal MRI of the brain with bihemispheric DWI changes. Attenuated flow signal in the mid basilar artery on MRA examination. EXAM: BILATERAL COMMON CAROTID AND INNOMINATE ANGIOGRAPHY; LEFT EXTREMITY ARTERIOGRAPHY; IR ANGIO VERTEBRAL SEL SUBCLAVIAN INNOMINATE UNI RIGHT MOD SED COMPARISON:  MRI MRA of the brain of 05/30/2018. MEDICATIONS: Heparin 1000 units IV; No antibiotic was  administered within 1 hour of the procedure. ANESTHESIA/SEDATION: Versed 1 mg IV; Fentanyl 25 mcg IV.  Hydralazine 5 mg IV 1 time. Moderate Sedation Time:  28 minutes. The patient was continuously monitored during the procedure by the interventional radiology nurse under my direct supervision. CONTRAST:  Isovue 300 approximately 45 mL. FLUOROSCOPY TIME:  Fluoroscopy Time: 7 minutes 43 seconds (764 mGy). COMPLICATIONS: None immediate. TECHNIQUE: Informed written consent was obtained from the patient after a thorough discussion of the procedural risks, benefits and alternatives. All questions were addressed. Maximal Sterile Barrier Technique was utilized including caps, mask, sterile gowns, sterile gloves, sterile drape, hand hygiene and skin antiseptic. A timeout was performed prior to the initiation  of the procedure. The right groin was prepped and draped in the usual sterile fashion. Thereafter using modified Seldinger technique, transfemoral access into the right common femoral artery was obtained without difficulty. Over a 0.035 inch guidewire, a 5 French Pinnacle sheath was inserted. Through this, and also over 0.035 inch guidewire, a 5 [place] JB 1 catheter was advanced to the aortic arch region and selectively positioned in the right common carotid artery, the right subclavian artery, the left common carotid artery and the left subclavian artery. FINDINGS: The right subclavian arteriogram demonstrates widely patent right vertebral artery. The vessel is seen to opacify to the cranial skull base. Patency is seen of the right vertebrobasilar junction and the right posterior-inferior cerebellar artery. 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 The right common carotid arteriogram demonstrates the right external carotid artery and its major branches to be widely patent. The right internal carotid artery at the bulb has a smooth shallow plaque along the  posterior wall without evidence of ulcerations. No evidence of intraluminal filling defects is seen either. More distally, the right internal carotid artery is seen to opacify to the cranial skull base. The petrous, cavernous and the supraclinoid segments are widely patent. The right posterior communicating artery is seen opacifying the right posterior cerebral artery, and transiently bilateral superior cerebellar arteries. The right middle cerebral artery and the right anterior cerebral artery opacify into the capillary and venous phases. A soft plaque is seen in the right middle cerebral artery proximally. The left common carotid arteriogram demonstrates the left external carotid artery and its major branches to be widely patent. The left internal carotid artery at the bulb has a smooth shallow plaque along the medial aspect with approximately 10% stenosis by the NASCET criteria. Mild tortuosity is seen of the proximal 1/3 of the left internal carotid artery. More distally, the vessel assumes normal caliber to the cranial skull base. The petrous, the cavernous and the supraclinoid segments are widely patent. The left posterior communicating artery is seen opacifying the left posterior cerebral artery distribution. The left middle cerebral artery at its origin and proximally demonstrates a 50% stenosis secondary to a circumferential soft atherosclerotic plaque. The left MCA branches, and the left anterior cerebral artery opacify into the capillary and venous phases. The right subclavian arteriogram demonstrates non visualization of the left vertebral artery. Ascending cervical branch of the thyrocervical trunk is noted to be present. 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 PLAN: Endovascular treatment of mid  basilar artery pre occlusive stenosis in approximately 10 days. Electronically Signed   By: Julieanne Cotton M.D.   On: 06/02/2018 13:44    Labs:  Basic Metabolic Panel: Recent Labs  Lab 06/26/18 0018  CREATININE 1.03    CBC: No results for input(s): WBC, NEUTROABS, HGB, HCT, MCV, PLT in the last 168 hours.  CBG: Recent Labs  Lab 06/26/18 0611 06/26/18 1225 06/26/18 1654 06/26/18 2130 06/27/18 0634  GLUCAP 139* 143* 126* 120* 154*   Family history.  Negative for diabetes or hyperlipidemia.  Father with cancer  Brief HPI:    Kaveh  E. Wilber is a 68 year old right-handed male history of hypertension questionable medical compliance as well as alcohol use.  Lives with spouse independent prior to admission.  He is retired.  Presented 05/29/2018 with right side  weakness facial droop slurred speech.  Systolic blood pressure in the 240s.  Placed on Cleviprex drip.  Cranial CT scan showed age indeterminate lacunar infarct left thalamus.  Negative for hemorrhage.  CT angiogram of head and neck no emergent large vessel occlusion.  Patient did receive TPA.  MRI MRA showed acute perforator infarct in the left corona radiata.  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  Echocardiogram with ejection fraction of 65% normal systolic function.  Cerebral angiogram 06/02/2018 per interventional radiology showed severe preocclusive mid basilar artery stenosis with no plan for intervention.  Neurology follow-up maintained on aspirin and Plavix x3 months then Plavix alone.  Subcutaneous Lovenox for DVT prophylaxis.  New findings of elevated hemoglobin A1c of 6.5.  Tolerating a regular consistency diet.  Patient was admitted for a comprehensive rehab program  Hospital Course: THEOTIS GERDEMAN was admitted to rehab 06/05/2018 for inpatient therapies to consist of PT, ST and OT at least three hours five days a week. Past admission physiatrist, therapy  team and rehab RN have worked together to provide customized collaborative inpatient rehab.  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  Status post TPA.  Aspirin and Plavix x3 months then Plavix alone.  Patient would follow-up with neurology services.  Subcutaneous Lovenox for DVT prophylaxis no bleeding episodes.  Blood pressures controlled and monitored on HCTZ as well as Lopressor which was increased to 37.5 mg twice daily on 520 as well as continued Norvasc.  There was some question of medical noncompliance.  Patient was advised to follow-up with primary MD.  He continued on Lipitor for hyperlipidemia.  Mild elevations in hemoglobin A1c of 6.5 maintained on Glucophage full diabetic teaching completed again patient follow-up with primary MD.  There was some question of alcohol use no signs of withdrawal patient received counts regards to cessation of alcohol.  Physical exam.  Blood pressure 159/100 pulse 85 temperature 98.6 respirations 15 oxygen saturation 94% room air Neurological.  Mildly dysarthric speech but intelligible noted facial droop follows commands General.  No acute distress mood and affect appropriate Cardiovascular.  Regular rate and rhythm no murmurs rubs or extra sounds Respiratory.  Lungs are clear to auscultation no rales or wheezes Abdomen.  Positive bowel sounds soft nontender to palpation nondistended Extremities.  No clubbing cyanosis or edema Neurological.  Cranial nerves II through XII intact motor strength 5 out of 5 in the left and 4 out of 5 in the right deltoid bicep tricep grip hip flexors knee extensors ankle dorsi and plantar flexion.  Rehab course: During patient's stay in rehab weekly team conferences were held to monitor patient's progress, set goals and discuss barriers to discharge. At admission, patient required moderate assist stand pivot transfers, max assist 5 feet 2 person hand-held assist,  moderate assist supine to sit.  Moderate assist upper body bathing moderate assist upper body dressing, max assist lower body dressing moderate assist toilet transfers  He  has had improvement in activity tolerance, balance, postural control as well as ability to compensate for deficits. He has had improvement in functional use RUE/LUE  and RLE/LLE as well as improvement in awareness.  Patient performs ambulation with rolling walker and right AFO with contact-guard assist 50 feet x 3.  Transfer training blocked practice without assistive device contact-guard.  Perform stair negotiation using reverse technique and rolling walker.  Patient completed bed mobility with minimal assistance and  completed lower body dressing and sit to stand level with contact-guard assist for standing balance.  Patient able to don right shoe with increased time and assistance to position due to AFO.  Engaged in dynamic standing balance incorporating reaching with right upper extremity.  Contact-guard assist for sit to stand and intermittent cues and tactile cues for weight shifting.  Patient did require minimal assist cues for selective attention and moderately distracting environment.  He was able to communicate his needs.  Full family teaching was completed with plan discharge to home       Disposition: Discharge to home   Diet: Diabetic diet  Special Instructions: No driving, smoking or alcohol  Aspirin and Plavix x3 months then Plavix alone  Medications at discharge. 1.  Tylenol as needed 2.  Norvasc 10 mg p.o. daily 3.  Aspirin 81 mg p.o. daily 4.  Lipitor 40 mg p.o. daily 5.  Plavix 75 mg p.o. daily 6.  Folic acid 1 mg daily 7.  HCTZ 12.5 mg p.o. daily 8.  Melatonin 3 mg nightly 9.  Glucophage 500 mg p.o. twice daily 10.  Lopressor 37.5 mg p.o. twice daily 11.  Multivitamin daily 12.  Protonix 40 mg p.o. daily 13.  MiraLAX daily hold for loose stools  Discharge Instructions    Ambulatory referral to  Neurology   Complete by:  As directed    An appointment is requested in approximately 4 weeks left basal ganglia corona radiata infarction   Ambulatory referral to Physical Medicine Rehab   Complete by:  As directed    Moderate complexity follow-up 1 to 2 weeks left basal ganglia corona radiata infarction      Follow-up Information    Marcello FennelPatel, Randol Zumstein Anil, MD Follow up.   Specialty:  Physical Medicine and Rehabilitation Why:  Office to call for appointment Contact information: [address] [place] [address] [phone]        Rinaldo CloudHarwani, Mohan, MD Follow up.   Specialty:  Cardiology Contact information: [address] [place] [address] [phone]           Signed: Charlton AmorDaniel J Angiulli 06/27/2018, 3:43 PM Patient seen and examined by me on day of discharge. Maryla MorrowAnkit Wyndell Cardiff, MD, ABPMR

## 2018-06-26 NOTE — Progress Notes (Signed)
Social Work  Discharge Note  The overall goal for the admission was met for:   Discharge location: Yes-HOME WITH WIFE AND SON-24 HR CARE  Length of Stay: Yes-22 DAYS  Discharge activity level: Yes-MIN ASSIST LEVEL  Home/community participation: Yes  Services provided included: MD, RD, PT, OT, SLP, RN, CM, Pharmacy, Neuropsych and SW  Financial Services: Private Insurance: [facility]  Follow-up services arranged: Home Health: [facility]-PT, OT,SP, AIDE, DME: [facility]-WHEELCHAIR, ROLLING WALKER, 3 IN 1, AND TUB BENCH and Patient/Family has no preference for HH/DME agencies  Comments (or additional information):WIFE WAS IN [place]. AWARE PT WILL REQUIRE 24 HR PHYSICAL CARE AT [place]. SHE PLANS TO SHOW THEIR SON PT'S CARE, SINCE HE COULD NOT BE HERE FOR EDUCATION  Patient/Family verbalized understanding of follow-up arrangements: Yes  Individual responsible for coordination of the follow-up plan: EVA-WIFE  Confirmed correct DME delivered: Elease Hashimoto 06/27/2018    Elease Hashimoto

## 2018-06-26 NOTE — Progress Notes (Signed)
Occupational Therapy Discharge Summary  Patient Details  Name: Joe Wood MRN: [id number] Date of Birth: 12/28/1950  Today's Date: 06/26/2018 OT Individual Time: 1250-1330 OT Individual Time Calculation (min): 40 min   1:! GRAD day! Self care retraining at shower level. Pt ambulated into the bathroom with right AFO and shoes with RW with right hand orthosis with min A with mod cues. Pt stood at the commode with RW over the commode to void. Min A for standing balance. Pt required extra time for clothing management. Pt ambulated over to the shower and bathed sit to stand on 3:1. Pt able to bathe all 10 parts sit to stand with contact guard. Pt demonstrated use of right hand at non dominant level without requiring cuing. Pt dressed sit to stand with min A for LB and supervision for UB. Pt ambulated out of bathroom to the recliner to eat his lunch. Pt does require A to don right shoe with AFO   Patient has met 13 of 13 long term goals due to improved activity tolerance, improved balance, postural control, ability to compensate for deficits, functional use of  RIGHT upper and RIGHT lower extremity, improved attention, improved awareness and improved coordination.  Patient to discharge at [facility] Assist level. Pt can shower with contact guard and dress supervision to min A. Pt ambulates with right AFO, RW with hand orthosis with min A   Patient's care partner is independent to provide the necessary physical and cognitive assistance at discharge.    Reasons goals not met:   Recommendation:  Patient will benefit from ongoing skilled OT services in home health setting to continue to advance functional skills in the area of BADL and Reduce care partner burden.  Equipment: BSC, w/c with cushion, RW with hand splint and tub bench  Reasons for discharge: treatment goals met and discharge from hospital  Patient/family agrees with progress made and goals achieved: Yes  OT  Discharge Precautions/Restrictions  Precautions Precautions: Fall Precaution Comments: decreased R side awareness Restrictions Weight Bearing Restrictions: No General   Vital Signs   Pain Pain Assessment Pain Score: 0-No pain ADL ADL Eating: Set up Grooming: Setup Where Assessed-Grooming: Sitting at sink Upper Body Bathing: Supervision/safety Where Assessed-Upper Body Bathing: Shower Lower Body Bathing: Contact guard Where Assessed-Lower Body Bathing: Shower Upper Body Dressing: Setup Where Assessed-Upper Body Dressing: Chair Lower Body Dressing: Minimal assistance Where Assessed-Lower Body Dressing: Chair Toileting: Minimal assistance Where Assessed-Toileting: Glass blower/designer: Therapist, music Method: Counselling psychologist: Grab bars(RW ) Social research officer, government: Environmental education officer Method: Heritage manager: Grab bars(3:1) Vision Baseline Vision/History: Wears glasses Wears Glasses: Reading only Patient Visual Report: No change from baseline Vision Assessment?: Yes Eye Alignment: Within Functional Limits Ocular Range of Motion: Within Functional Limits Alignment/Gaze Preference: Within Defined Limits Tracking/Visual Pursuits: Able to track stimulus in all quads without difficulty Perception  Perception: (improved attention to right side of environment and body) Inattention/Neglect: Does not attend to right side of body;Does not attend to right visual field Praxis Praxis: Impaired Praxis Impairment Details: Motor planning Cognition Overall Cognitive Status: Impaired/Different from baseline Arousal/Alertness: Awake/alert Orientation Level: Oriented X4 Attention: Sustained;Selective;Alternating Focused Attention: Appears intact Sustained Attention: Appears intact Selective Attention: Appears intact Alternating Attention: Impaired Alternating Attention Impairment: Functional  complex Memory: Impaired Memory Impairment: Retrieval deficit;Decreased recall of new information;Decreased short term memory Awareness: Impaired Awareness Impairment: Anticipatory impairment Problem Solving: Impaired Problem Solving Impairment: Functional complex Safety/Judgment: Impaired Sensation Sensation Light Touch:  Appears Intact Hot/Cold: Appears Intact Proprioception: Appears Intact Stereognosis: Impaired Detail Stereognosis Impaired Details: Impaired LLE Coordination Gross Motor Movements are Fluid and Coordinated: No Coordination and Movement Description: coordination in right UE is much improved - able to pick up small items Finger Nose Finger Test: able to perform finger to nose  Motor  Motor Motor: Hemiplegia;Abnormal postural alignment and control;Motor apraxia;Ataxia Motor - Discharge Observations: R side weakness, decreased coordination R, decreased motor planning Mobility  Transfers Sit to Stand: Contact Guard/Touching assist Stand to Sit: Supervision/Verbal cueing  Trunk/Postural Assessment  Cervical Assessment Cervical Assessment: Exceptions to WFL(forward head) Thoracic Assessment Thoracic Assessment: Exceptions to WFL(R side elongation) Lumbar Assessment Lumbar Assessment: Exceptions to WFL(post pelvic tilt) Postural Control Postural Control: Deficits on evaluation Trunk Control: R lean in sitting and standing Righting Reactions: slowed Protective Responses: mod impaired  Balance Static Sitting Balance Static Sitting - Level of Assistance: 5: Stand by assistance Dynamic Sitting Balance Dynamic Sitting - Level of Assistance: 5: Stand by assistance Sitting balance - Comments: donning shoes Static Standing Balance Static Standing - Level of Assistance: 5: Stand by assistance Dynamic Standing Balance Dynamic Standing - Balance Support: During functional activity Dynamic Standing - Level of Assistance: 4: Min assist Extremity/Trunk Assessment RUE  Assessment Passive Range of Motion (PROM) Comments: WFL Active Range of Motion (AROM) Comments: WFL General Strength Comments: 3/5  RUE Body System: Neuro Brunstrum levels for arm and hand: Arm;Hand Brunstrum level for arm: Stage IV Movement is deviating from synergy Brunstrum level for hand: Stage V Independence from basic synergies RUE Tone RUE Tone: Modified Ashworth Body Part - Modified Ashworth Scale: Elbow;Wrist;Fingers;Thumb Elbow - Modified Ashworth Scale for Grading Hypertonia RUE: Slight increase in muscle tone, manifested by a catch and release or by minimal resistance at the end of the range of motion when the affected part(s) is moved in flexion or extension Wrist - Modified Ashworth Scale for Grading Hypertonia RUE: Slight increase in muscle tone, manifested by a catch and release or by minimal resistance at the end of the range of motion when the affected part(s) is moved in flexion or extension Fingers - Modified Ashworth Scale for Grading Hypertonia RUE: No increase in muscle tone Thumb - Modified Ashworth Scale for Grading Hypertonia RUE: Slight increase in muscle tone, manifested by a catch and release or by minimal resistance at the end of the range of motion when the affected part(s) is moved in flexion or extension LUE Assessment LUE Assessment: Within Functional Limits   Willeen Cass [facility] 06/26/2018, 2:27 PM

## 2018-06-27 LAB — GLUCOSE, CAPILLARY: Glucose-Capillary: 154 mg/dL — ABNORMAL HIGH (ref 70–99)

## 2018-06-27 MED ORDER — POLYETHYLENE GLYCOL 3350 17 G PO PACK: 17.0000 g | PACK | Freq: Every day | ORAL | 0 refills | Status: DC

## 2018-06-27 MED ORDER — ATORVASTATIN CALCIUM 40 MG PO TABS: 40.0000 mg | ORAL_TABLET | Freq: Every day | ORAL | 1 refills | Status: AC

## 2018-06-27 MED ORDER — PANTOPRAZOLE SODIUM 40 MG PO TBEC: 40.0000 mg | DELAYED_RELEASE_TABLET | Freq: Every day | ORAL | 0 refills | Status: DC

## 2018-06-27 MED ORDER — HYDROCHLOROTHIAZIDE 12.5 MG PO CAPS: 12.5000 mg | ORAL_CAPSULE | Freq: Every day | ORAL | 1 refills | Status: DC

## 2018-06-27 MED ORDER — FOLIC ACID 1 MG PO TABS: 1.0000 mg | ORAL_TABLET | Freq: Every day | ORAL | 0 refills | Status: DC

## 2018-06-27 MED ORDER — METOPROLOL TARTRATE 37.5 MG PO TABS: 37.5000 mg | ORAL_TABLET | Freq: Two times a day (BID) | ORAL | 1 refills | Status: DC

## 2018-06-27 MED ORDER — METFORMIN HCL 500 MG PO TABS: 500.0000 mg | ORAL_TABLET | Freq: Two times a day (BID) | ORAL | 1 refills | Status: DC

## 2018-06-27 MED ORDER — ASPIRIN 81 MG PO TBEC: 81.0000 mg | DELAYED_RELEASE_TABLET | Freq: Every day | ORAL | 12 refills | Status: AC

## 2018-06-27 MED ORDER — ACETAMINOPHEN 325 MG PO TABS: 650.0000 mg | ORAL_TABLET | ORAL | Status: DC | PRN

## 2018-06-27 MED ORDER — AMLODIPINE BESYLATE 10 MG PO TABS: 10.0000 mg | ORAL_TABLET | Freq: Every day | ORAL | 0 refills | Status: DC

## 2018-06-27 MED ORDER — CLOPIDOGREL BISULFATE 75 MG PO TABS: 75.0000 mg | ORAL_TABLET | Freq: Every day | ORAL | 1 refills | Status: AC

## 2018-06-27 MED ORDER — MELATONIN 3 MG PO TABS: 3.0000 mg | ORAL_TABLET | Freq: Every day | ORAL | 0 refills | Status: DC

## 2018-06-27 NOTE — Progress Notes (Signed)
Patient discharged to home with wife and son. All discharge instructions were given by PA. Patient verbalized understanding.

## 2018-07-01 ENCOUNTER — Telehealth: Payer: Self-pay

## 2018-07-01 NOTE — Telephone Encounter (Signed)
  1st attempt at a [place] made, not sure if hung up on or simply lost contact, will try again later to make call.  Patient name: Joe Wood [facility])  DOB: (August 21, 1950)                                            Appointment date/time: (07-11-2018 / 240PM)  Arrive time: (220PM)  With: (DR. PATEL)   [facility] Physical Medicine and [place]   [phone]

## 2018-07-01 NOTE — Telephone Encounter (Signed)
  TRANSITIONAL CARE CALL  Patient name: Joe Wood [facility])  DOB: (Oct 20, 1950)      1. Are you/is patient experiencing any problems since coming home? (NO)  a. Are there any questions regarding any aspect of care? (NA)   2. Are there any questions regarding medications administration/dosing? (NO)  a. Are meds being taken as prescribed? (YES)   3. Have there been any falls? (YES, NO HEAD INJURY OR OTHER INJURIES.)   4. Has Home Health been to the house and/or have they contacted you? (NO)  a. If not, have you tried to contact them? (NO)  b. Can we help you contact them? (NO)   5. Are bowels and bladder emptying properly? (NO)  a. Are there any unexpected incontinence issues? (NO)  b. If applicable, is patient following bowel/bladder programs? (NO)   6. Any fevers, problems with breathing, or unexpected pain? (NO)    7. Are there any skin problems or new areas of breakdown? (NO)   8. Has the patient/family member arranged specialty MD follow up? (ie, cardiology/neuro) (YES)  a. Can we help arrange? (NO)   9. Does the patient need any other services or support that we can help arrange? (NO)   10. Are caregivers following through as expected in assisting the patient? (NA)   11. Has the patient quit smoking, drinking alcohol, or using drugs as recommended? (NA)   Appointment date/time: (07-11-2018 / 240PM)  Arrive time: (220PM)  With: (DR PATEL)   [facility] Physical Medicine and [place]   [phone]

## 2018-07-10 ENCOUNTER — Telehealth: Payer: Self-pay

## 2018-07-10 NOTE — Telephone Encounter (Signed)
Mallory, OT from [facility] called requesting verbal orders for HHOT 1wk1, 2wk6, 1wk1. Orders approved and given per discharge summary.

## 2018-07-11 ENCOUNTER — Other Ambulatory Visit: Payer: Self-pay

## 2018-07-11 ENCOUNTER — Encounter: Payer: Self-pay | Admitting: Physical Medicine & Rehabilitation

## 2018-07-11 ENCOUNTER — Encounter: Payer: Medicare Other | Attending: Physical Medicine & Rehabilitation | Admitting: Physical Medicine & Rehabilitation

## 2018-07-11 VITALS — BP 156/85 | HR 70 | Temp 98.3°F | Ht 65.0 in | Wt 179.0 lb

## 2018-07-11 DIAGNOSIS — E119 Type 2 diabetes mellitus without complications: Secondary | ICD-10-CM | POA: Insufficient documentation

## 2018-07-11 DIAGNOSIS — I69359 Hemiplegia and hemiparesis following cerebral infarction affecting unspecified side: Secondary | ICD-10-CM | POA: Insufficient documentation

## 2018-07-11 DIAGNOSIS — R269 Unspecified abnormalities of gait and mobility: Secondary | ICD-10-CM

## 2018-07-11 DIAGNOSIS — G479 Sleep disorder, unspecified: Secondary | ICD-10-CM | POA: Diagnosis not present

## 2018-07-11 DIAGNOSIS — I10 Essential (primary) hypertension: Secondary | ICD-10-CM | POA: Insufficient documentation

## 2018-07-11 MED ORDER — TRAZODONE HCL 50 MG PO TABS: 25.0000 mg | ORAL_TABLET | Freq: Every day | ORAL | 1 refills | Status: DC

## 2018-07-11 NOTE — Progress Notes (Signed)
Subjective:    Patient ID: Joe Wood, male    DOB: 08/31/1950, 68 y.o.   MRN: [id number]  HPI Joe  E. Lillia Wood is a 68 year old right-handed male history of hypertension presents for transitional care management after receiving CIR for bilateral punctate infarcts with largest infarct in left leftBG/CR.   Since discharge, he was told to follow up with Cards but has not.  Family provides history. Notes confusion at night. BP is elevated, but normally WNL per pt.  He has not had CBG checks.  He does not have a PCP. Denies falls.  Therapies: Eval beginning.  Mobility: Walker at home, wheelchair in community.  DME; Bedside commode, shower chair  Pain Inventory Average Pain 0 Pain Right Now 0 My pain is na  In the last 24 hours, has pain interfered with the following? General activity 0 Relation with others 0 Enjoyment of life 0 What TIME of day is your pain at its worst? na Sleep (in general) Poor  Pain is worse with: na Pain improves with: na Relief from Meds: na  Mobility walk with assistance use a walker how many minutes can you walk? 10 ability to climb steps?  no do you drive?  no use a wheelchair  Function not employed: date last employed na I need assistance with the following:  dressing, bathing and toileting  Neuro/Psych numbness trouble walking confusion  Prior Studies Any changes since last visit?  no  Physicians involved in your care Any changes since last visit?  no   Family History  Problem Relation Age of Onset  . Cancer Father    Social History   Socioeconomic History  . Marital status: Married    Spouse name: Not on file  . Number of children: Not on file  . Years of education: Not on file  . Highest education level: Not on file  Occupational History  . Not on file  Social Needs  . Financial resource strain: Not on file  . Food insecurity    Worry: Not on file    Inability: Not on file  . Transportation needs    Medical:  Not on file    Non-medical: Not on file  Tobacco Use  . Smoking status: Never Smoker  . Smokeless tobacco: Never Used  Substance and Sexual Activity  . Alcohol use: Yes    Alcohol/week: 0.0 standard drinks  . Drug use: No  . Sexual activity: Not on file  Lifestyle  . Physical activity    Days per week: Not on file    Minutes per session: Not on file  . Stress: Not on file  Relationships  . Social Musicianconnections    Talks on phone: Not on file    Gets together: Not on file    Attends religious service: Not on file    Active member of club or organization: Not on file    Attends meetings of clubs or organizations: Not on file    Relationship status: Not on file  Other Topics Concern  . Not on file  Social History Narrative  . Not on file   Past Surgical History:  Procedure Laterality Date  . IR ANGIO INTRA EXTRACRAN SEL COM CAROTID INNOMINATE BILAT MOD SED  06/02/2018  . IR ANGIO VERTEBRAL SEL SUBCLAVIAN INNOMINATE UNI R MOD SED  06/02/2018  . IR ANGIOGRAM EXTREMITY LEFT  06/02/2018   Past Medical History:  Diagnosis Date  . Hypertension    BP (!) 156/85   Pulse  70   Temp 98.3 F (36.8 C)   Ht 5\' 5"  (1.651 m)   Wt 179 lb (81.2 kg)   SpO2 97%   BMI 29.79 kg/m   Opioid Risk Score:   Fall Risk Score:  `1  Depression screen PHQ 2/9  Depression screen [facility] 2/9 07/11/2018 07/17/2014  Decreased Interest 1 0  Down, Depressed, Hopeless 0 0  PHQ - 2 Score 1 0  Altered sleeping 3 -  Tired, decreased energy 1 -  Change in appetite 0 -  Feeling bad or failure about yourself  0 -  Trouble concentrating 2 -  Moving slowly or fidgety/restless 2 -  Suicidal thoughts 0 -  PHQ-9 Score 9 -    Review of Systems  Constitutional: Negative.   HENT: Negative.   Eyes: Negative.   Respiratory: Negative.   Cardiovascular: Negative.   Gastrointestinal: Negative.   Endocrine: Negative.   Genitourinary: Negative.   Musculoskeletal: Negative.   Skin: Negative.    Allergic/Immunologic: Negative.   Neurological: Negative.   Hematological: Negative.   Psychiatric/Behavioral: Negative.   All other systems reviewed and are negative.      Objective:   Physical Exam Constitutional: No distress . Vital signs reviewed. HENT: Normocephalic.  Atraumatic. Eyes: EOMI. No discharge.   Cardiovascular: No JVD. Respiratory: Normal effort. GI: Non-distended. Musc: No edema or tenderness in extremities. Neurological:  Alert Makes eye contact with examiner.  Follows commands Motor:  RUE: Shoulder abduction 4/5, distally 4-4+/5 with apraxia Right lower extremity: Hip flexion, knee extension 4/5, ankle dorsiflexion 4-/5 with apraxia Skin: Warm and dry.  Intact. Psych: Normal mood.  Normal behavior.    Assessment & Plan:  Joe Wood is a 68 year old right-handed male history of hypertension presents for transitional care management after receiving CIR for bilateral punctate infarcts with largest infarct in left leftBG/CR.   1.  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  Status post TPA.    Cont therapies  Follow up with Neuro- needs appointment  Follow up with Cards/PCP - needs appointment  Cont ASA Plavix x3 months, then Plavix  2. Hypertension   Cont meds  Elevated today, controlled at home, per family  3 New findings type 2 diabetes mellitus.    Check CBGs  Encouraged follow up with PCP  4. Sleep disturbance  Cont Melatonin  Will order trazodone 25 daily  5. Constipation  Improved  6. Gait abnormality  Cont walker for safety  Cont therapies  Meds reviewed Referrals reviewed - Needs Cards, PCP, Neuro All questions answered

## 2018-07-14 ENCOUNTER — Telehealth: Payer: Self-pay

## 2018-07-14 NOTE — Telephone Encounter (Signed)
Aldin Mitchel [facility] called requesting verbal orders for 1xwk X 1wk followed by 2xwk X 4wks. Called her back and approved verbal orders.

## 2018-08-14 ENCOUNTER — Inpatient Hospital Stay: Payer: Medicare PPO | Admitting: Adult Health

## 2018-08-18 DIAGNOSIS — I69392 Facial weakness following cerebral infarction: Secondary | ICD-10-CM

## 2018-08-18 DIAGNOSIS — Z7984 Long term (current) use of oral hypoglycemic drugs: Secondary | ICD-10-CM

## 2018-08-18 DIAGNOSIS — E785 Hyperlipidemia, unspecified: Secondary | ICD-10-CM

## 2018-08-18 DIAGNOSIS — F1010 Alcohol abuse, uncomplicated: Secondary | ICD-10-CM

## 2018-08-18 DIAGNOSIS — E119 Type 2 diabetes mellitus without complications: Secondary | ICD-10-CM

## 2018-08-18 DIAGNOSIS — I69322 Dysarthria following cerebral infarction: Secondary | ICD-10-CM

## 2018-08-18 DIAGNOSIS — I10 Essential (primary) hypertension: Secondary | ICD-10-CM

## 2018-08-18 DIAGNOSIS — I69351 Hemiplegia and hemiparesis following cerebral infarction affecting right dominant side: Secondary | ICD-10-CM

## 2018-08-18 DIAGNOSIS — G479 Sleep disorder, unspecified: Secondary | ICD-10-CM

## 2018-08-19 ENCOUNTER — Emergency Department (HOSPITAL_COMMUNITY)
Admission: EM | Admit: 2018-08-19 | Discharge: 2018-08-19 | Disposition: A | Discharge status: Home / Self Care | Payer: Medicare Other | Attending: Emergency Medicine | Admitting: Emergency Medicine

## 2018-08-19 ENCOUNTER — Other Ambulatory Visit: Payer: Self-pay

## 2018-08-19 ENCOUNTER — Telehealth: Payer: Self-pay

## 2018-08-19 DIAGNOSIS — Z8673 Personal history of transient ischemic attack (TIA), and cerebral infarction without residual deficits: Secondary | ICD-10-CM | POA: Diagnosis not present

## 2018-08-19 DIAGNOSIS — E119 Type 2 diabetes mellitus without complications: Secondary | ICD-10-CM | POA: Insufficient documentation

## 2018-08-19 DIAGNOSIS — Z7902 Long term (current) use of antithrombotics/antiplatelets: Secondary | ICD-10-CM | POA: Insufficient documentation

## 2018-08-19 DIAGNOSIS — Z7984 Long term (current) use of oral hypoglycemic drugs: Secondary | ICD-10-CM | POA: Insufficient documentation

## 2018-08-19 DIAGNOSIS — I10 Essential (primary) hypertension: Secondary | ICD-10-CM | POA: Diagnosis present

## 2018-08-19 DIAGNOSIS — Z79899 Other long term (current) drug therapy: Secondary | ICD-10-CM | POA: Insufficient documentation

## 2018-08-19 MED ORDER — AMLODIPINE BESYLATE 10 MG PO TABS: 10.0000 mg | ORAL_TABLET | Freq: Every day | ORAL | 0 refills | Status: DC

## 2018-08-19 NOTE — ED Triage Notes (Signed)
Pt presents via EMS with c/o hypertension.   Pt at home asymptomatic and took regular screening BP which was elevated and call ambulance. Pt denies any CP, SOB or dizziness.  Had previous CVA 1 month ago with similar sx.

## 2018-08-19 NOTE — Discharge Instructions (Signed)
Continue taking your medications as directed.  As we discussed, I refilled your amlodipine.  Please follow-up with your primary care doctor.  Return emergency department for any chest pain, difficulty breathing, vision changes, headache, numbness/weakness of arms or legs or any other worsening concerning symptoms.

## 2018-08-19 NOTE — ED Provider Notes (Signed)
[facility] Note   CSN: [id number] Arrival date & time: 08/19/18  1802    History   Chief Complaint No chief complaint on file.   HPI Joe LofflerDarryl E Lillia AbedLindsay is a 68 y.o. male with PMH/o HTN, CVA, DM, and by EMS for evaluation of hypertension.  Joe Wood reports that he checked his blood pressure at about 6 PM this evening and noted systolic blood pressures in the 200s.  He states he did not have any symptoms at that time but states he continued to check it and it continued being elevated.  He states he became concerned and called EMS to bring him to the hospital.  Joe Wood has a history of high blood pressure and states that he is on medications for it though he cannot recall of them.  He does not think that he has missed any doses of his medications but he does states that there is been an issue with a few of his medications.  He states that he recently had a stroke so he was concerned and called EMS.  Joe Wood states he is not having any chest pain, shortness of breath, dizziness, vision changes, numbness/weakness of his arms or legs.  He states he checks his blood pressure daily but does not recall what it has been running over the last few weeks.     The history is provided by the Joe Wood.    Past Medical History:  Diagnosis Date  . Hypertension     Joe Wood Active Problem List   Diagnosis Date Noted  . 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 Slow transit constipation   . Sleep disturbance   . Labile blood pressure   . Labile blood glucose   . Cerebellar stroke, acute (HCC)   . Acute cerebral infarction (HCC)   . Hyponatremia   . Diabetes mellitus, new onset (HCC)   . Essential hypertension   . History of alcohol abuse   . Basal ganglia stroke (HCC) 06/05/2018  . 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 Hypertensive emergency 06/04/2018  . Hyperlipidemia LDL goal <70 06/04/2018  .  Diabetes mellitus, type II (HCC) 06/04/2018  . Alcohol abuse 06/04/2018  . Acute ischemic stroke [facility](HCC) - s/p tPA, d/t large vessel disease 05/29/2018    Past Surgical History:  Procedure Laterality Date  . IR ANGIO INTRA EXTRACRAN SEL COM CAROTID INNOMINATE BILAT MOD SED  06/02/2018  . IR ANGIO VERTEBRAL SEL SUBCLAVIAN INNOMINATE UNI R MOD SED  06/02/2018  . IR ANGIOGRAM EXTREMITY LEFT  06/02/2018        Home Medications    Prior to Admission medications   Medication Sig Start Date End Date Taking? Authorizing Provider  acetaminophen (TYLENOL) 325 MG tablet Take 2 tablets (650 mg total) by mouth every 4 (four) hours as needed for mild pain (or temp > 37.5 C (99.5 F)). 06/27/18   Angiulli, Mcarthur Rossettianiel J, PA-C  amLODipine (NORVASC) 10 MG tablet Take 1 tablet (10 mg total) by mouth daily. 08/19/18   Maxwell CaulLayden, Preslie Depasquale A, PA-C  atorvastatin (LIPITOR) 40 MG tablet Take 1 tablet (40 mg total) by mouth daily at 6 PM. 06/27/18   Angiulli, Mcarthur Rossettianiel J, PA-C  clopidogrel (PLAVIX) 75 MG tablet Take 1 tablet (75 mg total) by mouth daily. 06/27/18   Angiulli, Mcarthur Rossettianiel J, PA-C  folic acid (FOLVITE) 1 MG tablet Take 1 tablet (1 mg total) by mouth daily. 06/27/18   Angiulli, Mcarthur Rossettianiel J, PA-C  hydrochlorothiazide (MICROZIDE) 12.5 MG capsule Take 1 capsule (12.5 mg total) by mouth daily. 06/27/18   Angiulli, Mcarthur Rossettianiel J, PA-C  Melatonin 3 MG TABS Take 1 tablet (3 mg total) by mouth at bedtime. 06/27/18   Angiulli, Mcarthur Rossettianiel J, PA-C  metFORMIN (GLUCOPHAGE) 500 MG tablet Take 1 tablet (500 mg total) by mouth 2 (two) times daily with a meal. 06/27/18   Angiulli, Mcarthur Rossettianiel J, PA-C  metoprolol tartrate 37.5 MG TABS Take 37.5 mg by mouth 2 (two) times daily. 06/27/18   Angiulli, Mcarthur Rossettianiel J, PA-C  Multiple Vitamin (MULTIVITAMIN WITH MINERALS) TABS tablet Take 1 tablet by mouth daily. 06/05/18   Layne BentonBiby, Sharon L, NP  pantoprazole (PROTONIX) 40 MG tablet Take 1 tablet (40 mg total) by mouth daily. 06/27/18   Angiulli, Mcarthur Rossettianiel J, PA-C  polyethylene glycol (MIRALAX  / GLYCOLAX) 17 g packet Take 17 g by mouth daily. 06/27/18   Angiulli, Mcarthur Rossettianiel J, PA-C  traZODone (DESYREL) 50 MG tablet Take 0.5 tablets (25 mg total) by mouth at bedtime. 07/11/18   Marcello FennelPatel, Ankit Anil, MD    Family History Family History  Problem Relation Age of Onset  . Cancer Father     Social History Social History   Tobacco Use  . Smoking status: Never Smoker  . Smokeless tobacco: Never Used  Substance Use Topics  . Alcohol use: Yes    Alcohol/week: 0.0 standard drinks  . Drug use: No     Allergies   Joe Wood has no known allergies.   Review of Systems Review of Systems  Eyes: Negative for visual disturbance.  Respiratory: Negative for cough and shortness of breath.   Cardiovascular: Negative for chest pain.  Gastrointestinal: Negative for abdominal pain, nausea and vomiting.  Genitourinary: Negative for dysuria.  Neurological: Negative for weakness, numbness and headaches.  All other systems reviewed and are negative.    Physical Exam Updated Vital Signs BP (!) 180/97   Pulse 67   Temp 98.4 F (36.9 C) (Oral)   Resp 16   SpO2 95%   Physical Exam Vitals signs and nursing note reviewed.  Constitutional:      Appearance: Normal appearance. He is well-developed.  HENT:     Head: Normocephalic and atraumatic.  Eyes:     General: Lids are normal.     Conjunctiva/sclera: Conjunctivae normal.     Pupils: Pupils are equal, round, and reactive to light.     Comments: PERRL. No nystagmus, no neglect.   Neck:     Musculoskeletal: Full passive range of motion without pain.  Cardiovascular:     Rate and Rhythm: Normal rate and regular rhythm.     Pulses: Normal pulses.          Radial pulses are 2+ on the right side and 2+ on the left side.       Dorsalis pedis pulses are 2+ on the right side and 2+ on the left side.     Heart sounds: Normal heart sounds. No murmur. No friction rub. No gallop.   Pulmonary:     Effort: Pulmonary effort is normal.     Breath  sounds: Normal breath sounds.     Comments: Lungs clear to auscultation bilaterally.  Symmetric chest rise.  No wheezing, rales, rhonchi. Abdominal:     Palpations: Abdomen is soft. Abdomen is not rigid.     Tenderness: There is no abdominal tenderness. There is no guarding.  Musculoskeletal: Normal range of motion.  Skin:    General: Skin is warm and  dry.     Capillary Refill: Capillary refill takes less than 2 seconds.  Neurological:     Mental Status: He is alert and oriented to person, place, and time.     Comments: Cranial nerves III-XII intact Follows commands, Moves all extremities  5/5 strength to LUE and LLE. Mild right sided weakness consistent with history of hemiparesis after stroke.   Sensation intact throughout all major nerve distributions Normal coordination  No slurred speech. No facial droop.   Psychiatric:        Speech: Speech normal.      ED Treatments / Results  Labs (all labs ordered are listed, but only abnormal results are displayed) Labs Reviewed - No data to display  EKG EKG Interpretation  Date/Time:  Tuesday August 19 2018 18:10:33 EDT Ventricular Rate:  66 PR Interval:    QRS Duration: 89 QT Interval:  418 QTC Calculation: 438 R Axis:   -35 Text Interpretation:  Sinus rhythm Left axis deviation Nonspecific T wave abnormality No previous tracing Confirmed by Lajean Saver [phone]) on 08/19/2018 6:32:05 PM   Radiology No results found.  Procedures Procedures (including critical care time)  Medications Ordered in ED Medications - No data to display   Initial Impression / Assessment and Plan / ED Course  I have reviewed the triage vital signs and the nursing notes.  Pertinent labs & imaging results that were available during my care of the Joe Wood were reviewed by me and considered in my medical decision making (see chart for details).        68 year old male past history of hypertension, CVA who presents for evaluation of  hypertension.  Reports that he was checking his blood pressure this evening and noted elevated blood pressures with systolic in the 408X.  He states that he has been taking his blood pressure medication though he does note that there has been a change in 1 of the medications.  Initially arrival, he is afebrile.  He is hypertensive with blood pressure of 199/98.  He is otherwise stable.  No new neuro deficits on exam.  No complaints at this time.  Discussed with Joe Wood's wife Emrik Erhard) via phone.  She reports that he had been taking all of his medications at discharge but when he went to refill them at [facility], they were missing some of his medications.  I went through the list of medications and he has been taking metformin, Plavix, HCTZ, metoprolol, atorvastatin and trazodone.  He does have amlodipine listed on his medications.  I reviewed his records and show that he was discharged on till milligrams of amlodipine.  Again additionally, I reviewed his primary care doctor follow-up appointments and saw that he was on amlodipine.  Joe Wood's wife states that this was a medication that was not refilled.  I discussed with Joe Wood's wife that we can refill the medication but encourage follow-up with primary care doctor and close evaluation.  Instructed Joe Wood's wife to monitor his blood pressures.  At this time, Joe Wood is resting comfortably in the ED.  His blood pressures improved without any intervention with systolic blood pressures in the 160s.  He has no complaints at this time.  Vital signs are stable.  At this time, this is asymptomatic hypertension.  I suspect this may be due to not taking amlodipine.  No indication for further work-up here in the ED. Discussed Joe Wood with Dr. Ashok Cordia who is agreeable to plan. At this time, Joe Wood exhibits no emergent life-threatening condition that require further  evaluation in ED or admission. Joe Wood had ample opportunity for questions and discussion. All Joe Wood's  questions were answered with full understanding. Strict return precautions discussed. Joe Wood expresses understanding and agreement to plan.   Portions of this note were generated with Scientist, clinical (histocompatibility and immunogenetics)Dragon dictation software. Dictation errors may occur despite best attempts at proofreading.  Final Clinical Impressions(s) / ED Diagnoses   Final diagnoses:  Essential hypertension    ED Discharge Orders         Ordered    amLODipine (NORVASC) 10 MG tablet  Daily     08/19/18 2025           Rosana HoesLayden, Ginnifer Creelman A, PA-C 08/19/18 2030    Cathren LaineSteinl, Kevin, MD 08/20/18 1328

## 2018-08-19 NOTE — Telephone Encounter (Signed)
That is much higher than it should be.  They should follow up with PCP for med changes. Thanks.

## 2018-08-19 NOTE — Telephone Encounter (Signed)
Casey from [place] at [facility] called stating patient BP elevated today 172/110 and then 172/98. Wife called also stating patient BP is 172/120. Please advise.

## 2018-08-19 NOTE — ED Notes (Signed)
Patient verbalizes understanding of discharge instructions. Opportunity for questioning and answers were provided. Armband removed by staff, pt discharged from ED in wheelchair.  

## 2018-08-19 NOTE — ED Notes (Signed)
Called EMS dispatch, they are checking the truck for medicine bag. They will call back

## 2018-08-19 NOTE — ED Notes (Signed)
Pt did not bring medicine bag with him onto truck. Only glasses and shoes were taken from home

## 2018-08-20 ENCOUNTER — Inpatient Hospital Stay: Payer: Medicare PPO | Admitting: Adult Health

## 2018-08-20 NOTE — Telephone Encounter (Signed)
Called Joe Wood back and relayed message from dr Posey Pronto

## 2018-08-21 DIAGNOSIS — F1010 Alcohol abuse, uncomplicated: Secondary | ICD-10-CM

## 2018-08-21 DIAGNOSIS — G479 Sleep disorder, unspecified: Secondary | ICD-10-CM

## 2018-08-21 DIAGNOSIS — I10 Essential (primary) hypertension: Secondary | ICD-10-CM

## 2018-08-21 DIAGNOSIS — E119 Type 2 diabetes mellitus without complications: Secondary | ICD-10-CM

## 2018-08-21 DIAGNOSIS — E785 Hyperlipidemia, unspecified: Secondary | ICD-10-CM

## 2018-08-21 DIAGNOSIS — I69351 Hemiplegia and hemiparesis following cerebral infarction affecting right dominant side: Secondary | ICD-10-CM

## 2018-08-21 DIAGNOSIS — I69322 Dysarthria following cerebral infarction: Secondary | ICD-10-CM

## 2018-08-21 DIAGNOSIS — I69392 Facial weakness following cerebral infarction: Secondary | ICD-10-CM

## 2018-08-21 DIAGNOSIS — Z7984 Long term (current) use of oral hypoglycemic drugs: Secondary | ICD-10-CM

## 2018-08-22 ENCOUNTER — Other Ambulatory Visit: Payer: Self-pay

## 2018-08-22 ENCOUNTER — Encounter: Payer: Self-pay | Admitting: Physical Medicine & Rehabilitation

## 2018-08-22 ENCOUNTER — Encounter: Payer: Medicare Other | Attending: Physical Medicine & Rehabilitation | Admitting: Physical Medicine & Rehabilitation

## 2018-08-22 VITALS — BP 146/87 | HR 73 | Temp 98.7°F | Ht 65.0 in | Wt 164.0 lb

## 2018-08-22 DIAGNOSIS — I10 Essential (primary) hypertension: Secondary | ICD-10-CM

## 2018-08-22 DIAGNOSIS — I69359 Hemiplegia and hemiparesis following cerebral infarction affecting unspecified side: Secondary | ICD-10-CM | POA: Diagnosis present

## 2018-08-22 DIAGNOSIS — E119 Type 2 diabetes mellitus without complications: Secondary | ICD-10-CM | POA: Diagnosis present

## 2018-08-22 DIAGNOSIS — G479 Sleep disorder, unspecified: Secondary | ICD-10-CM | POA: Insufficient documentation

## 2018-08-22 DIAGNOSIS — R269 Unspecified abnormalities of gait and mobility: Secondary | ICD-10-CM | POA: Insufficient documentation

## 2018-08-22 MED ORDER — METOPROLOL TARTRATE 37.5 MG PO TABS: 37.5000 mg | ORAL_TABLET | Freq: Two times a day (BID) | ORAL | 1 refills | Status: AC

## 2018-08-22 MED ORDER — METFORMIN HCL 500 MG PO TABS: 500.0000 mg | ORAL_TABLET | Freq: Two times a day (BID) | ORAL | 1 refills | Status: AC

## 2018-08-22 MED ORDER — AMLODIPINE BESYLATE 10 MG PO TABS: 10.0000 mg | ORAL_TABLET | Freq: Every day | ORAL | 0 refills | Status: DC

## 2018-08-22 MED ORDER — MELATONIN 3 MG PO TABS: 3.0000 mg | ORAL_TABLET | Freq: Every day | ORAL | 0 refills | Status: DC

## 2018-08-22 NOTE — Progress Notes (Addendum)
Subjective:    Patient ID: Joe Wood, male    DOB: 12/15/1950, 68 y.o.   MRN: [id number]  HPI Joe Wood is a right-handed male history of hypertension presents for follow up for bilateral punctate infarcts with largest infarct in left leftBG/CR.   Last clinic visit 07/11/2018.  Wife provides history. Since that time, communication exchanged with patient and wife and therapist regarding elevated BP.  Patient ultimately went to the ED, notes reviewed for the same. BP improved in office, however, wife states elevated at home. He does not have PCP.  He does have a Development worker, international aidCardiologist, but has not followed up. He rescheduled Neuro appointment. He has not been checking his sugars. Sleep is improving with Trazodone. Denies falls. Continues to have therapies.   Pain Inventory Average Pain 0 Pain Right Now 0 My pain is na  In the last 24 hours, has pain interfered with the following? General activity 0 Relation with others 0 Enjoyment of life 0 What TIME of day is your pain at its worst? na Sleep (in general) Poor  Pain is worse with: na Pain improves with: na Relief from Meds: na  Mobility walk with assistance use a walker how many minutes can you walk? 10 ability to climb steps?  no do you drive?  no use a wheelchair  Function not employed: date last employed na I need assistance with the following:  dressing, bathing and toileting  Neuro/Psych numbness trouble walking confusion  Prior Studies Any changes since last visit?  no  Physicians involved in your care Any changes since last visit?  no   Family History  Problem Relation Age of Onset  . Cancer Father    Social History   Socioeconomic History  . Marital status: Married    Spouse name: Not on file  . Number of children: Not on file  . Years of education: Not on file  . Highest education level: Not on file  Occupational History  . Not on file  Social Needs  . Financial resource strain: Not on file   . Food insecurity    Worry: Not on file    Inability: Not on file  . Transportation needs    Medical: Not on file    Non-medical: Not on file  Tobacco Use  . Smoking status: Never Smoker  . Smokeless tobacco: Never Used  Substance and Sexual Activity  . Alcohol use: Yes    Alcohol/week: 0.0 standard drinks  . Drug use: No  . Sexual activity: Not on file  Lifestyle  . Physical activity    Days per week: Not on file    Minutes per session: Not on file  . Stress: Not on file  Relationships  . Social Musicianconnections    Talks on phone: Not on file    Gets together: Not on file    Attends religious service: Not on file    Active member of club or organization: Not on file    Attends meetings of clubs or organizations: Not on file    Relationship status: Not on file  Other Topics Concern  . Not on file  Social History Narrative  . Not on file   Past Surgical History:  Procedure Laterality Date  . IR ANGIO INTRA EXTRACRAN SEL COM CAROTID INNOMINATE BILAT MOD SED  06/02/2018  . IR ANGIO VERTEBRAL SEL SUBCLAVIAN INNOMINATE UNI R MOD SED  06/02/2018  . IR ANGIOGRAM EXTREMITY LEFT  06/02/2018   Past Medical History:  Diagnosis Date  . Hypertension    BP (!) 146/87   Pulse 73   Temp 98.7 F (37.1 C)   Ht 5\' 5"  (1.651 m)   Wt 164 lb (74.4 kg)   SpO2 96%   BMI 27.29 kg/m   Opioid Risk Score:   Fall Risk Score:  `1  Depression screen PHQ 2/9  Depression screen [facility] 2/9 07/11/2018 07/17/2014  Decreased Interest 1 0  Down, Depressed, Hopeless 0 0  PHQ - 2 Score 1 0  Altered sleeping 3 -  Tired, decreased energy 1 -  Change in appetite 0 -  Feeling bad or failure about yourself  0 -  Trouble concentrating 2 -  Moving slowly or fidgety/restless 2 -  Suicidal thoughts 0 -  PHQ-9 Score 9 -    Review of Systems  Constitutional: Negative.   HENT: Negative.   Eyes: Negative.   Respiratory: Negative.   Cardiovascular: Negative.   Gastrointestinal: Negative.   Endocrine:  Negative.   Genitourinary: Positive for urgency.  Musculoskeletal: Positive for gait problem.  Skin: Negative.   Allergic/Immunologic: Negative.   Neurological: Positive for weakness and numbness.  Hematological: Negative.   Psychiatric/Behavioral: Negative.   All other systems reviewed and are negative.      Objective:   Physical Exam Constitutional: No distress . Vital signs reviewed. HENT: Normocephalic.  Atraumatic. Eyes: EOMI. No discharge.   Cardiovascular: No JVD. Respiratory: Normal effort. GI: Non-distended. Musc: No edema or tenderness in extremities. Gait: WNL Neurological:  Alert  Makes eye contact with examiner.  Follows commands Motor:  RUE: Shoulder abduction 4-4+/5, distally 4-4+/5 with apraxia, improving Right lower extremity: Hip flexion, knee extension 4/5, ankle dorsiflexion 4-/5 with apraxia Skin: Warm and dry.  Intact. Psych: Normal mood.  Normal behavior.    Assessment & Plan:  Joe  Joe Wood is a right-handed male history of hypertension presents for follow up for bilateral punctate infarcts with largest infarct in left leftBG/CR.   1.  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  Status post TPA.    Cont therapies  Follow up with Neuro- appointment rescheduled  Follow up with Cards/PCP - needs appointment with both, needs to establish with PCP  Cont ASA Plavix x3 months, then Plavix  2. Hypertension   Cont meds  Remains elevated at home per wife, recent visit to ED.  Follow up with Cards/PCP  Amlodipine/Metoprolol ordered  3 New findings type 2 diabetes mellitus.    Check CBGs, reminded again  Encouraged follow up with PCP  Metformin ordered  4. Sleep disturbance  Cont Melatonin  Cont trazodone 25 daily  5. Gait abnormality  Cont walker for safety  Cont therapies

## 2018-08-26 ENCOUNTER — Encounter: Payer: Self-pay | Admitting: Neurology

## 2018-08-26 ENCOUNTER — Other Ambulatory Visit: Payer: Self-pay

## 2018-08-26 NOTE — Patient Outreach (Signed)
First attempt to obtain mRs. No answer. Left message for return call.  

## 2018-08-29 ENCOUNTER — Telehealth: Payer: Self-pay | Admitting: *Deleted

## 2018-08-29 NOTE — Telephone Encounter (Signed)
Melissa RNCM from [facility] called and said after team conference on Mr Penrose it is felt he would benefit from SN to go out and do teaching on medications and diet as he is HTN and they are not sure he is taking medication and his wife is preparing high sodium food.  Also they have some financial assistance needs and would like a MSW visit to assist with community resources. Please advise.

## 2018-08-29 NOTE — Telephone Encounter (Signed)
[facility] notified.

## 2018-08-29 NOTE — Telephone Encounter (Signed)
We can make the referrals.  Thanks.

## 2018-09-02 ENCOUNTER — Other Ambulatory Visit: Payer: Self-pay

## 2018-09-02 ENCOUNTER — Telehealth: Payer: Self-pay

## 2018-09-02 NOTE — Telephone Encounter (Signed)
[place] OT [facility] [phone] called requesting verbal orders for occupational therapy for 2xwk X 2wks followed by 1xwk X 3wks.  Called her back and approved verbal orders.

## 2018-09-02 NOTE — Patient Outreach (Signed)
First attempt to obtain mRs. No answer. Left message for return call.  

## 2018-09-08 DIAGNOSIS — E119 Type 2 diabetes mellitus without complications: Secondary | ICD-10-CM | POA: Diagnosis not present

## 2018-09-08 DIAGNOSIS — I10 Essential (primary) hypertension: Secondary | ICD-10-CM

## 2018-09-08 DIAGNOSIS — Z7984 Long term (current) use of oral hypoglycemic drugs: Secondary | ICD-10-CM

## 2018-09-08 DIAGNOSIS — F1010 Alcohol abuse, uncomplicated: Secondary | ICD-10-CM

## 2018-09-08 DIAGNOSIS — G479 Sleep disorder, unspecified: Secondary | ICD-10-CM

## 2018-09-08 DIAGNOSIS — I69351 Hemiplegia and hemiparesis following cerebral infarction affecting right dominant side: Secondary | ICD-10-CM

## 2018-09-08 DIAGNOSIS — I69322 Dysarthria following cerebral infarction: Secondary | ICD-10-CM | POA: Diagnosis not present

## 2018-09-08 DIAGNOSIS — I69392 Facial weakness following cerebral infarction: Secondary | ICD-10-CM

## 2018-09-08 DIAGNOSIS — E785 Hyperlipidemia, unspecified: Secondary | ICD-10-CM

## 2018-09-09 ENCOUNTER — Encounter: Payer: Self-pay | Admitting: Adult Health

## 2018-09-09 ENCOUNTER — Ambulatory Visit (INDEPENDENT_AMBULATORY_CARE_PROVIDER_SITE_OTHER): Payer: Medicare Other | Admitting: Adult Health

## 2018-09-09 ENCOUNTER — Other Ambulatory Visit: Payer: Self-pay

## 2018-09-09 VITALS — BP 146/80 | HR 64 | Temp 98.1°F | Ht 65.0 in | Wt 163.8 lb

## 2018-09-09 DIAGNOSIS — E119 Type 2 diabetes mellitus without complications: Secondary | ICD-10-CM

## 2018-09-09 DIAGNOSIS — I10 Essential (primary) hypertension: Secondary | ICD-10-CM | POA: Diagnosis not present

## 2018-09-09 DIAGNOSIS — E785 Hyperlipidemia, unspecified: Secondary | ICD-10-CM

## 2018-09-09 DIAGNOSIS — R41841 Cognitive communication deficit: Secondary | ICD-10-CM

## 2018-09-09 DIAGNOSIS — I639 Cerebral infarction, unspecified: Secondary | ICD-10-CM | POA: Diagnosis not present

## 2018-09-09 DIAGNOSIS — R471 Dysarthria and anarthria: Secondary | ICD-10-CM

## 2018-09-09 NOTE — Progress Notes (Signed)
[facility] [address] [place]. [place] [address] [phone]       HOSPITAL FOLLOW UP NOTE  [name] Date of Birth:  12/06/1950 Medical Record Number:  [id number]   Reason for Referral:  hospital stroke follow up    CHIEF COMPLAINT:  Chief Complaint  Patient presents with   Hospitalization Follow-up    Rm 9, Harmon Pier, wife.    Cerebrovascular Accident    doing ok, getting therapy at [facility]     HPI: [name] being seen today for in office hospital follow-up regarding left BG/CR and right punctate infarct status post TPA secondary to large vessel disease on 05/29/2018.  History obtained from patient, wife and chart review. Reviewed all radiology images and labs personally.  Mr.Joe Wood a 68 y.o.malewith history of HTN with possible noncompliance[facility] with R sided weakness and slurred speech w/ SBP 240s.  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  With CT head negative for hemorrhage but did show age-indeterminate left pelvic infarct and CTA head/neck negative for emergent LVO with small basilar, bilateral fetal PCAs with severe stenosis mid BA.  He received IV TPA due to ongoing deficits.  MRI showed left BG/CR and right punctate BG infarcts.  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  2D echo normal EF without cardiac source of embolus identified.  Cerebral angiogram showed severe preocclusive mid basilar artery stenosis, occluded left VA origin and approximately 50% stenosis of left MCA M1.  Neurological worsening on 06/02/2018 following elective cerebral catheter angiogram with repeat MRI showing small cerebral infarcts in right parietal and right cerebellar infarcts not related to mid basilar stenosis.  Hypertensive emergency which stabilized throughout admission and recommended  long-term BP goal 1 30-1 50 given hypoplastic versus chronic arthrosclerotic posterior circulation.  Recommended DAPT for 3 months then Plavix alone.  LDL 119 and recommended continuation of atorvastatin 40 mg daily as he was previously noncompliant.  A1c 6.5 with new diagnosis of DM and recommended follow-up with PCP outpatient for ongoing monitoring/management.  Other stroke risk factors include EtOH use but no prior history of stroke.  Residual deficits of mild dysarthria, right lower facial paralysis and right hemiparesis.  Discharged to CIR for ongoing therapy.  Residual deficits of right hemiparesis, dysarthria and mild short-term memory loss He does endorse improvement with ongoing participation in home PT/OT Currently ambulates with rolling walker without recent falls.  He does use AFO brace with ambulation.  Wife endorses occasional memory difficulties such as forgetting recent conversations or specific dates.  Does endorse improvement.  No prior history of memory difficulties He has continued on DAPT despite recommendation of 56-month duration but denies bleeding or bruising Continues on atorvastatin 40 mg daily without myalgias Blood pressure today 146/80 Denies new or worsening stroke/TIA symptoms    ROS:   14 system review of systems performed and negative with exception of see HPI  PMH:  Past Medical History:  Diagnosis Date   Hypertension     PSH:  Past Surgical History:  Procedure Laterality Date   IR ANGIO INTRA EXTRACRAN SEL COM CAROTID INNOMINATE BILAT MOD SED  06/02/2018   IR ANGIO VERTEBRAL SEL SUBCLAVIAN INNOMINATE UNI R MOD SED  06/02/2018   IR ANGIOGRAM EXTREMITY LEFT  06/02/2018    Social History:  Social History   Socioeconomic History  Marital status: Married    Spouse name: Not on file   Number of children: Not on file   Years of education: Not on file   Highest education level: Not on file  Occupational History   Not on file  Social Needs    Financial resource strain: Not on file   Food insecurity    Worry: Not on file    Inability: Not on file   Transportation needs    Medical: Not on file    Non-medical: Not on file  Tobacco Use   Smoking status: Never Smoker   Smokeless tobacco: Never Used  Substance and Sexual Activity   Alcohol use: Yes    Alcohol/week: 0.0 standard drinks   Drug use: No   Sexual activity: Not on file  Lifestyle   Physical activity    Days per week: Not on file    Minutes per session: Not on file   Stress: Not on file  Relationships   Social connections    Talks on phone: Not on file    Gets together: Not on file    Attends religious service: Not on file    Active member of club or organization: Not on file    Attends meetings of clubs or organizations: Not on file    Relationship status: Not on file   Intimate partner violence    Fear of current or ex partner: Not on file    Emotionally abused: Not on file    Physically abused: Not on file    Forced sexual activity: Not on file  Other Topics Concern   Not on file  Social History Narrative   Not on file    Family History:  Family History  Problem Relation Age of Onset   Cancer Father     Medications:   Current Outpatient Medications on File Prior to Visit  Medication Sig Dispense Refill   acetaminophen (TYLENOL) 325 MG tablet Take 2 tablets (650 mg total) by mouth every 4 (four) hours as needed for mild pain (or temp > 37.5 C (99.5 F)).     atorvastatin (LIPITOR) 40 MG tablet Take 1 tablet (40 mg total) by mouth daily at 6 PM. 30 tablet 1   clopidogrel (PLAVIX) 75 MG tablet Take 1 tablet (75 mg total) by mouth daily. 30 tablet 1   hydrochlorothiazide (MICROZIDE) 12.5 MG capsule Take 1 capsule (12.5 mg total) by mouth daily. 30 capsule 1   Melatonin 3 MG TABS Take 1 tablet (3 mg total) by mouth at bedtime. 30 tablet 0   metFORMIN (GLUCOPHAGE) 500 MG tablet Take 1 tablet (500 mg total) by mouth 2 (two) times  daily with a meal. 60 tablet 1   Metoprolol Tartrate 37.5 MG TABS Take 37.5 mg by mouth 2 (two) times daily. 60 tablet 1   No current facility-administered medications on file prior to visit.     Allergies:  No Known Allergies   Physical Exam  Vitals:   09/09/18 1248  BP: (!) 146/80  Pulse: 64  Temp: 98.1 F (36.7 C)  SpO2: 97%  Weight: 163 lb 12.8 oz (74.3 kg)  Height: 5\' 5"  (1.651 m)   Body mass index is 27.26 kg/m. No exam data present  Depression screen [facility] 2/9 09/09/2018  Decreased Interest 1  Down, Depressed, Hopeless 0  PHQ - 2 Score 1  Altered sleeping -  Tired, decreased energy -  Change in appetite -  Feeling bad or failure about yourself  -  Trouble concentrating -  Moving slowly or fidgety/restless -  Suicidal thoughts -  PHQ-9 Score -     General: well developed, well nourished,  pleasant middle-aged African-American male, seated, in no evident distress Head: head normocephalic and atraumatic.   Neck: supple with no carotid or supraclavicular bruits Cardiovascular: regular rate and rhythm, no murmurs Musculoskeletal: no deformity Skin:  no rash/petichiae Vascular:  Normal pulses all extremities   Neurologic Exam Mental Status: Awake and fully alert.   Mild dysarthria oriented to place and time. Recent and remote memory subjectively diminished.  Attention span, concentration and fund of knowledge subjectively diminished but appropriate during visit. Mood and affect appropriate.  Cranial Nerves: Fundoscopic exam reveals sharp disc margins. Pupils equal, briskly reactive to light. Extraocular movements full without nystagmus. Visual fields full to confrontation. Hearing intact. Facial sensation intact. Face, tongue, palate moves normally and symmetrically.  Motor: Normal bulk and tone.  RUE: 4+/5 with weak grip strength; RLE: 4/5 within hip flexor and weak ankle dorsiflexion.  AFO brace appropriately in place; full strength left upper and lower  extremity Sensory.: intact to touch , pinprick , position and vibratory sensation.  Coordination: Rapid alternating movements normal in all extremities except diminished right finger tapping. Finger-to-nose and heel-to-shin performed accurately bilaterally on left side with dysmetria noted RUE. Gait and Station: Arises from chair with mild difficulty. Stance is normal. Gait demonstrates  mild hemiplegic gait Reflexes: 1+ and symmetric. Toes downgoing.     NIHSS  2 Modified Rankin  2    Diagnostic Data (Labs, Imaging, Testing)  Ct Head Code Stroke Wo Contrast 05/29/2018 1. Age-indeterminate lacunar infarct in the left thalamus.  2. Negative for acute hemorrhage.  3. Motion degraded.     Ct Angio Head W Or Wo Contrast Ct Angio Neck W Or Wo Contrast 05/29/2018 1. No emergent large vessel occlusion.  2. Small basilar in the setting of bilateral fetal type PCA with severe stenosis of the mid basilar giving an apparent flow gap. No flow is seen within the left vertebral artery until the distal V4 segment.  3. Cervical and intracranial carotid atherosclerosis without flow limiting stenosis.   Mr Maxine GlennMra Head Wo Contrast 05/30/2018 1. Acute perforator infarct in the left corona radiata.  2.Acute lacunar infarct in the right putamen.  3. Background of chronic small vessel disease withremote lacunar infarcts. 4. Intracranial MRA is stable from CTA yesterday. 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   Cerebral Angio 06/02/2018 1.Severe pre occlusive midbasilar artery stenosis. 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 3.Approx 50 % stenosis of LT MCA M 1  Ct Head Wo Contrast 06/02/2018 Chronic changes as described. Similar appearance to priors. No acute intracranial findings.   Mr Brain Wo Contrast 06/02/2018 1. Punctate acute right parietal and right cerebellar infarcts. 2. Unchanged acute left larger than right basal ganglia region infarcts. 3. Moderate chronic small vessel  ischemic disease.      ASSESSMENT: Joe Wood is a 68 y.o. year old male presented with right-sided weakness and slurred speech on 05/29/2018 with stroke work-up revealing left BG/CR and right punctate BG infarct status post TPA secondary to large vessel disease with left MCA 50% stenosis. 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  Residual deficits of RLE weakness, RUE dysmetria, dysarthria and mild short-term memory impairment    PLAN:  1. Left BG/CR  and right punctate BG infarcts: Continue clopidogrel 75 mg daily  and atorvastatin for secondary stroke prevention.  Advised to discontinue aspirin at this time as 3432-month DAPT completed.  Maintain strict control of hypertension with blood pressure goal below 130/90, diabetes with hemoglobin A1c goal below 6.5% and cholesterol with LDL cholesterol (bad cholesterol) goal below 70 mg/dL.  I also advised the patient to eat a healthy diet with plenty of whole grains, cereals, fruits and vegetables, exercise regularly with at least 30 minutes of continuous activity daily and maintain ideal body weight. 2. Residual deficits: Ongoing participation in home health therapies with recommendation of initiating speech therapy.  May consider additional outpatient therapy once home therapies completed 3. HTN: Advised to continue current treatment regimen.  Today's BP stable.  Advised to continue to monitor at home along with continued follow-up with PCP for management 4. HLD: Advised to continue current treatment regimen along with continued follow-up with PCP for future prescribing and monitoring of lipid panel 5. DMII: Advised to continue to monitor glucose levels at home along with continued follow-up with PCP for management and monitoring    Follow up in 3 months or call earlier if needed   Greater  than 50% of time during this 45 minute visit was spent on counseling, explanation of diagnosis of recent stroke, reviewing risk factor management of HTN, HLD and DM, planning of further management along with potential future management, and discussion with patient and family answering all questions.    Ihor AustinJessica McCue, AGNP-BC  [facility] [address] [place], [place] [id number]  Phone [phone] Fax [phone] Note: This document was prepared with digital dictation and possible smart phrase technology. Any transcriptional errors that result from this process are unintentional.

## 2018-09-09 NOTE — Patient Instructions (Signed)
Continue clopidogrel 75 mg daily  and Lipitor for secondary stroke prevention  Discontinue aspirin as 3 months aspirin and Plavix combination completed  Continue to follow up with PCP regarding cholesterol, blood pressure and diabetes management   Continue home physical and Occupational Therapy.  Will request evaluation/treatment by speech therapy as well.  Will likely need additional therapy outpatient once home therapy completed.  Continue to monitor blood pressure at home  Maintain strict control of hypertension with blood pressure goal below 130/90, diabetes with hemoglobin A1c goal below 6.5% and cholesterol with LDL cholesterol (bad cholesterol) goal below 70 mg/dL. I also advised the patient to eat a healthy diet with plenty of whole grains, cereals, fruits and vegetables, exercise regularly and maintain ideal body weight.  Followup in the future with me in 3 months or call earlier if needed       Thank you for coming to see [place] at [facility]. I hope we have been able to provide you high quality care today.  You may receive a patient satisfaction survey over the next few weeks. We would appreciate your feedback and comments so that we may continue to improve ourselves and the health of our patients.

## 2018-09-10 NOTE — Progress Notes (Signed)
I agree with the above plan 

## 2018-09-12 ENCOUNTER — Other Ambulatory Visit: Payer: Self-pay

## 2018-09-12 NOTE — Patient Outreach (Signed)
Second attempt to obtain mRs. No answer. Left message for return call.  

## 2018-09-14 ENCOUNTER — Other Ambulatory Visit: Payer: Self-pay | Admitting: Physical Medicine & Rehabilitation

## 2018-09-16 ENCOUNTER — Encounter: Payer: Medicare Other | Admitting: Physical Medicine & Rehabilitation

## 2018-09-16 ENCOUNTER — Other Ambulatory Visit: Payer: Self-pay

## 2018-09-16 ENCOUNTER — Telehealth (HOSPITAL_COMMUNITY): Payer: Self-pay | Admitting: Radiology

## 2018-09-16 NOTE — Patient Outreach (Signed)
Telephone outreach to patient to obtain mRs was successfully completed. mRs= 8. Spoke with wife per DPR to obtain score.

## 2018-09-17 ENCOUNTER — Other Ambulatory Visit (HOSPITAL_COMMUNITY): Payer: Self-pay | Admitting: Interventional Radiology

## 2018-09-17 DIAGNOSIS — I651 Occlusion and stenosis of basilar artery: Secondary | ICD-10-CM

## 2018-09-19 ENCOUNTER — Telehealth: Payer: Self-pay

## 2018-09-19 NOTE — Telephone Encounter (Signed)
Anna Genre, ST from [facility] called requesting verbal orders on HHST 1wk5 for cognition. Called Anna Genre back and she states Neurologist initiated another order for ST.

## 2018-09-22 NOTE — Telephone Encounter (Signed)
Thank you :)

## 2018-09-23 ENCOUNTER — Encounter: Payer: Medicare Other | Admitting: Physical Medicine & Rehabilitation

## 2018-09-23 ENCOUNTER — Telehealth: Payer: Self-pay | Admitting: Physical Medicine & Rehabilitation

## 2018-09-23 NOTE — Telephone Encounter (Signed)
Orders approved and given for continuation

## 2018-09-23 NOTE — Telephone Encounter (Signed)
Is this okay?

## 2018-09-23 NOTE — Telephone Encounter (Signed)
[place] OT with [facility] needs to get verbal to see patient 1w4.  Please call her at [phone].

## 2018-09-23 NOTE — Telephone Encounter (Signed)
It sounds like Neurology is ordering.  Further he did not show up for follow up appointment.  Thanks.

## 2018-09-25 ENCOUNTER — Ambulatory Visit: Payer: Medicare Other | Admitting: Physical Medicine & Rehabilitation

## 2018-09-26 ENCOUNTER — Telehealth: Payer: Self-pay | Admitting: Adult Health

## 2018-09-26 NOTE — Telephone Encounter (Signed)
Joe Wood the speech therapist called wanting VO for once a week for 4 weeks to address cognition. Please advise.

## 2018-09-30 ENCOUNTER — Ambulatory Visit (HOSPITAL_COMMUNITY): Admission: RE | Admit: 2018-09-30 | Payer: Medicare Other | Source: Ambulatory Visit

## 2018-09-30 NOTE — Telephone Encounter (Signed)
I gave VO to ST, [place], ok for once week for 4 wks, (relating to cognition).   She verbalized understanding.

## 2018-10-21 ENCOUNTER — Telehealth: Payer: Self-pay

## 2018-10-21 NOTE — Telephone Encounter (Signed)
Anna Genre, ST from [facility] called to report patients BP 151/98.

## 2018-10-22 ENCOUNTER — Ambulatory Visit (HOSPITAL_COMMUNITY)
Admission: RE | Admit: 2018-10-22 | Discharge: 2018-10-22 | Disposition: A | Discharge status: Home / Self Care | Payer: Medicare Other | Source: Ambulatory Visit | Attending: Interventional Radiology | Admitting: Interventional Radiology

## 2018-10-22 ENCOUNTER — Other Ambulatory Visit: Payer: Self-pay

## 2018-10-22 DIAGNOSIS — I651 Occlusion and stenosis of basilar artery: Secondary | ICD-10-CM

## 2018-10-22 NOTE — Progress Notes (Signed)
Chief Complaint: Patient was seen in consultation today for mid basilar artery pre-occlusive stenosis.  Referring Physician(s): Marvel PlanXu, Jindong  Supervising Physician: Julieanne Cottoneveshwar, Sanjeev  [facility]  History of Present Illness: Joe Wood is a 68 y.o. male with a past medical history as below, with pertinent past medical history including hypertension, hyperlipidemia, CVA 05/2018, diabetes mellitus type II, and alcohol abuse. [facility] and has been followed by Dr. Corliss Skainseveshwar since 05/2018. He first presented to our department at the request of Dr. Roda ShuttersXu for management of multiple areas of intracranial stenosis. [facility] to 06/05/2018 for management of acute CVA (basal ganglia infarct). He underwent an image-guided diagnostic cerebral arteriogram 06/02/2018 by Dr. Corliss Skainseveshwar which revealed severe pre-occlusive mid basilar artery stenosis, occluded left VA, and proximal left MCA M1 segment stenosis. [facility] and discharged home 06/27/2018 on DAPT (Plavix and Aspirin x3 months, then just Plavix alone) with neurology and [facility] outpatient follow-up.  Diagnostic cerebral arteriogram 06/02/2018: 1. Severe pre occlusive stenosis of the mid basilar artery. 2. 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 3. 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  Patient presents today for follow-up to discuss findings of recent diagnostic cerebral arteriogram 06/02/2018. Patient awake and alert sitting in wheelchair. Accompanied by wife. Complains of right-sided weakness, improved since discharge from [facility]. Wife states that he mostly needs assistance with walking- states he uses a walker for ambulation without assistance, but cannot walk on his own (without walker). Wife states he has home PT which is helping him. Wife complains of urinary  frequency, states "he pees all the time". Wife complains of memory loss since stroke, states that she sometimes has to explain things to him on a daily basis (example- how to work the TV remote). Denies headache, numbness/tingling, dizziness, vision changes, hearing changes, tinnitus, or speech difficulty.  Currently taking Plavix 75 mg once daily.   Past Medical History:  Diagnosis Date  . Hypertension     Past Surgical History:  Procedure Laterality Date  . IR ANGIO INTRA EXTRACRAN SEL COM CAROTID INNOMINATE BILAT MOD SED  06/02/2018  . IR ANGIO VERTEBRAL SEL SUBCLAVIAN INNOMINATE UNI R MOD SED  06/02/2018  . IR ANGIOGRAM EXTREMITY LEFT  06/02/2018    Allergies: Patient has no known allergies.  Medications: Prior to Admission medications   Medication Sig Start Date End Date Taking? Authorizing Provider  acetaminophen (TYLENOL) 325 MG tablet Take 2 tablets (650 mg total) by mouth every 4 (four) hours as needed for mild pain (or temp > 37.5 C (99.5 F)). 06/27/18   Angiulli, Mcarthur Rossettianiel J, PA-C  atorvastatin (LIPITOR) 40 MG tablet Take 1 tablet (40 mg total) by mouth daily at 6 PM. 06/27/18   Angiulli, Mcarthur Rossettianiel J, PA-C  clopidogrel (PLAVIX) 75 MG tablet Take 1 tablet (75 mg total) by mouth daily. 06/27/18   Angiulli, Mcarthur Rossettianiel J, PA-C  [facility] MELATONIN 3 MG TABS TAKE 1 TABLET (3 MG TOTAL) BY MOUTH AT BEDTIME. 09/15/18   Patel, Maryln GottronAnkit Anil, MD  hydrochlorothiazide (MICROZIDE) 12.5 MG capsule Take 1 capsule (12.5 mg total) by mouth daily. 06/27/18   Angiulli, Mcarthur Rossettianiel J, PA-C  metFORMIN (GLUCOPHAGE) 500 MG tablet Take 1 tablet (500 mg total) by mouth 2 (two) times daily with a meal. 08/22/18   Marcello FennelPatel, Ankit Anil, MD  Metoprolol Tartrate 37.5 MG TABS Take 37.5 mg by mouth 2 (two) times daily.  08/22/18   Marcello Fennel, MD     Family History  Problem Relation Age of Onset  . Cancer Father     Social History   Socioeconomic History  . Marital status: Married    Spouse name: Not on file  . Number of  children: Not on file  . Years of education: Not on file  . Highest education level: Not on file  Occupational History  . Not on file  Social Needs  . Financial resource strain: Not on file  . Food insecurity    Worry: Not on file    Inability: Not on file  . Transportation needs    Medical: Not on file    Non-medical: Not on file  Tobacco Use  . Smoking status: Never Smoker  . Smokeless tobacco: Never Used  Substance and Sexual Activity  . Alcohol use: Yes    Alcohol/week: 0.0 standard drinks  . Drug use: No  . Sexual activity: Not on file  Lifestyle  . Physical activity    Days per week: Not on file    Minutes per session: Not on file  . Stress: Not on file  Relationships  . Social Musician on phone: Not on file    Gets together: Not on file    Attends religious service: Not on file    Active member of club or organization: Not on file    Attends meetings of clubs or organizations: Not on file    Relationship status: Not on file  Other Topics Concern  . Not on file  Social History Narrative  . Not on file     Review of Systems: A 12 point ROS discussed and pertinent positives are indicated in the HPI above.  All other systems are negative.  Review of Systems  Constitutional: Negative for chills and fever.  HENT: Negative for hearing loss and tinnitus.   Eyes: Negative for visual disturbance.  Respiratory: Negative for shortness of breath and wheezing.   Cardiovascular: Negative for chest pain and palpitations.  Genitourinary: Positive for frequency.  Neurological: Positive for weakness. Negative for dizziness, speech difficulty, numbness and headaches.  Psychiatric/Behavioral: Negative for behavioral problems and confusion.    Physical Exam Constitutional:      General: He is not in acute distress.    Appearance: Normal appearance.  Pulmonary:     Effort: Pulmonary effort is normal. No respiratory distress.  Skin:    General: Skin is warm and  dry.  Neurological:     Mental Status: He is alert and oriented to person, place, and time.  Psychiatric:        Mood and Affect: Mood normal.        Behavior: Behavior normal.        Thought Content: Thought content normal.        Judgment: Judgment normal.      Imaging: No results found.  Labs:  CBC: Recent Labs    06/02/18 0521 06/05/18 0500 06/06/18 0247 06/19/18 0511  WBC 7.3 6.5 7.8 6.7  HGB 14.7 14.1 14.7 13.2  HCT 43.8 40.7 42.4 37.5*  PLT 283 319 363 354    COAGS: Recent Labs    05/29/18 0606  INR 1.0  APTT 32    BMP: Recent Labs    06/05/18 0500 06/06/18 0247 06/11/18 0515 06/12/18 0026 06/19/18 0511 06/26/18 0018  NA 136 134* 134*  --  133*  --   K 3.5 4.1 4.2  --  4.0  --   CL 100 100 96*  --  99  --   CO2 23 25 26   --  24  --   GLUCOSE 162* 157* 186*  --  169*  --   BUN 13 14 19   --  21  --   CALCIUM 9.8 9.6 10.3  --  10.0  --   CREATININE 1.00 1.07 1.14 1.21 1.18 1.03  GFRNONAA >60 >60 >60 >60 >60 >60  GFRAA >60 >60 >60 >60 >60 >60    LIVER FUNCTION TESTS: Recent Labs    05/29/18 0606 06/06/18 0247  BILITOT 0.9 2.0*  AST 20 31  ALT 23 40  ALKPHOS 61 60  PROT 7.2 6.9  ALBUMIN 4.2 3.8     Assessment and Plan:  Mid basilar artery pre-occlusive stenosis. Dr. Estanislado Pandy was present for consultation. Discussed patient's ADLs since CVA. Patient's wife states that he is doing much better since discharge from CIR. States that he only needs assistance bathing. States he ambulates with walker, no assistance. Overall, patient has clinically improved on medical management. Recommend continued conservative medical management- continue taking Plavix, follow-up with routine imaging scans to monitor for changes (6 months since recent procedure).  Secondary stroke prevention- advised patient to take his anti-hypertensive and diabetic medications as prescribed and to follow-up with PCP regularly regarding hypertension and diabetes mellitus  management. Advised patient to follow-up with his neurologist regularly.  Advised patient to follow-up with PCP regarding management of urinary frequency.  Plan for follow-up with CTA head/neck (with contrast) 6 months from procedure 06/02/2018. Informed patient that our schedulers will call him to set up this imaging scan. Instructed patient to continue taking Plavix 75 mg once daily.  All questions answered and concerns addressed. Patient conveys understanding and agrees with plan.  Thank you for this interesting consult.  I greatly enjoyed meeting Hikeem E Delduca and look forward to participating in their care.  A copy of this report was sent to the requesting provider on this date.  Electronically Signed: Earley Abide, PA-C 10/22/2018, 9:57 AM   I spent a total of 40 Minutes in face to face in clinical consultation, greater than 50% of which was counseling/coordinating care for mid basilar artery pre-occlusive stenosis.

## 2018-10-27 NOTE — Telephone Encounter (Signed)
Thank you, patient was instructed to follow up with his PCP or Cards on last visit.  He was given a one time prescription for BP meds.  Thanks.

## 2018-11-06 ENCOUNTER — Encounter: Payer: Medicare Other | Admitting: Physical Medicine & Rehabilitation

## 2018-11-20 ENCOUNTER — Telehealth (HOSPITAL_COMMUNITY): Payer: Self-pay

## 2018-11-20 NOTE — Telephone Encounter (Signed)
Called to schedule f/u cta head/neck, no answer, left vm. AW 

## 2018-11-21 ENCOUNTER — Encounter: Payer: Medicare Other | Attending: Physical Medicine & Rehabilitation | Admitting: Physical Medicine & Rehabilitation

## 2018-11-21 ENCOUNTER — Encounter: Payer: Self-pay | Admitting: Physical Medicine & Rehabilitation

## 2018-11-21 ENCOUNTER — Other Ambulatory Visit: Payer: Self-pay

## 2018-11-21 VITALS — BP 145/93 | HR 73 | Temp 97.5°F | Resp 14 | Ht 66.0 in | Wt 158.0 lb

## 2018-11-21 DIAGNOSIS — I69359 Hemiplegia and hemiparesis following cerebral infarction affecting unspecified side: Secondary | ICD-10-CM | POA: Insufficient documentation

## 2018-11-21 DIAGNOSIS — R269 Unspecified abnormalities of gait and mobility: Secondary | ICD-10-CM

## 2018-11-21 DIAGNOSIS — G479 Sleep disorder, unspecified: Secondary | ICD-10-CM | POA: Insufficient documentation

## 2018-11-21 DIAGNOSIS — E119 Type 2 diabetes mellitus without complications: Secondary | ICD-10-CM | POA: Diagnosis present

## 2018-11-21 DIAGNOSIS — I10 Essential (primary) hypertension: Secondary | ICD-10-CM | POA: Diagnosis present

## 2018-11-21 NOTE — Progress Notes (Signed)
Subjective:    Patient ID: Joe Wood, male    DOB: September 29, 1950, 68 y.o.   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   Last clinic visit 08/22/2018.  Since that time, several communications exchanged regarding medications. Wife supplements history. Patient completed therapies, doing HEP. He is following up with Neuro. He has not followed up with PCP. He is seeing Cards. BP is slightly elevated.  He is now on Plavix alone. He has not checked his CBGs. Denies falls. Wife noted urinary difficulties, but improved with timed toileting.   Pain Inventory Average Pain 0 Pain Right Now 0 My pain is na  In the last 24 hours, has pain interfered with the following? General activity 0 Relation with others 0 Enjoyment of life 0 What TIME of day is your pain at its worst? na Sleep (in general) Good  Pain is worse with: na Pain improves with: na Relief from Meds: na  Mobility walk with assistance use a walker how many minutes can you walk? 10 ability to climb steps?  no do you drive?  no use a wheelchair  Function not employed: date last employed na I need assistance with the following:  dressing, bathing and toileting  Neuro/Psych numbness trouble walking confusion  Prior Studies Any changes since last visit?  no  Physicians involved in your care Any changes since last visit?  no   Family History  Problem Relation Age of Onset  . Cancer Father    Social History   Socioeconomic History  . Marital status: Married    Spouse name: Not on file  . Number of children: Not on file  . Years of education: Not on file  . Highest education level: Not on file  Occupational History  . Not on file  Social Needs  . Financial resource strain: Not on file  . Food insecurity    Worry: Not on file    Inability: Not on file  . Transportation  needs    Medical: Not on file    Non-medical: Not on file  Tobacco Use  . Smoking status: Never Smoker  . Smokeless tobacco: Never Used  Substance and Sexual Activity  . Alcohol use: Yes    Alcohol/week: 0.0 standard drinks  . Drug use: No  . Sexual activity: Not on file  Lifestyle  . Physical activity    Days per week: Not on file    Minutes per session: Not on file  . Stress: Not on file  Relationships  . Social Herbalist on phone: Not on file    Gets together: Not on file    Attends religious service: Not on file    Active member of club or organization: Not on file    Attends meetings of clubs or organizations: Not on file    Relationship status: Not on file  Other Topics Concern  . Not on file  Social History Narrative  . Not on file   Past Surgical History:  Procedure Laterality Date  . IR ANGIO INTRA EXTRACRAN SEL COM CAROTID INNOMINATE BILAT MOD SED  06/02/2018  . IR ANGIO VERTEBRAL SEL SUBCLAVIAN INNOMINATE UNI R MOD SED  06/02/2018  . IR ANGIOGRAM EXTREMITY LEFT  06/02/2018   Past Medical History:  Diagnosis Date  . Hypertension    BP (!) 145/93   Pulse 73  Temp (!) 97.5 F (36.4 C)   Resp 14   Ht 5\' 6"  (1.676 m)   Wt 158 lb (71.7 kg)   SpO2 98%   BMI 25.50 kg/m   Opioid Risk Score:   Fall Risk Score:  `1  Depression screen PHQ 2/9  Depression screen [facility] 2/9 09/09/2018 07/11/2018 07/17/2014  Decreased Interest 1 1 0  Down, Depressed, Hopeless 0 0 0  PHQ - 2 Score 1 1 0  Altered sleeping - 3 -  Tired, decreased energy - 1 -  Change in appetite - 0 -  Feeling bad or failure about yourself  - 0 -  Trouble concentrating - 2 -  Moving slowly or fidgety/restless - 2 -  Suicidal thoughts - 0 -  PHQ-9 Score - 9 -    Review of Systems  Constitutional: Negative.   HENT: Negative.   Eyes: Negative.   Respiratory: Negative.   Cardiovascular: Negative.   Gastrointestinal: Negative.   Endocrine: Negative.   Genitourinary: Positive for  urgency.  Musculoskeletal: Positive for gait problem.  Skin: Negative.   Allergic/Immunologic: Negative.   Neurological: Positive for weakness and numbness.  Hematological: Negative.   Psychiatric/Behavioral: Negative.        Objective:   Physical Exam  Constitutional: No distress . Vital signs reviewed. HENT: Normocephalic.  Atraumatic. Eyes: EOMI. No discharge. Cardiovascular: No JVD. Respiratory: Normal effort.  No stridor. GI: Non-distended. Skin: Warm and dry.  Intact. Psych: Flat. Musc: No edema in extremities.  No tenderness in extremities. 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   1.  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  Status post TPA.    Cont HEP, possibly looking to restart therapies, awaiting therapy follow up   Cont follow up with Neuro  Cont follow up with Cards  Follow up PCP - needs appointment with both, needs to establish with PCP  Cont Plavix alone  2. Hypertension   Cont meds  Relatively controlled at home now per wife  Cont meds  3 New findings type 2 diabetes mellitus.    Check CBGs, reminded again x2  Encouraged follow up with PCP, needs to establish  Cont meds  4. Sleep disturbance  Cont Melatonin  5. Gait abnormality  Cont rollator for safety  Cont HEP  All questions answered

## 2018-12-15 ENCOUNTER — Telehealth (HOSPITAL_COMMUNITY): Payer: Self-pay

## 2018-12-15 ENCOUNTER — Other Ambulatory Visit (HOSPITAL_COMMUNITY): Payer: Self-pay | Admitting: Interventional Radiology

## 2018-12-15 DIAGNOSIS — I651 Occlusion and stenosis of basilar artery: Secondary | ICD-10-CM

## 2018-12-15 NOTE — Telephone Encounter (Signed)
Called to schedule f/u cta, no answer, left vm. AW  

## 2018-12-17 ENCOUNTER — Ambulatory Visit: Payer: Medicare Other | Admitting: Adult Health

## 2018-12-24 ENCOUNTER — Other Ambulatory Visit: Payer: Self-pay

## 2018-12-24 ENCOUNTER — Ambulatory Visit (HOSPITAL_COMMUNITY)
Admission: RE | Admit: 2018-12-24 | Discharge: 2018-12-24 | Disposition: A | Discharge status: Home / Self Care | Payer: Medicare Other | Source: Ambulatory Visit | Attending: Interventional Radiology | Admitting: Interventional Radiology

## 2018-12-24 DIAGNOSIS — I651 Occlusion and stenosis of basilar artery: Secondary | ICD-10-CM

## 2018-12-24 LAB — POCT I-STAT CREATININE: Creatinine, Ser: 0.8 mg/dL (ref 0.61–1.24)

## 2018-12-24 MED ORDER — IOHEXOL 350 MG/ML SOLN
50.0000 mL | Freq: Once | INTRAVENOUS | Status: AC | PRN
  Administered 2018-12-24: 50 mL via INTRAVENOUS

## 2018-12-26 ENCOUNTER — Telehealth (HOSPITAL_COMMUNITY): Payer: Self-pay

## 2018-12-26 NOTE — Telephone Encounter (Signed)
Called pt regarding recent cta, no answer, left vm. AW 

## 2018-12-30 ENCOUNTER — Telehealth (HOSPITAL_COMMUNITY): Payer: Self-pay

## 2018-12-30 NOTE — Telephone Encounter (Signed)
Called pt regarding recent cta, no answer, left vm to f/u in 6 months. AW

## 2018-12-30 NOTE — Telephone Encounter (Signed)
Pt's wife returned call. Agreed to f/u in 6 months with cta head/neck. AW

## 2019-01-21 ENCOUNTER — Encounter: Payer: Self-pay | Admitting: Adult Health

## 2019-01-21 ENCOUNTER — Ambulatory Visit: Payer: Medicare Other | Admitting: Adult Health

## 2019-01-21 ENCOUNTER — Other Ambulatory Visit: Payer: Self-pay

## 2019-01-21 VITALS — BP 144/98 | Temp 97.6°F | Ht 65.0 in | Wt 157.2 lb

## 2019-01-21 DIAGNOSIS — E785 Hyperlipidemia, unspecified: Secondary | ICD-10-CM | POA: Diagnosis not present

## 2019-01-21 DIAGNOSIS — Z8673 Personal history of transient ischemic attack (TIA), and cerebral infarction without residual deficits: Secondary | ICD-10-CM

## 2019-01-21 DIAGNOSIS — I10 Essential (primary) hypertension: Secondary | ICD-10-CM | POA: Diagnosis not present

## 2019-01-21 DIAGNOSIS — E119 Type 2 diabetes mellitus without complications: Secondary | ICD-10-CM | POA: Diagnosis not present

## 2019-01-21 DIAGNOSIS — G8191 Hemiplegia, unspecified affecting right dominant side: Secondary | ICD-10-CM

## 2019-01-21 NOTE — Patient Instructions (Signed)
Continue clopidogrel 75 mg daily  and Lipitor for secondary stroke prevention  Referral will be placed to establish care with PCP who will continue to monitor and manage blood pressure, cholesterol and diabetes  Follow-up with vascular surgery for repeat imaging next year as scheduled  Continue to monitor blood pressure at home  Maintain strict control of hypertension with blood pressure goal below 130/90, diabetes with hemoglobin A1c goal below 6.5% and cholesterol with LDL cholesterol (bad cholesterol) goal below 70 mg/dL. I also advised the patient to eat a healthy diet with plenty of whole grains, cereals, fruits and vegetables, exercise regularly and maintain ideal body weight.  Followup in the future with me in 6 months or call earlier if needed       Thank you for coming to see [place] at [facility]. I hope we have been able to provide you high quality care today.  You may receive a patient satisfaction survey over the next few weeks. We would appreciate your feedback and comments so that we may continue to improve ourselves and the health of our patients.

## 2019-01-21 NOTE — Progress Notes (Signed)
[facility] [address] [place]. [place] [address] [phone]       STROKE FOLLOW UP NOTE  Mr. [name] Date of Birth:  03-19-1950 Medical Record Number:  [id number]   Reason for Referral: stroke follow up    CHIEF COMPLAINT:  Chief Complaint  Patient presents with  . Follow-up    [place] Rm, with wife. No changes. Would like a handicap sticker.    HPI: Stroke admission 05/29/2018: Mr.Joe E Lindsayis a 68 y.o.malewith history of HTN with possible noncompliancepresented to [facility] ED on 05/29/2018 with R sided weakness and slurred speech w/ SBP 240s.  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  With CT head negative for hemorrhage but did show age-indeterminate left pelvic infarct and CTA head/neck negative for emergent LVO with small basilar, bilateral fetal PCAs with severe stenosis mid BA.  He received IV TPA due to ongoing deficits.  MRI showed left BG/CR and right punctate BG infarcts.  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  2D echo normal EF without cardiac source of embolus identified.  Cerebral angiogram showed severe preocclusive mid basilar artery stenosis, occluded left VA origin and approximately 50% stenosis of left MCA M1.  Neurological worsening on 06/02/2018 following elective cerebral catheter angiogram with repeat MRI showing small cerebral infarcts in right parietal and right cerebellar infarcts not related to mid basilar stenosis.  Hypertensive emergency which stabilized throughout admission and recommended long-term BP goal 1 30-1 50 given hypoplastic versus chronic arthrosclerotic posterior circulation.  Recommended DAPT for 3 months then Plavix alone.  LDL 119 and recommended continuation of atorvastatin 40 mg daily as he was previously noncompliant.  A1c 6.5 with new diagnosis of DM and recommended follow-up  with PCP outpatient for ongoing monitoring/management.  Other stroke risk factors include EtOH use but no prior history of stroke.  Residual deficits of mild dysarthria, right lower facial paralysis and right hemiparesis.  Discharged to CIR for ongoing therapy.  Initial visit 09/09/2018: Residual deficits of right hemiparesis, dysarthria and mild short-term memory loss He does endorse improvement with ongoing participation in home PT/OT Currently ambulates with rolling walker without recent falls.  He does use AFO brace with ambulation.  Wife endorses occasional memory difficulties such as forgetting recent conversations or specific dates.  Does endorse improvement.  No prior history of memory difficulties He has continued on DAPT despite recommendation of 35-month duration but denies bleeding or bruising Continues on atorvastatin 40 mg daily without myalgias Blood pressure today 146/80 Denies new or worsening stroke/TIA symptoms  Update 12/25/2018: [name] is a 68 year old male who is being seen today for stroke follow-up accompanied by his wife.  Residual deficits right hemiparesis and right facial droop.  He denies any additional speech difficulty.  He continues to use wheelchair for long distance and will use rolling walker for short distance.  He has difficulty tolerating long distance ambulation due to right hemiparesis.  He continues to use AFO brace due to right foot drop and hyperextending of right knee.  He endorses benefit with use of AFO brace.  Wife is asking for a handicap placard due to patient having difficulty ambulating long distances.  Continues on Plavix and atorvastatin for secondary stroke prevention without side effects.  Blood pressure today 144/98.  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  It was recommended to follow-up with repeat imaging in 6 months time.  Denies new or worsening stroke/TIA symptoms.  No further concerns at this time.      ROS:   14 system review of systems performed and negative with exception of weakness  PMH:  Past Medical History:  Diagnosis Date  . Hypertension     PSH:  Past Surgical History:  Procedure Laterality Date  . IR ANGIO INTRA EXTRACRAN SEL COM CAROTID INNOMINATE BILAT MOD SED  06/02/2018  . IR ANGIO VERTEBRAL SEL SUBCLAVIAN INNOMINATE UNI R MOD SED  06/02/2018  . IR ANGIOGRAM EXTREMITY LEFT  06/02/2018    Social History:  Social History   Socioeconomic History  . Marital status: Married    Spouse name: Not on file  . Number of children: Not on file  . Years of education: Not on file  . Highest education level: Not on file  Occupational History  . Not on file  Tobacco Use  . Smoking status: Never Smoker  . Smokeless tobacco: Never Used  Substance and Sexual Activity  . Alcohol use: Yes    Alcohol/week: 0.0 standard drinks  . Drug use: No  . Sexual activity: Not on file  Other Topics Concern  . Not on file  Social History Narrative  . Not on file   Social Determinants of Health   Financial Resource Strain:   . Difficulty of Paying Living Expenses: Not on file  Food Insecurity:   . Worried About Programme researcher, broadcasting/film/video in the Last Year: Not on file  . Ran Out of Food in the Last Year: Not on file  Transportation Needs:   . Lack of Transportation (Medical): Not on file  . Lack of Transportation (Non-Medical): Not on file  Physical Activity:   . Days of Exercise per Week: Not on file  . Minutes of Exercise per Session: Not on file  Stress:   . Feeling of Stress : Not on file  Social  Connections:   . Frequency of Communication with Friends and Family: Not on file  . Frequency of Social Gatherings with Friends and Family: Not on file  . Attends Religious Services: Not on file  . Active Member of Clubs or Organizations: Not on file  . Attends Banker Meetings: Not on file  . Marital Status: Not on file  Intimate Partner Violence:   . Fear of Current or Ex-Partner: Not on file  . Emotionally Abused: Not on file  . Physically Abused: Not on file  . Sexually Abused: Not on file    Family History:  Family History  Problem Relation Age of Onset  . Cancer Father     Medications:   Current Outpatient Medications on File Prior to Visit  Medication Sig Dispense Refill  . acetaminophen (TYLENOL) 325 MG tablet Take 2 tablets (650 mg total) by mouth every 4 (four) hours as needed for mild pain (or temp > 37.5 C (99.5 F)).    Marland Kitchen atorvastatin (LIPITOR) 40 MG tablet Take 1 tablet (40 mg total) by mouth daily at 6 PM. 30 tablet 1  . clopidogrel (PLAVIX) 75 MG tablet Take 1 tablet (75 mg total) by  mouth daily. 30 tablet 1  . [facility] MELATONIN 3 MG TABS TAKE 1 TABLET (3 MG TOTAL) BY MOUTH AT BEDTIME. 30 tablet 2  . hydrochlorothiazide (MICROZIDE) 12.5 MG capsule Take 1 capsule (12.5 mg total) by mouth daily. 30 capsule 1  . metFORMIN (GLUCOPHAGE) 500 MG tablet Take 1 tablet (500 mg total) by mouth 2 (two) times daily with a meal. 60 tablet 1  . Metoprolol Tartrate 37.5 MG TABS Take 37.5 mg by mouth 2 (two) times daily. 60 tablet 1   No current facility-administered medications on file prior to visit.    Allergies:  No Known Allergies   Physical Exam  Vitals:   01/21/19 1016  BP: (!) 144/98  Temp: 97.6 F (36.4 C)  Weight: 157 lb 3.2 oz (71.3 kg)  Height: 5\' 5"  (1.651 m)   Body mass index is 26.16 kg/m. No exam data present  [facility]2018  Decreased Interest 1  Down, Depressed, Hopeless 0  PHQ - 2 Score 1  Altered sleeping -    Tired, decreased energy -  Change in appetite -  Feeling bad or failure about yourself  -  Trouble concentrating -  Moving slowly or fidgety/restless -  Suicidal thoughts -  PHQ-9 Score -     General: well developed, well nourished,  pleasant middle-aged African-American male, seated, in no evident distress Head: head normocephalic and atraumatic.   Neck: supple with no carotid or supraclavicular bruits Cardiovascular: regular rate and rhythm, no murmurs Musculoskeletal: no deformity Skin:  no rash/petichiae Vascular:  Normal pulses all extremities   Neurologic Exam Mental Status: Awake and fully alert.   Normal speech and language.  Oriented to place and time. Recent and remote memory subjectively diminished.  Attention span, concentration and fund of knowledge subjectively diminished but appropriate during visit. Mood and affect appropriate.  Cranial Nerves: Fundoscopic exam reveals sharp disc margins. Pupils equal, briskly reactive to light. Extraocular movements full without nystagmus. Visual fields full to confrontation. Hearing intact. Facial sensation intact.  Very mild right lower facial weakness. Motor: Normal bulk and tone.  RUE: 4+/5 with weak grip strength; RLE: 4/5 within hip flexor and foot drop with AFO in place.  full strength left upper and lower extremity Sensory.: intact to touch , pinprick , position and vibratory sensation.  Coordination: Rapid alternating movements normal in all extremities except diminished right finger tapping. Finger-to-nose and heel-to-shin performed accurately bilaterally on left side with dysmetria noted RUE. Gait and Station: Gait deferred as rolling walker not present during visit Reflexes: 1+ and symmetric. Toes downgoing.      Diagnostic Data (Labs, Imaging, Testing)  Ct Head Code Stroke Wo Contrast 05/29/2018 1. Age-indeterminate lacunar infarct in the left thalamus.  2. Negative for acute hemorrhage.  3. Motion degraded.     Ct  Angio Head W Or Wo Contrast Ct Angio Neck W Or Wo Contrast 05/29/2018 1. No emergent large vessel occlusion.  2. Small basilar in the setting of bilateral fetal type PCA with severe stenosis of the mid basilar giving an apparent flow gap. No flow is seen within the left vertebral artery until the distal V4 segment.  3. Cervical and intracranial carotid atherosclerosis without flow limiting stenosis.   Mr Maxine GlennMra Head Wo Contrast 05/30/2018 1. Acute perforator infarct in the left corona radiata.  2.Acute lacunar infarct in the right putamen.  3. Background of chronic small vessel disease withremote lacunar infarcts. 4. Intracranial MRA is stable from CTA yesterday. 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   Cerebral Angio 06/02/2018 1.Severe pre occlusive midbasilar artery stenosis. 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 3.Approx 50 % stenosis of LT MCA M 1  Ct Head Wo Contrast 06/02/2018 Chronic changes as described. Similar appearance to priors. No acute intracranial findings.   Mr Brain Wo Contrast 06/02/2018 1. Punctate acute right parietal and right cerebellar infarcts. 2. Unchanged acute left larger than right basal ganglia region infarcts. 3. Moderate chronic small vessel ischemic disease.      ASSESSMENT: Joe Wood is a 68 y.o. year old male presented with right-sided weakness and slurred speech on 05/29/2018 with stroke work-up revealing left BG/CR and right punctate BG infarct status post TPA secondary to large vessel disease with left MCA 50% stenosis. 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  Residual deficits of right hemiparesis and right facial droop.    PLAN:  1. Left BG/CR and right punctate BG infarcts: Continue clopidogrel 75 mg daily  and atorvastatin for secondary stroke prevention.  Advised  to discontinue aspirin at this time as 82-month DAPT completed.  Maintain strict control of hypertension with blood pressure goal below 130/90, diabetes with hemoglobin A1c goal below 6.5% and cholesterol with LDL cholesterol (bad cholesterol) goal below 70 mg/dL.  I also advised the patient to eat a healthy diet with plenty of whole grains, cereals, fruits and vegetables, exercise regularly with at least 30 minutes of continuous activity daily and maintain ideal body weight. 2. Carotid stenosis: Continues to follow with vascular surgery for routine monitoring most recently 12/24/18 and recommended follow-up imaging in 6 months time 3. HTN: Advised to continue current treatment regimen.  Advised to continue to monitor at home and to establish care with PCP 4. HLD: Advised to continue current treatment regimen along and will continue to be monitored and managed once he establishes care with PCP 5. DMII: Advised to continue to monitor glucose levels at home along with ensuring he establishes care with PCP 6. Referral will be placed to establish care with PCP for ongoing follow-up.  Wife was also provided paperwork to obtain a handicap placard due to patient's ongoing right-sided weakness post stroke which limits mobility   Follow up in 6 months or call earlier if needed   Greater than 50% of time during this 25 minute visit was spent on counseling, explanation of diagnosis of recent stroke, reviewing risk factor management of HTN, HLD and DM, planning of further management along with potential future management, and discussion with patient and family answering all questions.    Frann Rider, AGNP-BC  [facility] [address] [place], [place] [address]  Phone [phone] Fax [phone] Note: This document was prepared with digital dictation and possible smart phrase technology. Any transcriptional errors that result from this process are unintentional.

## 2019-02-23 ENCOUNTER — Encounter: Payer: Medicare PPO | Attending: Physical Medicine & Rehabilitation | Admitting: Physical Medicine & Rehabilitation

## 2019-02-23 DIAGNOSIS — R269 Unspecified abnormalities of gait and mobility: Secondary | ICD-10-CM | POA: Insufficient documentation

## 2019-02-23 DIAGNOSIS — I10 Essential (primary) hypertension: Secondary | ICD-10-CM | POA: Insufficient documentation

## 2019-02-23 DIAGNOSIS — G479 Sleep disorder, unspecified: Secondary | ICD-10-CM | POA: Insufficient documentation

## 2019-02-23 DIAGNOSIS — I69359 Hemiplegia and hemiparesis following cerebral infarction affecting unspecified side: Secondary | ICD-10-CM | POA: Insufficient documentation

## 2019-02-23 DIAGNOSIS — E119 Type 2 diabetes mellitus without complications: Secondary | ICD-10-CM | POA: Insufficient documentation

## 2019-03-02 ENCOUNTER — Encounter: Payer: Medicare PPO | Admitting: Physical Medicine & Rehabilitation

## 2019-03-02 ENCOUNTER — Other Ambulatory Visit: Payer: Self-pay

## 2019-03-02 ENCOUNTER — Encounter: Payer: Self-pay | Admitting: Physical Medicine & Rehabilitation

## 2019-03-02 VITALS — BP 188/103 | HR 82 | Temp 97.2°F | Ht 65.0 in | Wt 152.4 lb

## 2019-03-02 DIAGNOSIS — E119 Type 2 diabetes mellitus without complications: Secondary | ICD-10-CM

## 2019-03-02 DIAGNOSIS — G479 Sleep disorder, unspecified: Secondary | ICD-10-CM

## 2019-03-02 DIAGNOSIS — I69359 Hemiplegia and hemiparesis following cerebral infarction affecting unspecified side: Secondary | ICD-10-CM

## 2019-03-02 DIAGNOSIS — R269 Unspecified abnormalities of gait and mobility: Secondary | ICD-10-CM

## 2019-03-02 DIAGNOSIS — I10 Essential (primary) hypertension: Secondary | ICD-10-CM | POA: Diagnosis not present

## 2019-03-02 NOTE — Progress Notes (Signed)
Subjective:    Patient ID: Joe Wood, male    DOB: [birth year], 69 y.o.   MRN: [id number]  HPI Joe  E. 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   Last clinic visit 01/21/2019.  Since that time, pt saw Neuro, notes reviewed. Wife provides history. She is concerned about husband's dementia. He does not have a PCP yet.  He never followed up with therapies and doing HEP. Denies falls.  He is trying to use cane more.  BP remains elevated, however, wife states normally controlled. He has not been checking CBGs.  Sleeping well.   Pain Inventory Average Pain 0 Pain Right Now 0 My pain is na  In the last 24 hours, has pain interfered with the following? General activity 0 Relation with others 0 Enjoyment of life 0 What TIME of day is your pain at its worst? na Sleep (in general) Good  Pain is worse with: na Pain improves with: na Relief from Meds: na  Mobility walk with assistance use a walker how many minutes can you walk? 15 ability to climb steps?  no do you drive?  no use a wheelchair  Function retired I need assistance with the following:  dressing, bathing and toileting  Neuro/Psych trouble walking confusion  Prior Studies Any changes since last visit?  no  Physicians involved in your care Any changes since last visit?  no   Family History  Problem Relation Age of Onset  . Cancer Father    Social History   Socioeconomic History  . Marital status: Married    Spouse name: Not on file  . Number of children: Not on file  . Years of education: Not on file  . Highest education level: Not on file  Occupational History  . Not on file  Tobacco Use  . Smoking status: Never Smoker  . Smokeless tobacco: Never Used  Substance and Sexual Activity  . Alcohol use: Yes    Alcohol/week: 0.0 standard drinks  . Drug use: No  . Sexual activity: Not on  file  Other Topics Concern  . Not on file  Social History Narrative  . Not on file   Social Determinants of Health   Financial Resource Strain:   . Difficulty of Paying Living Expenses: Not on file  Food Insecurity:   . Worried About Programme researcher, broadcasting/film/video in the Last Year: Not on file  . Ran Out of Food in the Last Year: Not on file  Transportation Needs:   . Lack of Transportation (Medical): Not on file  . Lack of Transportation (Non-Medical): Not on file  Physical Activity:   . Days of Exercise per Week: Not on file  . Minutes of Exercise per Session: Not on file  Stress:   . Feeling of Stress : Not on file  Social Connections:   . Frequency of Communication with Friends and Family: Not on file  . Frequency of Social Gatherings with Friends and Family: Not on file  . Attends Religious Services: Not on file  . Active Member of Clubs or Organizations: Not on file  . Attends Banker Meetings: Not on file  . Marital Status: Not on file   Past Surgical History:  Procedure Laterality Date  . IR ANGIO INTRA EXTRACRAN SEL COM CAROTID INNOMINATE BILAT MOD SED  06/02/2018  . IR ANGIO VERTEBRAL SEL SUBCLAVIAN INNOMINATE UNI R MOD  SED  06/02/2018  . IR ANGIOGRAM EXTREMITY LEFT  06/02/2018   Past Medical History:  Diagnosis Date  . Hypertension    BP (!) 188/103   Pulse 82   Temp (!) 97.2 F (36.2 C)   Ht 5\' 5"  (1.651 m)   Wt 152 lb 6.4 oz (69.1 kg)   SpO2 97%   BMI 25.36 kg/m   Opioid Risk Score:   Fall Risk Score:  `1  Depression screen PHQ 2/9  Depression screen [facility] 2/9 09/09/2018 07/11/2018 07/17/2014  Decreased Interest 1 1 0  Down, Depressed, Hopeless 0 0 0  PHQ - 2 Score 1 1 0  Altered sleeping - 3 -  Tired, decreased energy - 1 -  Change in appetite - 0 -  Feeling bad or failure about yourself  - 0 -  Trouble concentrating - 2 -  Moving slowly or fidgety/restless - 2 -  Suicidal thoughts - 0 -  PHQ-9 Score - 9 -    Review of Systems   Constitutional: Negative.   HENT: Negative.   Eyes: Negative.   Respiratory: Negative.   Cardiovascular: Negative.   Gastrointestinal: Negative.   Endocrine: Negative.   Genitourinary: Positive for urgency.  Musculoskeletal: Positive for gait problem.  Skin: Negative.   Allergic/Immunologic: Negative.   Neurological: Positive for weakness and numbness.  Hematological: Negative.   Psychiatric/Behavioral: Negative.        Objective:   Physical Exam  Constitutional: No distress . Respiratory: Normal effort.  Marland Kitchen Psych: Flat Musc: No edema in extremities.  No tenderness in extremities. Neurological:  Alert Motor:  RUE: Shoulder abduction 4+/5, distally 4+/5 with apraxia, stable Right lower extremity: Hip flexion, knee extension 4+/5 with apraxia, ankle dorsiflexion 4+/5    Assessment & Plan:  Joe  E. Wood is a right-handed male history of hypertension presents for follow up for bilateral punctate infarcts with largest infarct in left leftBG/CR.   1.  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  Status post TPA.    Cont HEP   Cont follow up with Neuro  Cont follow up with Cards, appointment next week  Follow up PCP - needs appointment reminded x4  Cont Plavix alone  2. Hypertension   Cont meds  Elevated, controlled at home per wife  Cont meds  3 New findings type 2 diabetes mellitus.    Check CBGs, reminded again x3  Encouraged follow up with PCP, needs to establish  Cont meds  4. Sleep disturbance  Trial d/c Melatonin and monitor for improvement in AM confusion  5. Gait abnormality  Cont rollator for safety  Cont HEP  6. Dementia  Encouraged follow up with PCP

## 2019-05-28 ENCOUNTER — Encounter: Payer: Medicare PPO | Attending: Physical Medicine & Rehabilitation | Admitting: Physical Medicine & Rehabilitation

## 2019-05-28 DIAGNOSIS — E119 Type 2 diabetes mellitus without complications: Secondary | ICD-10-CM | POA: Insufficient documentation

## 2019-05-28 DIAGNOSIS — I69359 Hemiplegia and hemiparesis following cerebral infarction affecting unspecified side: Secondary | ICD-10-CM | POA: Insufficient documentation

## 2019-05-28 DIAGNOSIS — G479 Sleep disorder, unspecified: Secondary | ICD-10-CM | POA: Insufficient documentation

## 2019-05-28 DIAGNOSIS — I10 Essential (primary) hypertension: Secondary | ICD-10-CM | POA: Insufficient documentation

## 2019-05-28 DIAGNOSIS — R269 Unspecified abnormalities of gait and mobility: Secondary | ICD-10-CM | POA: Insufficient documentation

## 2019-06-29 ENCOUNTER — Encounter: Payer: Self-pay | Admitting: Adult Health

## 2019-06-29 ENCOUNTER — Ambulatory Visit: Payer: Medicare Other | Admitting: Adult Health

## 2019-06-29 NOTE — Progress Notes (Deleted)
[facility] [address] [place]. [place] [address] [phone]       STROKE FOLLOW UP NOTE  Joe Wood Date of Birth:  1950-02-24 Medical Record Number:  [id number]   Reason for Referral: stroke follow up    CHIEF COMPLAINT:  No chief complaint on file.   HPI:  Today, 06/29/2018, Joe Wood returns for follow-up regarding left BG/CR and right punctate BG infarcts in 05/2018 secondary to large vessel disease.  Residual deficits of right hemiparesis and right facial droop which have been stable.  Denies new stroke/TIA symptoms.  Ambulates short distance with rolling walker and wheelchair for long distance.  Continues on atorvastatin for secondary stroke prevention.  Blood pressure today ***.  Recent lab work ***.  Has not had recommended 6 month follow-up imaging with NIR at this time.  No concerns at this time.    History provided for reference purposes only Update 12/25/2018 JM: Joe Wood is a 69 year old male who is being seen today for stroke follow-up accompanied by his wife.  Residual deficits right hemiparesis and right facial droop.  He denies any additional speech difficulty.  He continues to use wheelchair for long distance and will use rolling walker for short distance.  He has difficulty tolerating long distance ambulation due to right hemiparesis.  He continues to use AFO brace due to right foot drop and hyperextending of right knee.  He endorses benefit with use of AFO brace.  Wife is asking for a handicap placard due to patient having difficulty ambulating long distances.  Continues on Plavix and atorvastatin for secondary stroke prevention without side effects.  Blood pressure today 144/98.  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  It was recommended to follow-up with repeat imaging in 6 months time.  Denies new or worsening stroke/TIA symptoms.  No further concerns at this time.  Initial visit 09/09/2018 JM: Residual deficits of right hemiparesis, dysarthria and mild short-term memory loss He does endorse improvement with ongoing participation in home PT/OT Currently ambulates with rolling walker without recent falls.  He does use AFO brace with ambulation.  Wife endorses occasional memory difficulties such as forgetting recent conversations or specific dates.  Does endorse improvement.  No prior history of memory difficulties He has continued on DAPT despite recommendation of 38-month duration but denies bleeding or bruising Continues on atorvastatin 40 mg daily without myalgias Blood pressure today 146/80 Denies new or worsening stroke/TIA symptoms  Stroke admission 05/29/2018: Joe E Lindsayis a 68 y.o.malewith history of HTN with possible noncompliance[facility]ed [facility]29/2018 with R sided weakness and slurred speech w/ SBP 240s.  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  With CT head negative for hemorrhage but did show age-indeterminate left pelvic infarct and CTA head/neck negative for emergent LVO with small basilar, bilateral fetal PCAs with severe stenosis mid BA.  He received IV TPA due to ongoing deficits.  MRI showed left BG/CR and right punctate BG infarcts.  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  2D echo normal EF without cardiac source of embolus identified.  Cerebral angiogram showed severe preocclusive mid basilar artery stenosis, occluded left VA origin  and approximately 50% stenosis of left MCA M1.  Neurological worsening on 06/02/2018 following elective cerebral catheter angiogram with repeat MRI showing small cerebral infarcts in right parietal and right cerebellar infarcts not related to mid basilar stenosis.  Hypertensive emergency which stabilized throughout admission and recommended long-term BP goal 1 30-1 50 given hypoplastic versus chronic arthrosclerotic posterior circulation.  Recommended DAPT for 3 months then Plavix alone.  LDL 119 and recommended continuation of atorvastatin 40 mg daily as he was previously noncompliant.  A1c 6.5 with new diagnosis of DM and recommended follow-up with PCP outpatient for ongoing monitoring/management.  Other stroke risk factors include EtOH use but no prior history of stroke.  Residual deficits of mild dysarthria, right lower facial paralysis and right hemiparesis.  Discharged to CIR for ongoing therapy.  Stroke: left BG/CR and right punctate BG infarcts s/p tPA, etiology large vessel disease 50% left MCA stenosis.   Code Stroke CT head age indeterminate L thalamic infarct. ny hmg. Motion degraded. ASPECTS 10.   CTA head &neck no ELVO. Small basilar, B fetal PCAs w/ severe stenosis mid BA.No flow versus hypoplastic L VA.  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 No source of embolus   LDL119  HgbA1c6.5  UDS negative  aspirin 81 mg dailyprior to admission, now on aspirin and Plavix.DAPT for 3 months and then Plavix alone.  Therapy recommendations: Inpt rehab (CIR)   Disposition: CIR - d/c delayed yesterday d/t high BP, which is better controlled.        ROS:   14 system review of systems performed and negative with exception of weakness  PMH:  Past Medical History:  Diagnosis Date  . Hypertension     PSH:  Past Surgical History:  Procedure Laterality Date  . IR ANGIO INTRA  EXTRACRAN SEL COM CAROTID INNOMINATE BILAT MOD SED  06/02/2018  . IR ANGIO VERTEBRAL SEL SUBCLAVIAN INNOMINATE UNI R MOD SED  06/02/2018  . IR ANGIOGRAM EXTREMITY LEFT  06/02/2018    Social History:  Social History   Socioeconomic History  . Marital status: Married    Spouse name: Not on file  . Number of children: Not on file  . Years of education: Not on file  . Highest education level: Not on file  Occupational History  . Not on file  Tobacco Use  . Smoking status: Never Smoker  . Smokeless tobacco: Never Used  Substance and Sexual Activity  . Alcohol use: Yes    Alcohol/week: 0.0 standard drinks  . Drug use: No  . Sexual activity: Not on file  Other Topics Concern  . Not on file  Social History Narrative  . Not on file   Social Determinants of Health   Financial Resource Strain:   . Difficulty of Paying Living Expenses:   Food Insecurity:   . Worried About Charity fundraiser in the Last Year:   . Arboriculturist in the Last Year:   Transportation Needs:   . Film/video editor (Medical):   Marland Kitchen Lack of Transportation (Non-Medical):   Physical Activity:   . Days of Exercise per Week:   . Minutes of Exercise per Session:   Stress:   . Feeling of Stress :   Social Connections:   .  Frequency of Communication with Friends and Family:   . Frequency of Social Gatherings with Friends and Family:   . Attends Religious Services:   . Active Member of Clubs or Organizations:   . Attends Banker Meetings:   Marland Kitchen Marital Status:   Intimate Partner Violence:   . Fear of Current or Ex-Partner:   . Emotionally Abused:   Marland Kitchen Physically Abused:   . Sexually Abused:     Family History:  Family History  Problem Relation Age of Onset  . Cancer Father     Medications:   Current Outpatient Medications on File Prior to Visit  Medication Sig Dispense Refill  . acetaminophen (TYLENOL) 325 MG tablet Take 2 tablets (650 mg total) by mouth every 4 (four) hours as needed  for mild pain (or temp > 37.5 C (99.5 F)).    Marland Kitchen atorvastatin (LIPITOR) 40 MG tablet Take 1 tablet (40 mg total) by mouth daily at 6 PM. 30 tablet 1  . clopidogrel (PLAVIX) 75 MG tablet Take 1 tablet (75 mg total) by mouth daily. 30 tablet 1  . [facility] MELATONIN 3 MG TABS TAKE 1 TABLET (3 MG TOTAL) BY MOUTH AT BEDTIME. 30 tablet 2  . hydrochlorothiazide (MICROZIDE) 12.5 MG capsule Take 1 capsule (12.5 mg total) by mouth daily. 30 capsule 1  . metFORMIN (GLUCOPHAGE) 500 MG tablet Take 1 tablet (500 mg total) by mouth 2 (two) times daily with a meal. 60 tablet 1  . Metoprolol Tartrate 37.5 MG TABS Take 37.5 mg by mouth 2 (two) times daily. 60 tablet 1   No current facility-administered medications on file prior to visit.    Allergies:  No Known Allergies   Physical Exam  There were no vitals filed for this visit. There is no height or weight on file to calculate BMI. No exam data present  General: well developed, well nourished,  pleasant middle-aged African-American male, seated, in no evident distress Head: head normocephalic and atraumatic.   Neck: supple with no carotid or supraclavicular bruits Cardiovascular: regular rate and rhythm, no murmurs Musculoskeletal: no deformity Skin:  no rash/petichiae Vascular:  Normal pulses all extremities   Neurologic Exam Mental Status: Awake and fully alert.   Normal speech and language.  Oriented to place and time. Recent and remote memory subjectively diminished.  Attention span, concentration and fund of knowledge subjectively diminished but appropriate during visit. Mood and affect appropriate.  Cranial Nerves: Pupils equal, briskly reactive to light. Extraocular movements full without nystagmus. Visual fields full to confrontation. Hearing intact. Facial sensation intact.  Very mild right lower facial weakness. Motor: Normal bulk and tone.  RUE: 4+/5 with weak grip strength; RLE: 4/5 within hip flexor and foot drop with AFO in place.  full  strength left upper and lower extremity Sensory.: intact to touch , pinprick , position and vibratory sensation.  Coordination: Rapid alternating movements normal in all extremities except diminished right finger tapping. Finger-to-nose and heel-to-shin performed accurately bilaterally on left side with dysmetria noted RUE. Gait and Station: Gait deferred as rolling walker not present during visit Reflexes: 1+ and symmetric. Toes downgoing.       ASSESSMENT: Joe Wood is a 69 y.o. year old male presented with right-sided weakness and slurred speech on 05/29/2018 with stroke work-up revealing left BG/CR and right punctate BG infarct status post TPA secondary to large vessel disease with left MCA 50% stenosis. 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  Residual deficits of right hemiparesis and right facial droop.    PLAN:  1. Left BG/CR and right punctate BG infarcts: Continue clopidogrel 75 mg daily  and atorvastatin for secondary stroke prevention.  Maintain strict control of hypertension with blood pressure goal below 130/90, diabetes with hemoglobin A1c goal below 6.5% and cholesterol with LDL cholesterol (bad cholesterol) goal below 70 mg/dL.  I also advised the patient to eat a healthy diet with plenty of whole grains, cereals, fruits and vegetables, exercise regularly with at least 30 minutes of continuous activity daily and maintain ideal body weight. 2. Carotid stenosis: Continues to follow with vascular surgery for routine monitoring most recently 12/24/18 and recommended follow-up imaging in 6 months time 3. HTN: Advised to continue current treatment regimen.  Advised to continue to monitor at home and to establish care with PCP 4. HLD: Advised to continue current treatment regimen along and will continue to be monitored and  managed once he establishes care with PCP 5. DMII: Advised to continue to monitor glucose levels at home along with ensuring he establishes care with PCP 6. Referral will be placed to establish care with PCP for ongoing follow-up.  Wife was also provided paperwork to obtain a handicap placard due to patient's ongoing right-sided weakness post stroke which limits mobility   Follow up in 6 months or call earlier if needed   Greater than 50% of time during this 25 minute visit was spent on counseling, explanation of diagnosis of recent stroke, reviewing risk factor management of HTN, HLD and DM, planning of further management along with potential future management, and discussion with patient and family answering all questions.    Ihor Austin, AGNP-BC  [facility] [address] [place], [place] [address]  Phone [phone] Fax [phone] Note: This document was prepared with digital dictation and possible smart phrase technology. Any transcriptional errors that result from this process are unintentional.

## 2019-07-02 ENCOUNTER — Other Ambulatory Visit (HOSPITAL_COMMUNITY): Payer: Self-pay | Admitting: Interventional Radiology

## 2019-07-02 ENCOUNTER — Telehealth (HOSPITAL_COMMUNITY): Payer: Self-pay

## 2019-07-02 NOTE — Telephone Encounter (Signed)
Called to schedule cta head/neck, no answer, left vm. AW 

## 2019-07-15 ENCOUNTER — Telehealth (HOSPITAL_COMMUNITY): Payer: Self-pay

## 2019-07-15 ENCOUNTER — Other Ambulatory Visit (HOSPITAL_COMMUNITY): Payer: Self-pay | Admitting: Interventional Radiology

## 2019-07-15 NOTE — Telephone Encounter (Signed)
Called to schedule cta head/neck, no answer, left vm. AW 

## 2019-07-28 ENCOUNTER — Other Ambulatory Visit (HOSPITAL_COMMUNITY): Payer: Self-pay | Admitting: Interventional Radiology

## 2019-07-28 DIAGNOSIS — I651 Occlusion and stenosis of basilar artery: Secondary | ICD-10-CM

## 2019-08-10 ENCOUNTER — Telehealth (HOSPITAL_COMMUNITY): Payer: Self-pay

## 2019-08-10 NOTE — Telephone Encounter (Signed)
Called to remind pt of cta head/neck on 08/11/19, no answer, left vm. AW

## 2019-08-11 ENCOUNTER — Ambulatory Visit (HOSPITAL_COMMUNITY)
Admission: RE | Admit: 2019-08-11 | Discharge: 2019-08-11 | Disposition: A | Discharge status: Home / Self Care | Payer: Medicare PPO | Source: Ambulatory Visit | Attending: Interventional Radiology | Admitting: Interventional Radiology

## 2019-08-11 DIAGNOSIS — I651 Occlusion and stenosis of basilar artery: Secondary | ICD-10-CM | POA: Diagnosis present

## 2019-08-11 LAB — POCT I-STAT CREATININE: Creatinine, Ser: 0.8 mg/dL (ref 0.61–1.24)

## 2019-08-11 MED ORDER — IOHEXOL 350 MG/ML SOLN
50.0000 mL | Freq: Once | INTRAVENOUS | Status: AC | PRN
  Administered 2019-08-11: 50 mL via INTRAVENOUS

## 2019-08-17 ENCOUNTER — Telehealth (HOSPITAL_COMMUNITY): Payer: Self-pay

## 2019-08-17 NOTE — Telephone Encounter (Signed)
Called pt's wife regarding recent cta, no answer, left vm. AW

## 2019-08-18 ENCOUNTER — Telehealth (HOSPITAL_COMMUNITY): Payer: Self-pay

## 2019-08-18 NOTE — Telephone Encounter (Signed)
Pt agreed to f/u in 6 months with cta head/neck. AW 

## 2019-09-21 ENCOUNTER — Ambulatory Visit: Payer: Medicare PPO | Admitting: Adult Health

## 2019-10-26 ENCOUNTER — Encounter: Payer: Self-pay | Admitting: Adult Health

## 2019-10-26 ENCOUNTER — Ambulatory Visit (INDEPENDENT_AMBULATORY_CARE_PROVIDER_SITE_OTHER): Payer: Medicare PPO | Admitting: Adult Health

## 2019-10-26 VITALS — BP 151/85 | HR 61 | Ht 64.0 in | Wt 143.0 lb

## 2019-10-26 DIAGNOSIS — I10 Essential (primary) hypertension: Secondary | ICD-10-CM | POA: Diagnosis not present

## 2019-10-26 DIAGNOSIS — E785 Hyperlipidemia, unspecified: Secondary | ICD-10-CM

## 2019-10-26 DIAGNOSIS — I651 Occlusion and stenosis of basilar artery: Secondary | ICD-10-CM

## 2019-10-26 DIAGNOSIS — I6381 Other cerebral infarction due to occlusion or stenosis of small artery: Secondary | ICD-10-CM

## 2019-10-26 DIAGNOSIS — E119 Type 2 diabetes mellitus without complications: Secondary | ICD-10-CM

## 2019-10-26 NOTE — Progress Notes (Signed)
I agree with the above plan 

## 2019-10-26 NOTE — Patient Instructions (Signed)
Continue clopidogrel 75 mg daily  and atrovastatin  for secondary stroke prevention  Continue to follow up with PCP regarding cholesterol, blood pressure and diabetes management  Maintain strict control of hypertension with blood pressure goal below 130/90, diabetes with hemoglobin A1c goal below 7.0% and cholesterol with LDL cholesterol (bad cholesterol) goal below 70 mg/dL.    As you have been doing well from a stroke standpoint, recommend follow up on an as needed basis. Please do not hesitate to call with any questions or concerns regarding your prior stroke in the future      Thank you for coming to see [place] at [facility]. I hope we have been able to provide you high quality care today.  You may receive a patient satisfaction survey over the next few weeks. We would appreciate your feedback and comments so that we may continue to improve ourselves and the health of our patients.

## 2019-10-26 NOTE — Progress Notes (Signed)
[facility] [address] [place]. [place] [address] [phone]       STROKE FOLLOW UP NOTE  [name] Date of Birth:  Aug 03, 1950 Medical Record Number:  [id number]   Reason for Referral: stroke follow up    CHIEF COMPLAINT:  Chief Complaint  Patient presents with  . Follow-up    rm 9  . Cerebrovascular Accident    pt sais he is having no new sx. Pt said he is still having right had numbness    HPI:  Today, 10/26/2019, Joe Wood returns for stroke follow-up accompanied by his wife.  Previously seen 12/2018 and recommended 76-month follow-up at that time.  He reports great improvement since prior visit denying residual weakness and only occasional right hand numbness.  Continues to use RW for ambulation and denies any recent falls.  Currently sitting in wheelchair due to long distance.  Denies new or worsening stroke/TIA symptoms.  Remains on clopidogrel and atorvastatin for secondary stroke prevention without side effects.  Blood pressure today 151/85. He does not monitor at home - per wife, is currently waiting to receive a BP machine from pharmacy.  Remains on metoprolol and hydrochlorothiazide per PCP.  Remains on Metformin for DM management. Has established care with PCP and plans on follow up in the near future for lab work. No concerns at this time.    History provided for reference purposes only Update 12/25/2018 JM: Joe Wood is a 69 year old male who is being seen today for stroke follow-up accompanied by his wife.  Residual deficits right hemiparesis and right facial droop.  He denies any additional speech difficulty.  He continues to use wheelchair for long distance and will use rolling walker for short distance.  He has difficulty tolerating long distance ambulation due to right hemiparesis.  He continues to use AFO brace due to right foot drop and hyperextending of right knee.  He endorses benefit with use of AFO brace.  Wife is asking for a  handicap placard due to patient having difficulty ambulating long distances.  Continues on Plavix and atorvastatin for secondary stroke prevention without side effects.  Blood pressure today 144/98.  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  It was recommended to follow-up with repeat imaging in 6 months time.  Denies new or worsening stroke/TIA symptoms.  No further concerns at this time.  Initial visit 09/09/2018 JM: Residual deficits of right hemiparesis, dysarthria and mild short-term memory loss He does endorse improvement with ongoing participation in home PT/OT Currently ambulates with rolling walker without recent falls.  He does use AFO brace with ambulation.  Wife endorses occasional memory difficulties such as forgetting recent conversations or specific dates.  Does endorse improvement.  No prior history of memory difficulties He has continued on DAPT despite recommendation of 36-month duration but denies bleeding or bruising Continues on atorvastatin 40 mg daily without myalgias Blood pressure today 146/80 Denies new or worsening stroke/TIA symptoms  Stroke admission 05/29/2018: Joe Wood a 68 y.o.malewith history of HTN with possible noncompliance[facility] with R sided weakness and slurred speech w/ SBP 240s.  Neurology  consulted with stroke work-up revealing left BG/CR and right punctate BG infarct status post TPA secondary to large vessel disease 50% left MCA stenosis.  With CT head negative for hemorrhage but did show age-indeterminate left  pelvic infarct and CTA head/neck negative for emergent LVO with small basilar, bilateral fetal PCAs with severe stenosis mid BA.  He received IV TPA due to ongoing deficits.  MRI showed left BG/CR and right punctate BG infarcts.  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  2D echo normal EF without cardiac source of embolus identified.  Cerebral angiogram showed severe preocclusive mid basilar artery stenosis, occluded left VA origin and approximately 50% stenosis of left MCA M1.  Neurological worsening on 06/02/2018 following elective cerebral catheter angiogram with repeat MRI showing small cerebral infarcts in right parietal and right cerebellar infarcts not related to mid basilar stenosis.  Hypertensive emergency which stabilized throughout admission and recommended long-term BP goal 1 30-1 50 given hypoplastic versus chronic arthrosclerotic posterior circulation.  Recommended DAPT for 3 months then Plavix alone.  LDL 119 and recommended continuation of atorvastatin 40 mg daily as he was previously noncompliant.  A1c 6.5 with new diagnosis of DM and recommended follow-up with PCP outpatient for ongoing monitoring/management.  Other stroke risk factors include EtOH use but no prior history of stroke.  Residual deficits of mild dysarthria, right lower facial paralysis and right hemiparesis.  Discharged to CIR for ongoing therapy.    ROS:   14 system review of systems performed and negative with exception of occasional numbness  PMH:  Past Medical History:  Diagnosis Date  . Hypertension     PSH:  Past Surgical History:  Procedure Laterality Date  . IR ANGIO INTRA EXTRACRAN SEL COM CAROTID INNOMINATE BILAT MOD SED  06/02/2018  . IR ANGIO VERTEBRAL SEL SUBCLAVIAN INNOMINATE UNI R MOD SED  06/02/2018  . IR ANGIOGRAM EXTREMITY LEFT  06/02/2018    Social History:  Social History   Socioeconomic History  . Marital status: Married    Spouse name: Not on file  . Number of children:  Not on file  . Years of education: Not on file  . Highest education level: Not on file  Occupational History  . Not on file  Tobacco Use  . Smoking status: Never Smoker  . Smokeless tobacco: Never Used  Vaping Use  . Vaping Use: Never used  Substance and Sexual Activity  . Alcohol use: Yes    Alcohol/week: 0.0 standard drinks  . Drug use: No  . Sexual activity: Not on file  Other Topics Concern  . Not on file  Social History Narrative  . Not on file   Social Determinants of Health   Financial Resource Strain:   . Difficulty of Paying Living Expenses: Not on file  Food Insecurity:   . Worried About Programme researcher, broadcasting/film/video in the Last Year: Not on file  . Ran Out of Food in the Last Year: Not on file  Transportation Needs:   . Lack of Transportation (Medical): Not on file  . Lack of Transportation (Non-Medical): Not on file  Physical Activity:   . Days of Exercise per Week: Not on file  . Minutes of Exercise per Session: Not on file  Stress:   . Feeling of Stress : Not on file  Social Connections:   . Frequency of Communication with Friends and Family: Not on file  . Frequency of Social Gatherings with Friends and Family: Not on file  .  Attends Religious Services: Not on file  . Active Member of Clubs or Organizations: Not on file  . Attends BankerClub or Organization Meetings: Not on file  . Marital Status: Not on file  Intimate Partner Violence:   . Fear of Current or Ex-Partner: Not on file  . Emotionally Abused: Not on file  . Physically Abused: Not on file  . Sexually Abused: Not on file    Family History:  Family History  Problem Relation Age of Onset  . Cancer Father     Medications:   Current Outpatient Medications on File Prior to Visit  Medication Sig Dispense Refill  . amLODipine (NORVASC) 10 MG tablet Take 10 mg by mouth daily.    Marland Kitchen. atorvastatin (LIPITOR) 40 MG tablet Take 1 tablet (40 mg total) by mouth daily at 6 PM. 30 tablet 1  . clopidogrel (PLAVIX) 75  MG tablet Take 1 tablet (75 mg total) by mouth daily. 30 tablet 1  . losartan (COZAAR) 25 MG tablet Take 25 mg by mouth daily.    . metFORMIN (GLUCOPHAGE) 500 MG tablet Take 1 tablet (500 mg total) by mouth 2 (two) times daily with a meal. 60 tablet 1  . Metoprolol Tartrate 37.5 MG TABS Take 37.5 mg by mouth 2 (two) times daily. 60 tablet 1   No current facility-administered medications on file prior to visit.    Allergies:  No Known Allergies   Physical Exam  Vitals:   10/26/19 1036  BP: (!) 151/85  Pulse: 61  Weight: 143 lb (64.9 kg)  Height: 5\' 4"  (1.626 m)   Body mass index is 24.55 kg/m. No exam data present  General: well developed, well nourished, pleasant middle-aged African-American male, seated, in no evident distress Head: head normocephalic and atraumatic.   Neck: supple with no carotid or supraclavicular bruits Cardiovascular: regular rate and rhythm, no murmurs Musculoskeletal: no deformity Skin:  no rash/petichiae Vascular:  Normal pulses all extremities   Neurologic Exam Mental Status: Awake and fully alert.   Fluent speech and language.  Oriented to place and time. Recent and remote memory subjectively diminished.  Attention span, concentration and fund of knowledge subjectively diminished but appropriate during visit. Mood and affect appropriate.  Cranial Nerves: Pupils equal, briskly reactive to light. Extraocular movements full without nystagmus. Visual fields full to confrontation. Hearing intact. Facial sensation intact.  Right nasolabial fold flattening. Motor: Normal bulk and tone.  Full strength in all tested extremities except very slight right ankle dorsiflexion weakness Sensory.: intact to touch , pinprick , position and vibratory sensation.  Coordination: Rapid alternating movements normal in all extremities. Finger-to-nose and heel-to-shin performed accurately bilaterally. Gait and Station: Gait deferred as rolling walker not present during  visit Reflexes: 2+ and symmetric. Toes downgoing.         ASSESSMENT/PLAN: Joe DavidsonDarryl E Wood is a 69 y.o. year old male presented with right-sided weakness and slurred speech on 05/29/2018 with stroke work-up revealing left BG/CR and right punctate BG infarct status post TPA secondary to large vessel disease with left MCA 50% stenosis. 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       1. 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 Residual deficits: Intermittent right hand numbness and mild right ankle dorsiflexion weakness.  Great improvement from prior visit.  Discussed importance of routine exercising and regular activity as well as use  of rolling walker at all times for fall prevention b. Continue clopidogrel 75 mg daily  and atorvastatin for secondary stroke prevention.   c. Discussed secondary stroke prevention measures and importance of close PCP follow-up for aggressive stroke risk factor management  2. Cerebral stenosis:  a. Managed by IR Dr. Corliss Skains.   b. CTA head/neck 08/11/2019 stable appearance and recommended 55-month follow-up imaging.   3. HTN:  a. Slightly elevated.  BP goal<130/90.  b.  Remains on hydrochlorothiazide and metoprolol per PCP.   c. Recommend routine monitoring at home to ensure adequate management  4. HLD:  a. LDL goal<70.   b. Remains on atorvastatin 40 mg daily per PCP c. Routine follow-up with PCP and ensure repeat lipid panel at follow-up visit with PCP  5. DMII:  a. A1c goal<7.0.   b. Remains on Metformin per PCP   Overall stable from stroke standpoint and recommend follow-up on an as-needed basis   I spent 30 minutes of face-to-face and non-face-to-face time with patient and wife.  This included previsit chart review, lab review, study review, order entry, electronic health record  documentation, patient education regarding prior stroke, minimal residual deficits, secondary stroke prevention measures and importance of managing stroke risk factors and answered all questions to patient and wife satisfaction     Ihor Austin, AGNP-BC  [facility] [address] [place], [place] [address]  Phone [phone] Fax [phone] Note: This document was prepared with digital dictation and possible smart phrase technology. Any transcriptional errors that result from this process are unintentional.

## 2021-03-01 IMAGING — MR MRI HEAD WITHOUT CONTRAST
9 of 11 series · 36 of 48 positions shown · non-contrast
Comparison: Head CT 06/02/2018 and MRI 05/30/2018

CLINICAL DATA: Increased dysarthria, right hemiparesis, and
involuntary movements of the left hand, face, and lower extremities
following cerebral angiography today which revealed a severe basilar
artery stenosis.

EXAM:
MRI HEAD WITHOUT CONTRAST
TECHNIQUE: Multiplanar, multiecho pulse sequences of the brain and surrounding
structures were obtained without intravenous contrast.

[Series 5: DWI · axial · 3.0mm · 0.94mm/px · z∈[-74,+65]mm · 7 of 96 slices shown (1 of 3)]
[im 1/96]
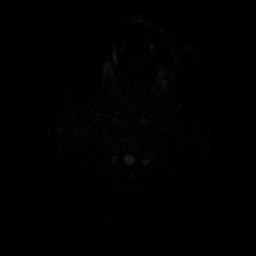
[im 16/96]
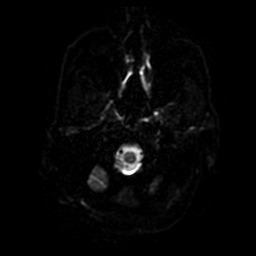
[im 32/96]
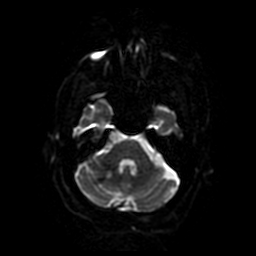
[im 48/96]
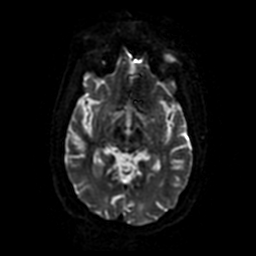
[im 64/96]
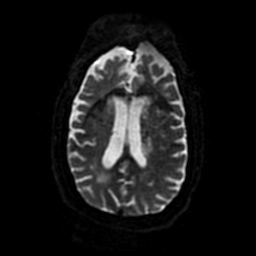
[im 80/96]
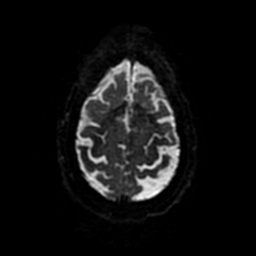
[im 96/96]
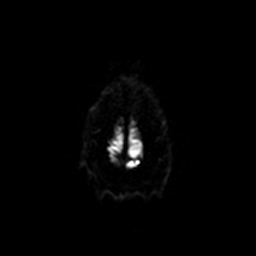

[Series 6: FLAIR · axial · 3.0mm · 0.47mm/px · z∈[-73,+64]mm · 2 of 24 slices shown]
[im 1/24]
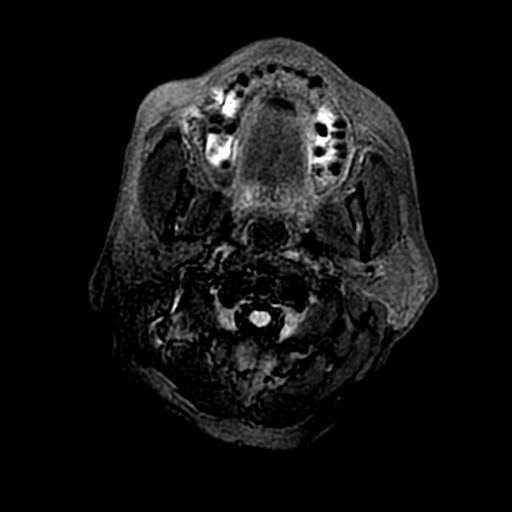
[im 24/24]
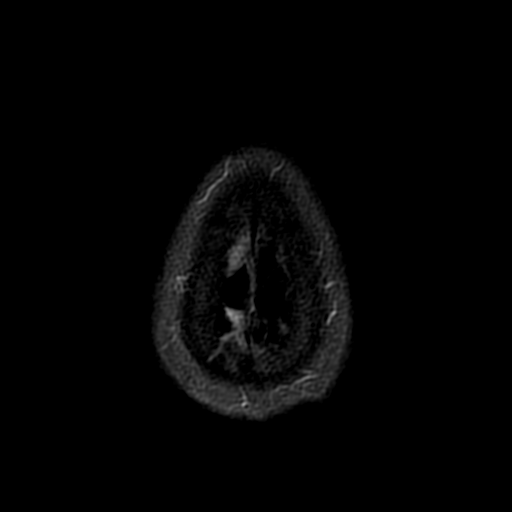

[Series 7: SWI · axial · 3.0mm · 0.47mm/px · z∈[-74,+25]mm · 6 of 96 slices shown]
[im 1/96]
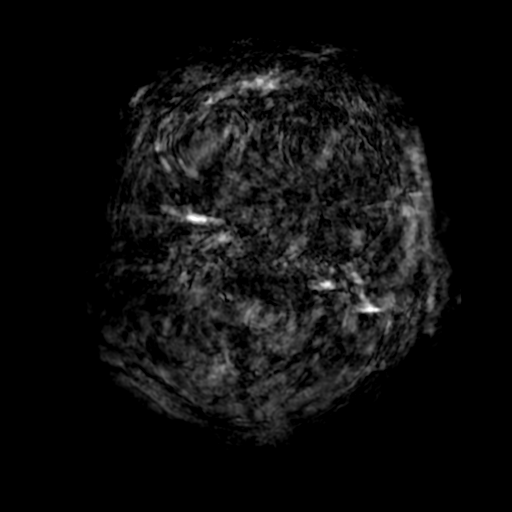
[im 14/96]
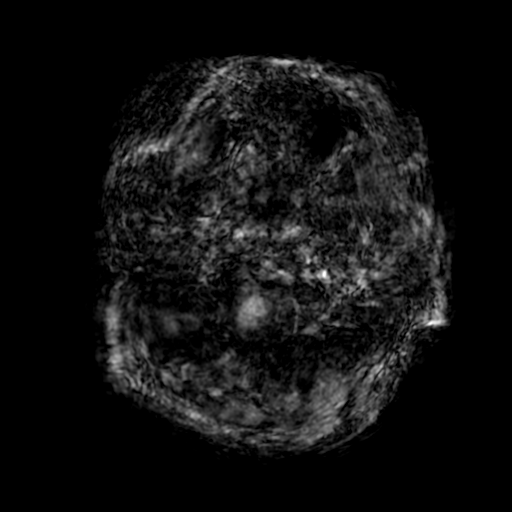
[im 28/96]
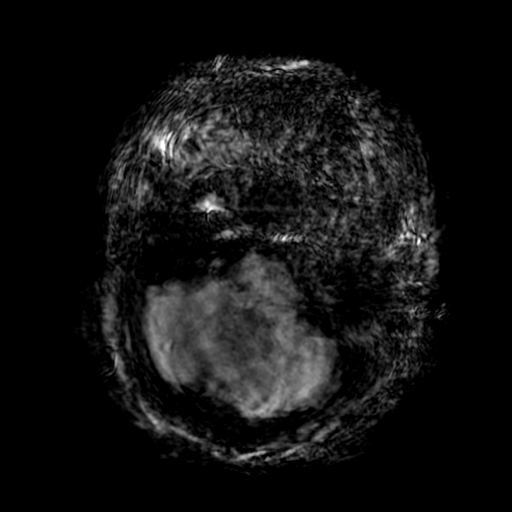
[im 41/96]
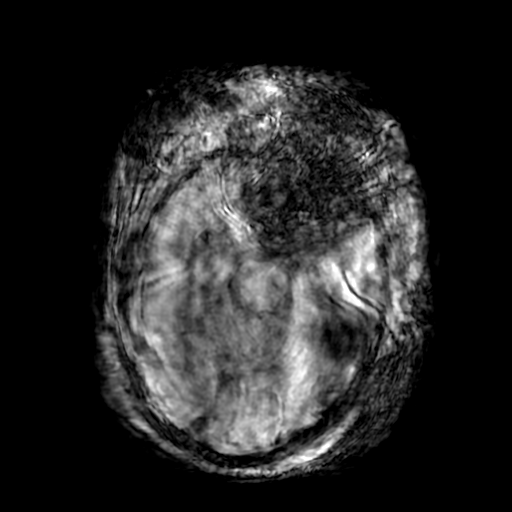
[im 55/96]
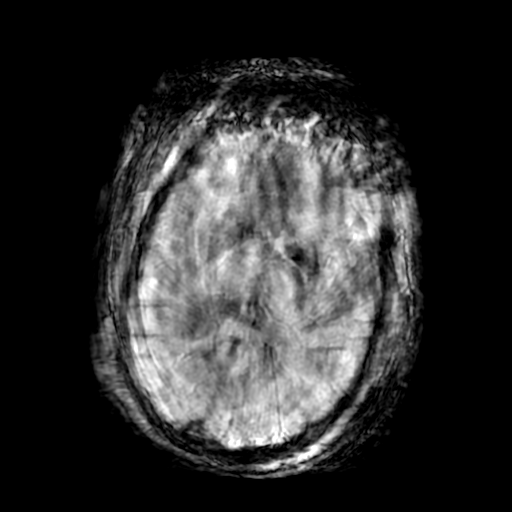
[im 68/96]
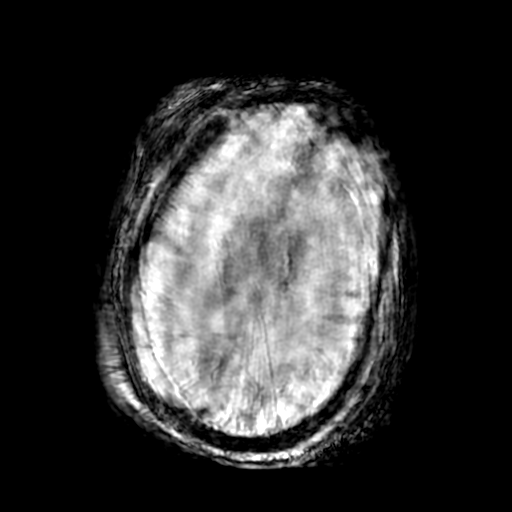

[Series 9: T2 · axial · 5.0mm · 0.47mm/px · z∈[-73,+64]mm · 2 of 24 slices shown]
[im 1/24]
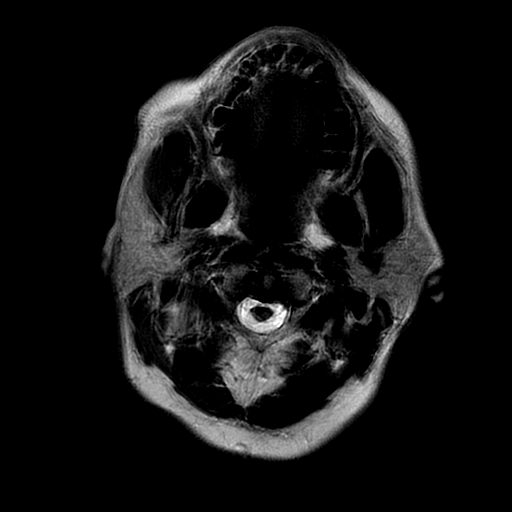
[im 24/24]
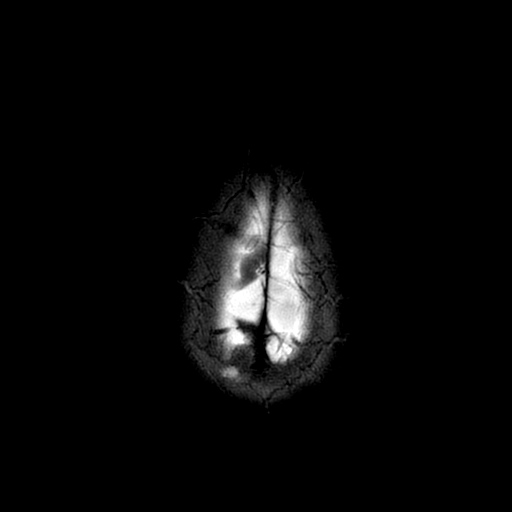

[Series 10: DWI · coronal · 4.0mm · 0.94mm/px · 6 of 72 slices shown (2 of 3)]
[im 1/72]
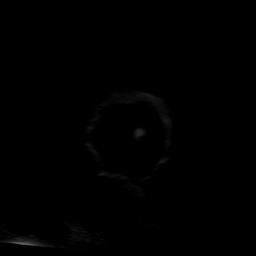
[im 15/72]
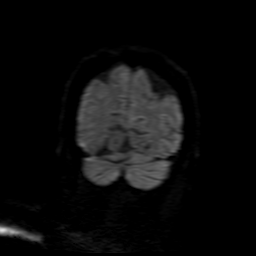
[im 29/72]
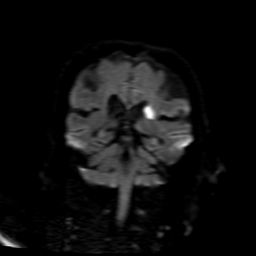
[im 43/72]
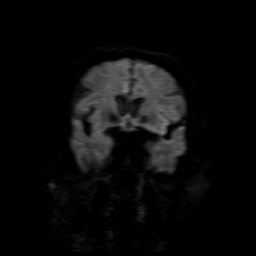
[im 57/72]
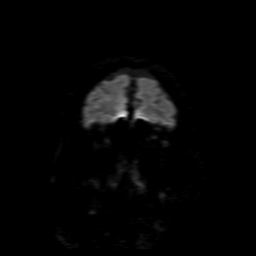
[im 72/72]
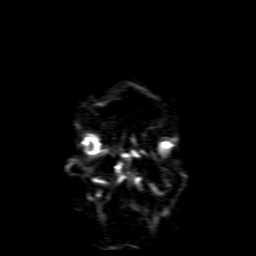

[Series 13: DWI · axial · 3.0mm · 0.70mm/px · z∈[-68,-3]mm · 4 of 46 slices shown (3 of 3)]
[im 1/46]
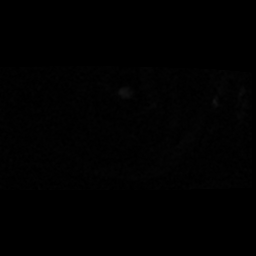
[im 16/46]
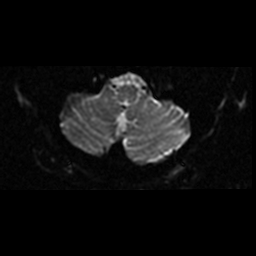
[im 31/46]
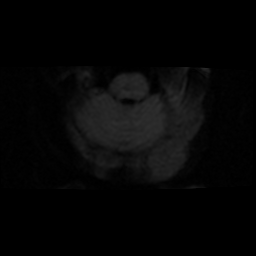
[im 46/46]
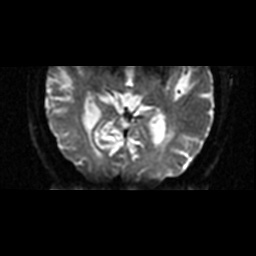

[Series 550: ADC · axial · 3.0mm · 0.94mm/px · z∈[-74,+65]mm · 4 of 48 slices shown (1 of 3)]
[im 1/48]
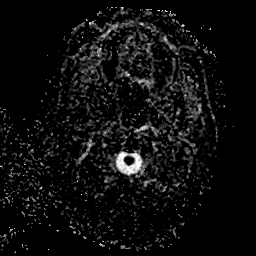
[im 16/48]
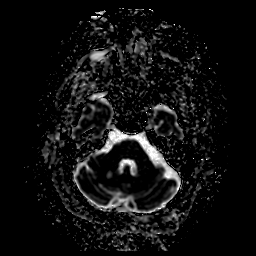
[im 32/48]
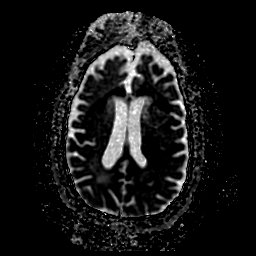
[im 48/48]
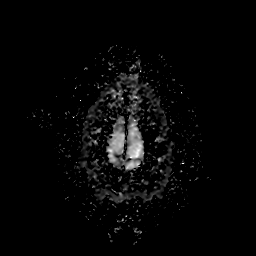

[Series 1050: ADC · coronal · 4.0mm · 0.94mm/px · 3 of 36 slices shown (2 of 3)]
[im 1/36]
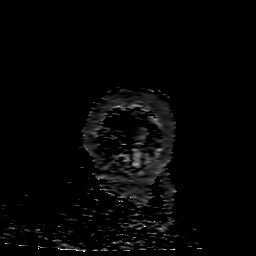
[im 18/36]
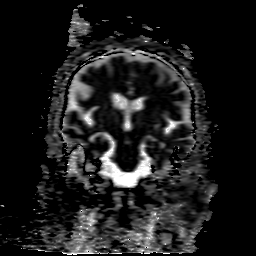
[im 36/36]
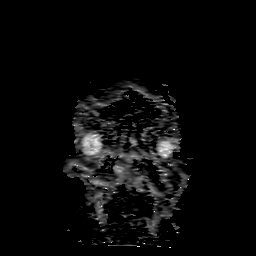

[Series 1350: ADC · axial · 3.0mm · 0.70mm/px · z∈[-68,-3]mm · 2 of 23 slices shown (3 of 3)]
[im 1/23]
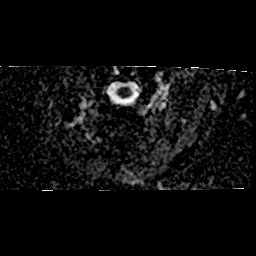
[im 23/23]
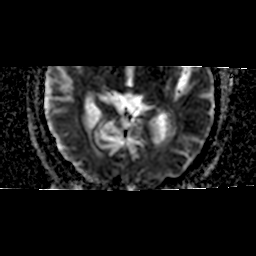

[36 of 48 positions shown; findings below may reference images not displayed]

FINDINGS: A limited brain MRI was requested by the ordering neurologist. Axial
and coronal diffusion, axial SWI, axial T2* gradient echo, axial
FLAIR, and axial T2 imaging was performed. The study is variably
motion degraded with the SWI sequence being severely degraded.

Brain: A 2.5 cm acute left lateral lenticulostriate territory
infarct involving the posterior corona radiata and basal ganglia and
a punctate acute infarct in the right putamen/external capsule are
unchanged from the prior MRI. There are 2 new punctate acute
infarcts, 1 in the parietal lobe and 1 in the inferior right
cerebellar hemisphere. No intracranial hemorrhage, intracranial mass
effect, or extra-axial fluid collection is identified. There is mild
cerebral atrophy. Small chronic infarcts are again noted in the left
thalamus, left frontal periventricular white matter, right pons, and
right cerebellum. Patchy T2 hyperintensities elsewhere in the
cerebral white matter bilaterally are unchanged and compatible with
moderate chronic small vessel ischemic disease.

Vascular: Major intracranial vascular flow voids are preserved.

Skull and upper cervical spine: No suspicious marrow lesion.

Sinuses/Orbits: Unremarkable orbits. Paranasal sinuses and mastoid
air cells are clear.

Other: None.
IMPRESSION: 1. Punctate acute right parietal and right cerebellar infarcts.
2. Unchanged acute left larger than right basal ganglia region
infarcts.
3. Moderate chronic small vessel ischemic disease.

## 2021-03-01 IMAGING — XA BILATERAL COMMON CAROTID AND INNOMINATE ANGIOGRAPHY
1 series · 12 of 24 positions shown · IV contrast (IODINE)
Comparison: MRI MRA of the brain of 05/30/2018.

CLINICAL DATA: Dysarthria with right-sided weakness. Abnormal MRI
of the brain with bihemispheric DWI changes. Attenuated flow signal
in the mid basilar artery on MRA examination.

EXAM:
BILATERAL COMMON CAROTID AND INNOMINATE ANGIOGRAPHY; LEFT EXTREMITY
ARTERIOGRAPHY; IR ANGIO VERTEBRAL SEL SUBCLAVIAN INNOMINATE UNI
RIGHT MOD SED
TECHNIQUE: Informed written consent was obtained from the patient after a
thorough discussion of the procedural risks, benefits and
alternatives. All questions were addressed. Maximal Sterile Barrier
Technique was utilized including caps, mask, sterile gowns, sterile
gloves, sterile drape, hand hygiene and skin antiseptic. A timeout
was performed prior to the initiation of the procedure.

[Series 300: dr. (person_name) · 12 of 170 slices shown]
[im 8/170]
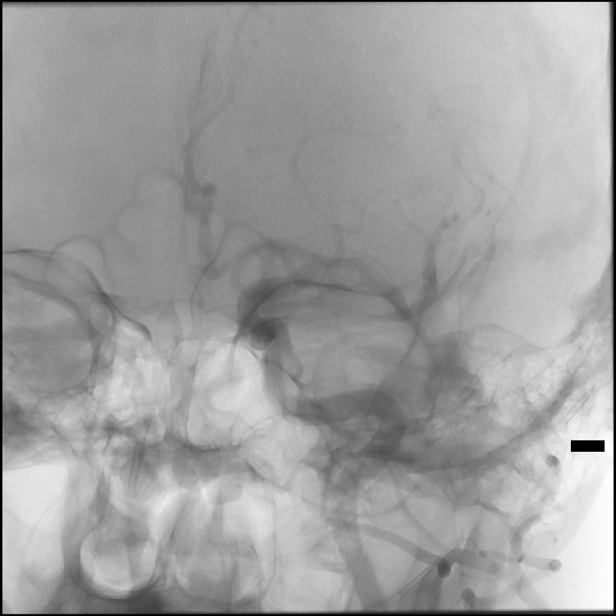
[im 23/170]
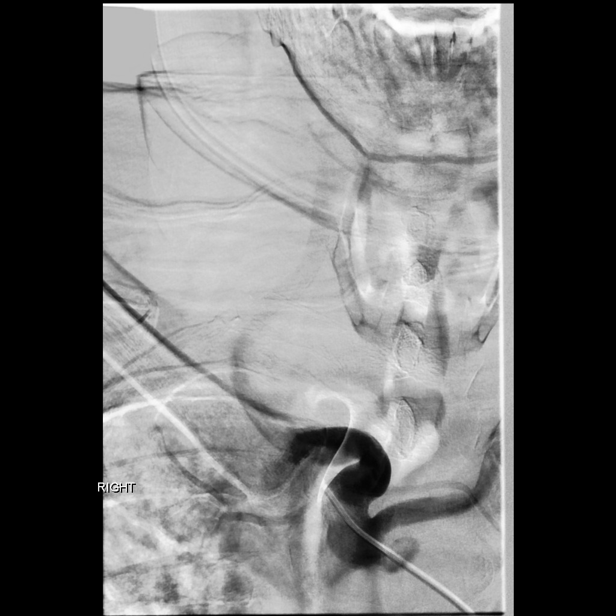
[im 37/170]
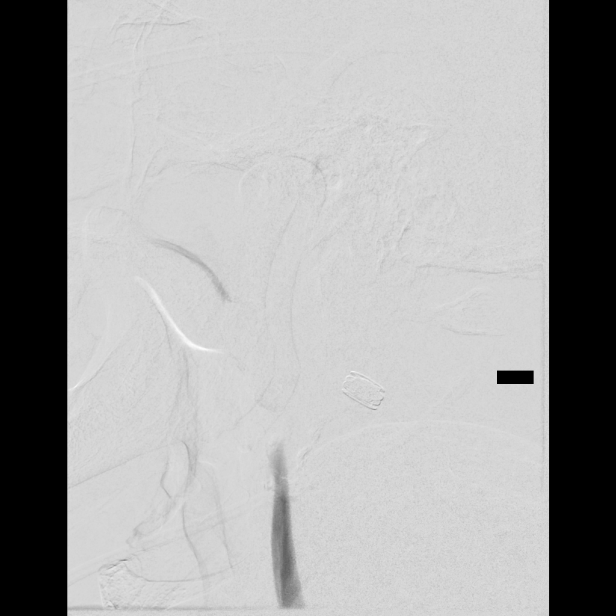
[im 52/170]
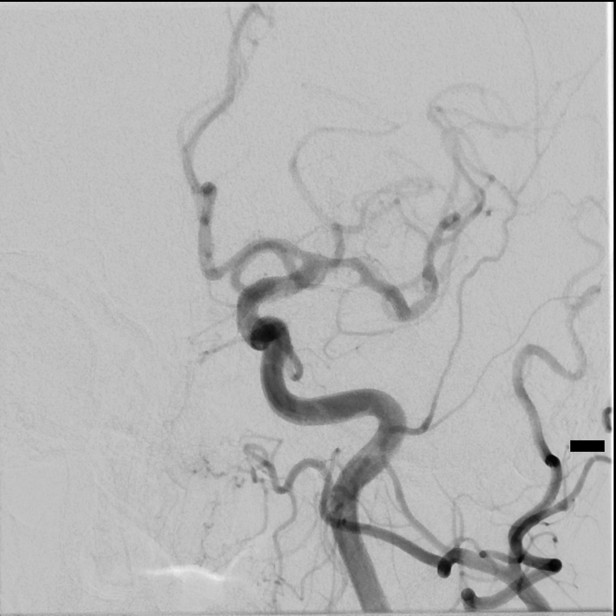
[im 67/170]
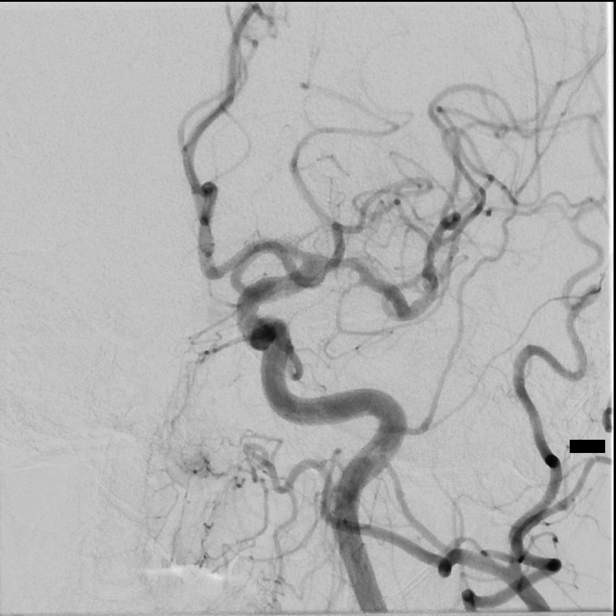
[im 81/170]
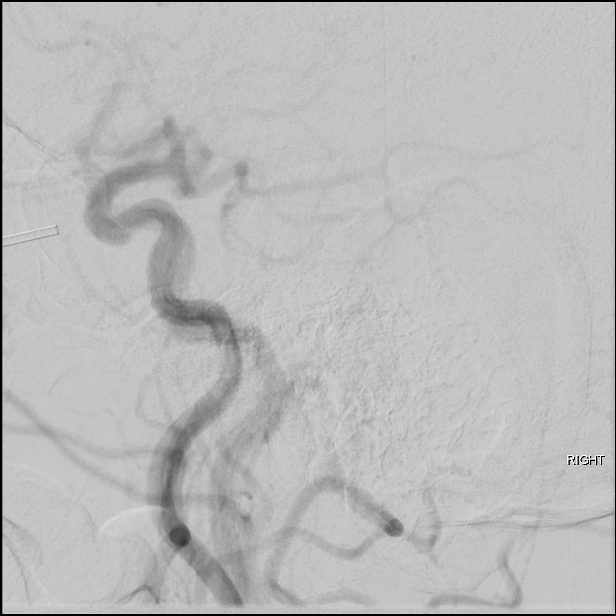
[im 96/170]
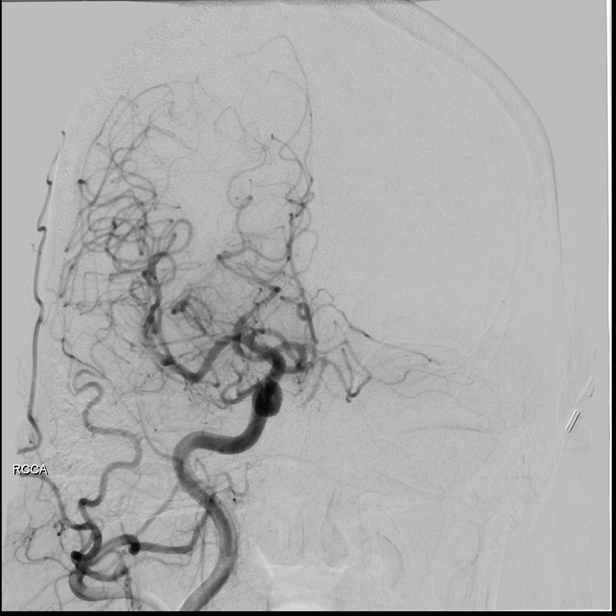
[im 111/170]
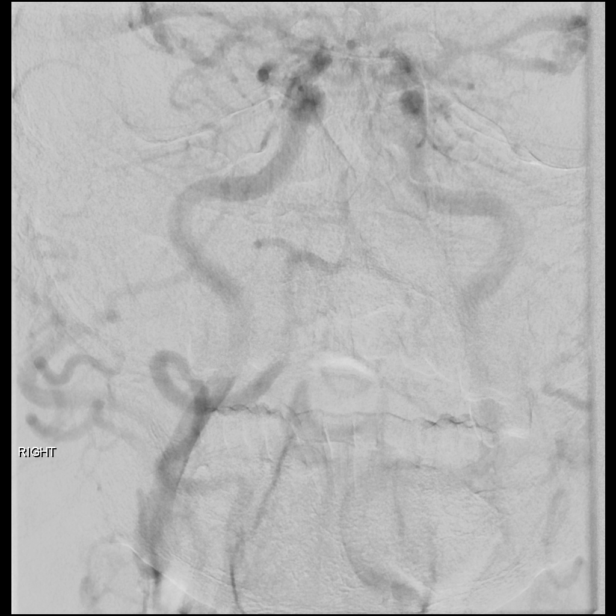
[im 125/170]
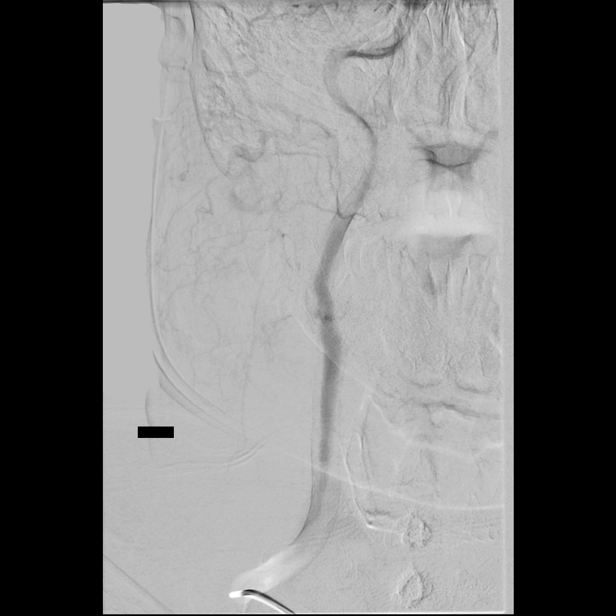
[im 140/170]
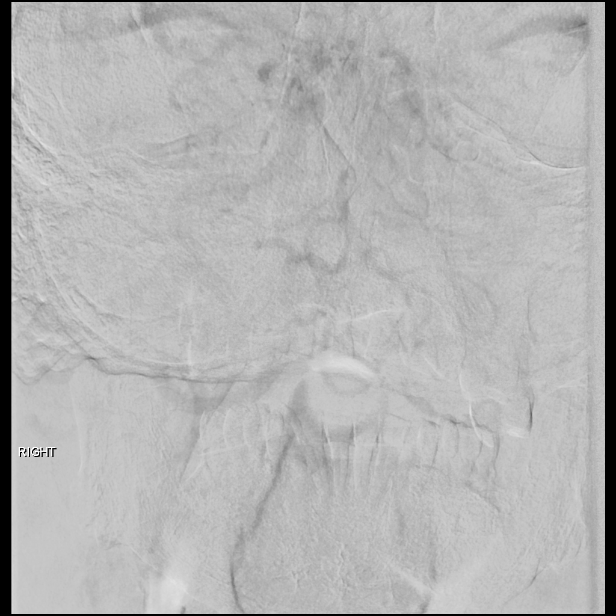
[im 155/170]
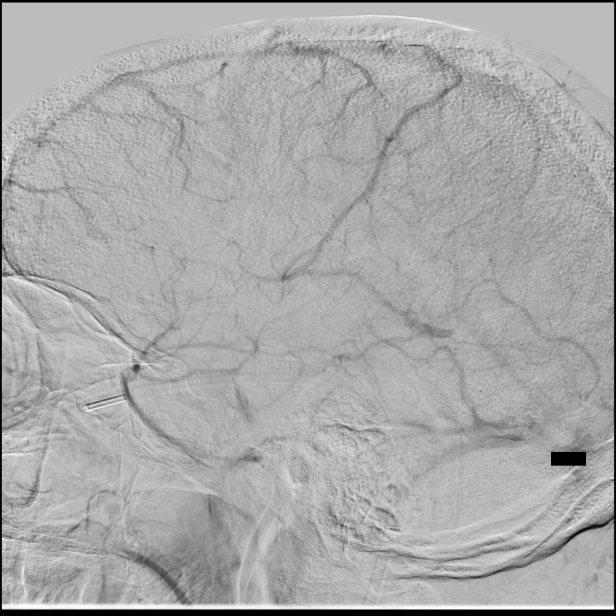
[im 170/170]
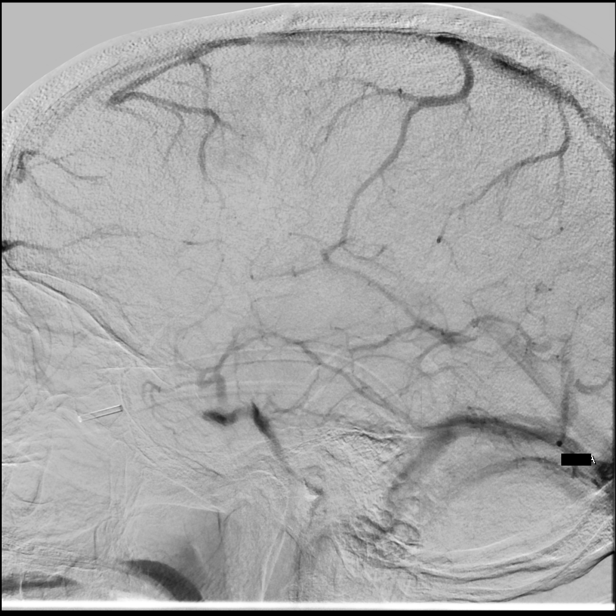

[12 of 24 positions shown; findings below may reference images not displayed]

MEDICATIONS:
Heparin 8222 units IV; No antibiotic was administered within 1 hour
of the procedure.

ANESTHESIA/SEDATION:
Versed 1 mg IV; Fentanyl 25 mcg IV.  Hydralazine 5 mg IV 1 time.

Moderate Sedation Time:  28 minutes.

The patient was continuously monitored during the procedure by the
interventional radiology nurse under my direct supervision.

CONTRAST:  Isovue 300 approximately 45 mL.

FLUOROSCOPY TIME:  Fluoroscopy Time: 7 minutes 43 seconds (764 mGy).

COMPLICATIONS:
None immediate.
The right groin was prepped and draped in the usual sterile fashion.
Thereafter using modified Seldinger technique, transfemoral access
into the right common femoral artery was obtained without
difficulty. Over a 0.035 inch guidewire, a 5 French Pinnacle sheath
was inserted. Through this, and also over 0.035 inch guidewire, a 5
French JB 1 catheter was advanced to the aortic arch region and
selectively positioned in the right common carotid artery, the right
subclavian artery, the left common carotid artery and the left
subclavian artery.
FINDINGS: The right subclavian arteriogram demonstrates widely patent right
vertebral artery.

The vessel is seen to opacify to the cranial skull base.

Patency is seen of the right vertebrobasilar junction and the right
posterior-inferior cerebellar artery.

Severe stenosis of the mid basilar artery is seen with trickle flow
into the distal basilar artery.

The right common carotid arteriogram demonstrates the right external
carotid artery and its major branches to be widely patent.

The right internal carotid artery at the bulb has a smooth shallow
plaque along the posterior wall without evidence of ulcerations.

No evidence of intraluminal filling defects is seen either.

More distally, the right internal carotid artery is seen to opacify
to the cranial skull base. The petrous, cavernous and the
supraclinoid segments are widely patent.

The right posterior communicating artery is seen opacifying the
right posterior cerebral artery, and transiently bilateral superior
cerebellar arteries.

The right middle cerebral artery and the right anterior cerebral
artery opacify into the capillary and venous phases.

A soft plaque is seen in the right middle cerebral artery
proximally.

The left common carotid arteriogram demonstrates the left external
carotid artery and its major branches to be widely patent.

The left internal carotid artery at the bulb has a smooth shallow
plaque along the medial aspect with approximately 10% stenosis by
the NASCET criteria.

Mild tortuosity is seen of the proximal [DATE] of the left internal
carotid artery.

More distally, the vessel assumes normal caliber to the cranial
skull base. The petrous, the cavernous and the supraclinoid segments
are widely patent.

The left posterior communicating artery is seen opacifying the left
posterior cerebral artery distribution. The left middle cerebral
artery at its origin and proximally demonstrates a 50% stenosis
secondary to a circumferential soft atherosclerotic plaque.

The left MCA branches, and the left anterior cerebral artery opacify
into the capillary and venous phases.

The right subclavian arteriogram demonstrates non visualization of
the left vertebral artery.

Ascending cervical branch of the thyrocervical trunk is noted to be
present.
IMPRESSION: Severe pre occlusive stenosis of the mid basilar artery.

Angiographically occluded left vertebral artery intracranially and
extracranially.

Approximately 50% atherosclerotic stenosis of the proximal left
middle cerebral artery M1 segment.

PLAN:
Endovascular treatment of mid basilar artery pre occlusive stenosis
in approximately 10 days.

## 2021-09-18 ENCOUNTER — Other Ambulatory Visit (HOSPITAL_COMMUNITY): Payer: Self-pay | Admitting: Nurse Practitioner

## 2021-09-18 DIAGNOSIS — I739 Peripheral vascular disease, unspecified: Secondary | ICD-10-CM

## 2021-09-26 ENCOUNTER — Encounter (HOSPITAL_COMMUNITY): Payer: Medicare (Managed Care)

## 2021-09-27 ENCOUNTER — Ambulatory Visit (HOSPITAL_COMMUNITY): Payer: Medicare Other

## 2021-10-02 ENCOUNTER — Ambulatory Visit (HOSPITAL_COMMUNITY)
Admission: RE | Admit: 2021-10-02 | Discharge: 2021-10-02 | Disposition: A | Discharge status: Home / Self Care | Payer: Medicare Other | Source: Ambulatory Visit | Attending: Nurse Practitioner | Admitting: Nurse Practitioner

## 2021-10-02 DIAGNOSIS — I739 Peripheral vascular disease, unspecified: Secondary | ICD-10-CM

## 2021-10-02 NOTE — Progress Notes (Signed)
VASCULAR LAB    ABI has been performed.  See CV proc for preliminary results.   Meryem Haertel, RVT 10/02/2021, 10:38 AM

## 2021-11-10 ENCOUNTER — Encounter (INDEPENDENT_AMBULATORY_CARE_PROVIDER_SITE_OTHER): Payer: Medicare Other | Admitting: Ophthalmology

## 2021-11-10 ENCOUNTER — Encounter (INDEPENDENT_AMBULATORY_CARE_PROVIDER_SITE_OTHER): Payer: Self-pay

## 2021-11-10 DIAGNOSIS — H3581 Retinal edema: Secondary | ICD-10-CM

## 2021-11-28 ENCOUNTER — Other Ambulatory Visit: Payer: Self-pay | Admitting: Nurse Practitioner

## 2021-11-28 DIAGNOSIS — G309 Alzheimer's disease, unspecified: Secondary | ICD-10-CM

## 2021-11-28 DIAGNOSIS — Z8673 Personal history of transient ischemic attack (TIA), and cerebral infarction without residual deficits: Secondary | ICD-10-CM

## 2021-12-27 ENCOUNTER — Ambulatory Visit
Admission: RE | Admit: 2021-12-27 | Discharge: 2021-12-27 | Disposition: A | Discharge status: Home / Self Care | Payer: Medicare Other | Source: Ambulatory Visit | Attending: Nurse Practitioner | Admitting: Nurse Practitioner

## 2021-12-27 DIAGNOSIS — F0280 Dementia in other diseases classified elsewhere without behavioral disturbance: Secondary | ICD-10-CM

## 2021-12-27 DIAGNOSIS — Z8673 Personal history of transient ischemic attack (TIA), and cerebral infarction without residual deficits: Secondary | ICD-10-CM

## 2023-07-20 ENCOUNTER — Encounter (HOSPITAL_COMMUNITY): Payer: Self-pay | Admitting: Interventional Radiology
